# Patient Record
Sex: Male | Born: 1945
Health system: Southern US, Community
[De-identification: ages and names within clinical notes are randomized; demographics above are authoritative.]

## PROBLEM LIST (undated history)

## (undated) DIAGNOSIS — K219 Gastro-esophageal reflux disease without esophagitis: Secondary | ICD-10-CM

## (undated) DIAGNOSIS — J40 Bronchitis, not specified as acute or chronic: Secondary | ICD-10-CM

## (undated) DIAGNOSIS — E119 Type 2 diabetes mellitus without complications: Secondary | ICD-10-CM

## (undated) DIAGNOSIS — I10 Essential (primary) hypertension: Secondary | ICD-10-CM

## (undated) HISTORY — PX: CATARACT EXTRACTION, BILATERAL: SHX1313

---

## 2003-11-27 ENCOUNTER — Emergency Department (HOSPITAL_COMMUNITY): Admission: EM | Admit: 2003-11-27 | Discharge: 2003-11-28 | Payer: Self-pay | Admitting: Emergency Medicine

## 2005-02-24 ENCOUNTER — Ambulatory Visit (HOSPITAL_COMMUNITY): Admission: RE | Admit: 2005-02-24 | Discharge: 2005-02-24 | Payer: Self-pay | Admitting: Orthopaedic Surgery

## 2005-03-08 ENCOUNTER — Encounter: Admission: RE | Admit: 2005-03-08 | Discharge: 2005-03-08 | Payer: Self-pay | Admitting: Orthopaedic Surgery

## 2005-10-25 ENCOUNTER — Emergency Department (HOSPITAL_COMMUNITY): Admission: EM | Admit: 2005-10-25 | Discharge: 2005-10-25 | Payer: Self-pay | Admitting: Emergency Medicine

## 2006-08-30 ENCOUNTER — Emergency Department (HOSPITAL_COMMUNITY): Admission: EM | Admit: 2006-08-30 | Discharge: 2006-08-30 | Payer: Self-pay | Admitting: Emergency Medicine

## 2008-12-29 ENCOUNTER — Emergency Department (HOSPITAL_COMMUNITY): Admission: EM | Admit: 2008-12-29 | Discharge: 2008-12-30 | Payer: Self-pay | Admitting: Emergency Medicine

## 2009-03-25 ENCOUNTER — Emergency Department (HOSPITAL_COMMUNITY): Admission: EM | Admit: 2009-03-25 | Discharge: 2009-03-25 | Payer: Self-pay | Admitting: Emergency Medicine

## 2009-04-02 ENCOUNTER — Emergency Department (HOSPITAL_COMMUNITY): Admission: EM | Admit: 2009-04-02 | Discharge: 2009-04-02 | Payer: Self-pay | Admitting: Emergency Medicine

## 2010-08-24 ENCOUNTER — Encounter: Payer: Self-pay | Admitting: Orthopaedic Surgery

## 2010-11-08 LAB — BRAIN NATRIURETIC PEPTIDE: Pro B Natriuretic peptide (BNP): 37 pg/mL (ref 0.0–100.0)

## 2010-11-08 LAB — URINALYSIS, ROUTINE W REFLEX MICROSCOPIC
Glucose, UA: NEGATIVE mg/dL
Hgb urine dipstick: NEGATIVE
Ketones, ur: 15 mg/dL — AB
Protein, ur: NEGATIVE mg/dL

## 2010-11-08 LAB — POCT CARDIAC MARKERS
CKMB, poc: <1 ng/mL — ABNORMAL LOW (ref 1.0–8.0)
CKMB, poc: <1 ng/mL — ABNORMAL LOW (ref 1.0–8.0)
Myoglobin, poc: 39 ng/mL (ref 12–200)
Troponin i, poc: <0.05 ng/mL (ref 0.00–0.09)

## 2010-11-08 LAB — DIFFERENTIAL
Basophils Absolute: 0 10*3/uL (ref 0.0–0.1)
Basophils Relative: 0 % (ref 0–1)
Lymphocytes Relative: 19 % (ref 12–46)
Lymphs Abs: 1.3 10*3/uL (ref 0.7–4.0)
Neutro Abs: 5.1 10*3/uL (ref 1.7–7.7)
Neutrophils Relative %: 75 % (ref 43–77)

## 2010-11-08 LAB — COMPREHENSIVE METABOLIC PANEL
ALT: 15 U/L (ref 0–53)
Albumin: 3.8 g/dL (ref 3.5–5.2)
Alkaline Phosphatase: 61 U/L (ref 39–117)
Calcium: 8.9 mg/dL (ref 8.4–10.5)
Chloride: 105 mEq/L (ref 96–112)
GFR calc non Af Amer: >60 mL/min (ref >60)
Total Bilirubin: 1.1 mg/dL (ref 0.3–1.2)
Total Protein: 7.7 g/dL (ref 6.0–8.3)

## 2010-11-08 LAB — CBC
Hemoglobin: 12.5 g/dL — ABNORMAL LOW (ref 13.0–17.0)
MCHC: 34.1 g/dL (ref 30.0–36.0)
MCV: 96.1 fL (ref 78.0–100.0)
WBC: 6.9 10*3/uL (ref 4.0–10.5)

## 2010-11-08 LAB — URINE CULTURE: Colony Count: NO GROWTH

## 2012-08-08 ENCOUNTER — Ambulatory Visit (INDEPENDENT_AMBULATORY_CARE_PROVIDER_SITE_OTHER): Payer: Medicare Other | Admitting: Family Medicine

## 2012-08-08 VITALS — BP 155/80 | HR 83 | Temp 98.0°F | Resp 16

## 2012-08-08 DIAGNOSIS — I10 Essential (primary) hypertension: Secondary | ICD-10-CM

## 2012-08-08 DIAGNOSIS — J40 Bronchitis, not specified as acute or chronic: Secondary | ICD-10-CM

## 2012-08-08 DIAGNOSIS — K219 Gastro-esophageal reflux disease without esophagitis: Secondary | ICD-10-CM | POA: Insufficient documentation

## 2012-08-08 MED ORDER — AZITHROMYCIN 200 MG/5ML PO SUSR: ORAL | Status: DC

## 2012-08-08 NOTE — Patient Instructions (Addendum)
Please let us know if you are not better in the next few days- Sooner if worse.

## 2012-08-08 NOTE — Progress Notes (Signed)
[facility] [address], [place] [address] [phone]  Date:  08/08/2012   Name:  Ethan Pierce   DOB:  02-13-1946   MRN:  [id number]  PCP:  No primary provider on file.    Chief Complaint: Cough and Nasal Congestion   History of Present Illness:  Ethan Pierce is a 67 y.o. very pleasant male patient who presents with the following:  Here today with illness.  He has noted a cough and thinks he may have bronchitis.  He has had the cough for about a week.   He does not feel bad overall, but his back may hurt when he coughs.   No fever, chills or other body aches.   He does have some nasal congestion and is using an OTC nasal spray.  No earache or ST.    He has tried some OTC medications but they have not helped much so far.    He would like a PSA test as there is a family history- however he plans to have this done at the [facility] next week.  He gets most of her regular care at the [facility].    There is no problem list on file for this patient.   History reviewed. No pertinent past medical history.  History reviewed. No pertinent past surgical history.  History  Substance Use Topics  . Smoking status: Never Smoker   . Smokeless tobacco: Not on file  . Alcohol Use: No    Family History  Problem Relation Age of Onset  . Diabetes Mother   . GER disease Mother   . Cancer Father     pancreas  . Diabetes Sister   . Diabetes Brother     Allergies  Allergen Reactions  . Penicillins   . Talwin (Pentazocine) Other (See Comments)    Elevated blood pressure.    Medication list has been reviewed and updated.  Current Outpatient Prescriptions on File Prior to Visit  Medication Sig Dispense Refill  . lansoprazole (PREVACID SOLUTAB) 15 MG disintegrating tablet Take 15 mg by mouth 2 (two) times daily.      Marland Kitchen lisinopril (PRINIVIL,ZESTRIL) 40 MG tablet Take 40 mg by mouth daily.        Review of Systems:  As per HPI- otherwise negative.   Physical  Examination: Filed Vitals:   08/08/12 0906  BP: 172/74  Pulse: 83  Temp: 98 F (36.7 C)  Resp: 16   There were no vitals filed for this visit. There is no height or weight on file to calculate BMI. Ideal Body Weight:    GEN: WDWN, NAD, Non-toxic, A & O x 3 HEENT: Atraumatic, Normocephalic. Neck supple. No masses, No LAD. Bilateral TM wnl, oropharynx normal.  PEERL,EOMI.   Ears and Nose: No external deformity. CV: RRR, No M/G/R. No JVD. No thrill. No extra heart sounds. PULM: CTA B, no wheezes, crackles, rhonchi. No retractions. No resp. distress. No accessory muscle use. EXTR: No c/c/e NEURO Normal gait.  PSYCH: Normally interactive. Conversant. Not depressed or anxious appearing.  Calm demeanor.    Assessment and Plan: 1. Bronchitis  azithromycin (ZITHROMAX) 200 MG/5ML suspension  2. HTN (hypertension)    3. GERD (gastroesophageal reflux disease)     Will treat for bronchitis as above- he states that he cannot take pills easily, so will use azithromycin liquid  Meds ordered this encounter  Medications  . lisinopril (PRINIVIL,ZESTRIL) 40 MG tablet    Sig: Take 40 mg  by mouth daily.  . lansoprazole (PREVACID SOLUTAB) 15 MG disintegrating tablet    Sig: Take 15 mg by mouth 2 (two) times daily.  Marland Kitchen azithromycin (ZITHROMAX) 200 MG/5ML suspension    Sig: 500 mg day 1 (12.5 ml), then 250 mg days 2-5 (6.25 ml)    Dispense:  40 mL    Refill:  0   Let me know if not better in the next few days.    Abbe Amsterdam, MD

## 2012-09-09 ENCOUNTER — Emergency Department (HOSPITAL_COMMUNITY): Payer: Medicare Other

## 2012-09-09 ENCOUNTER — Encounter (HOSPITAL_COMMUNITY): Payer: Self-pay | Admitting: Emergency Medicine

## 2012-09-09 ENCOUNTER — Emergency Department (HOSPITAL_COMMUNITY)
Admission: EM | Admit: 2012-09-09 | Discharge: 2012-09-09 | Disposition: A | Discharge status: Home / Self Care | Payer: Medicare Other | Attending: Emergency Medicine | Admitting: Emergency Medicine

## 2012-09-09 DIAGNOSIS — K219 Gastro-esophageal reflux disease without esophagitis: Secondary | ICD-10-CM | POA: Insufficient documentation

## 2012-09-09 DIAGNOSIS — M545 Low back pain, unspecified: Secondary | ICD-10-CM | POA: Insufficient documentation

## 2012-09-09 DIAGNOSIS — N200 Calculus of kidney: Secondary | ICD-10-CM

## 2012-09-09 DIAGNOSIS — K5730 Diverticulosis of large intestine without perforation or abscess without bleeding: Secondary | ICD-10-CM | POA: Insufficient documentation

## 2012-09-09 DIAGNOSIS — Z79899 Other long term (current) drug therapy: Secondary | ICD-10-CM | POA: Insufficient documentation

## 2012-09-09 DIAGNOSIS — I10 Essential (primary) hypertension: Secondary | ICD-10-CM | POA: Insufficient documentation

## 2012-09-09 DIAGNOSIS — R112 Nausea with vomiting, unspecified: Secondary | ICD-10-CM | POA: Insufficient documentation

## 2012-09-09 DIAGNOSIS — E119 Type 2 diabetes mellitus without complications: Secondary | ICD-10-CM | POA: Insufficient documentation

## 2012-09-09 HISTORY — DX: Type 2 diabetes mellitus without complications: E11.9

## 2012-09-09 HISTORY — DX: Diverticulosis of large intestine without perforation or abscess without bleeding: K57.30

## 2012-09-09 HISTORY — DX: Essential (primary) hypertension: I10

## 2012-09-09 HISTORY — DX: Gastro-esophageal reflux disease without esophagitis: K21.9

## 2012-09-09 LAB — BASIC METABOLIC PANEL
Calcium: 8.7 mg/dL (ref 8.4–10.5)
Creatinine, Ser: 0.85 mg/dL (ref 0.50–1.35)
GFR calc Af Amer: >90 mL/min (ref >90)
GFR calc non Af Amer: 89 mL/min — ABNORMAL LOW (ref >90)
Glucose, Bld: 210 mg/dL — ABNORMAL HIGH (ref 70–99)
Potassium: 3.6 mEq/L (ref 3.5–5.1)
Sodium: 136 mEq/L (ref 135–145)

## 2012-09-09 LAB — CBC WITH DIFFERENTIAL/PLATELET
Basophils Absolute: 0 10*3/uL (ref 0.0–0.1)
Basophils Relative: 1 % (ref 0–1)
Eosinophils Absolute: 0.2 10*3/uL (ref 0.0–0.7)
MCH: 32.4 pg (ref 26.0–34.0)
MCHC: 35 g/dL (ref 30.0–36.0)
MCV: 92.7 fL (ref 78.0–100.0)
Monocytes Relative: 7 % (ref 3–12)
Neutro Abs: 3.8 10*3/uL (ref 1.7–7.7)
Neutrophils Relative %: 58 % (ref 43–77)
RDW: 12.4 % (ref 11.5–15.5)
WBC: 6.6 10*3/uL (ref 4.0–10.5)

## 2012-09-09 LAB — URINALYSIS, ROUTINE W REFLEX MICROSCOPIC
Leukocytes, UA: NEGATIVE
pH: 5.5 (ref 5.0–8.0)

## 2012-09-09 MED ORDER — PROMETHAZINE HCL 25 MG PO TABS: 25.0000 mg | ORAL_TABLET | Freq: Four times a day (QID) | ORAL | Status: DC | PRN

## 2012-09-09 MED ORDER — TAMSULOSIN HCL 0.4 MG PO CAPS: 0.4000 mg | ORAL_CAPSULE | Freq: Two times a day (BID) | ORAL | Status: DC

## 2012-09-09 MED ORDER — ONDANSETRON 4 MG PO TBDP
8.0000 mg | ORAL_TABLET | Freq: Once | ORAL | Status: AC
  Administered 2012-09-09: 8 mg via ORAL
  Filled 2012-09-09: qty 2

## 2012-09-09 MED ORDER — HYDROCODONE-ACETAMINOPHEN 5-325 MG PO TABS: 2.0000 | ORAL_TABLET | ORAL | Status: DC | PRN

## 2012-09-09 MED ORDER — NAPROXEN 500 MG PO TABS: 500.0000 mg | ORAL_TABLET | Freq: Two times a day (BID) | ORAL | Status: DC

## 2012-09-09 NOTE — ED Notes (Signed)
Pt reports left flank pain after lifting a box; pt states he started having some nausea and vomiting after this incident;

## 2012-09-09 NOTE — ED Provider Notes (Signed)
History     CSN: [id number]  Arrival date & time 09/09/12  1538   First MD Initiated Contact with Patient 09/09/12 1649      Chief Complaint  Patient presents with  . Flank Pain    (Consider location/radiation/quality/duration/timing/severity/associated sxs/prior treatment) HPI Comments: 67 year old male with a history of hypertension and diabetes presents with a complaint of left lower back pain that radiates to the left flank and groin. He states this started today, it was gradual in onset, it has been persistent, he seems to think that it is worse when he is standing, better when he is resting on his back. He does have associated nausea and vomiting occasionally with this. He denies hematuria, dysuria and has never had a kidney stone. He has never had any abdominal surgery.  Patient is a 67 y.o. male presenting with flank pain. The history is provided by the patient and a relative.  Flank Pain    Past Medical History  Diagnosis Date  . Hypertension   . Diabetes mellitus without complication   . GERD (gastroesophageal reflux disease)     History reviewed. No pertinent past surgical history.  Family History  Problem Relation Age of Onset  . Diabetes Mother   . GER disease Mother   . Cancer Father     pancreas  . Diabetes Sister   . Diabetes Brother     History  Substance Use Topics  . Smoking status: Never Smoker   . Smokeless tobacco: Not on file  . Alcohol Use: No      Review of Systems  Genitourinary: Positive for flank pain.  All other systems reviewed and are negative.    Allergies  Penicillins and Talwin  Home Medications   Current Outpatient Rx  Name  Route  Sig  Dispense  Refill  . ENSURE PLUS PO LIQD   Oral   Take 237 mLs by mouth 3 (three) times daily between meals.         Marland Kitchen LANSOPRAZOLE 15 MG PO TBDP   Oral   Take 15 mg by mouth 2 (two) times daily.         Marland Kitchen LISINOPRIL 40 MG PO TABS   Oral   Take 40 mg by mouth daily as needed.  For hypertension         . SODIUM CHLORIDE 0.65 % NA SOLN   Nasal   Place 1 spray into the nose as needed. For nasal congestion         . HYDROCODONE-ACETAMINOPHEN 5-325 MG PO TABS   Oral   Take 2 tablets by mouth every 4 (four) hours as needed for pain.   15 tablet   0   . NAPROXEN 500 MG PO TABS   Oral   Take 1 tablet (500 mg total) by mouth 2 (two) times daily with a meal.   30 tablet   0   . PROMETHAZINE HCL 25 MG PO TABS   Oral   Take 1 tablet (25 mg total) by mouth every 6 (six) hours as needed for nausea.   12 tablet   0   . TAMSULOSIN HCL 0.4 MG PO CAPS   Oral   Take 1 capsule (0.4 mg total) by mouth 2 (two) times daily.   10 capsule   0     BP 164/70  Pulse 58  Temp 98.4 F (36.9 C) (Oral)  Resp 18  SpO2 97%  Physical Exam  Nursing note and vitals reviewed. Constitutional: He  appears well-developed and well-nourished. No distress.  HENT:  Head: Normocephalic and atraumatic.  Mouth/Throat: Oropharynx is clear and moist. No oropharyngeal exudate.  Eyes: Conjunctivae normal and EOM are normal. Pupils are equal, round, and reactive to light. Right eye exhibits no discharge. Left eye exhibits no discharge. No scleral icterus.  Neck: Normal range of motion. Neck supple. No JVD present. No thyromegaly present.  Cardiovascular: Normal rate, regular rhythm, normal heart sounds and intact distal pulses.  Exam reveals no gallop and no friction rub.   No murmur heard. Pulmonary/Chest: Effort normal and breath sounds normal. No respiratory distress. He has no wheezes. He has no rales.  Abdominal: Soft. Bowel sounds are normal. He exhibits no distension and no mass. There is tenderness ( Minimal tenderness to the left lower quadrant, no pulsating masses, no peritoneal signs).  Musculoskeletal: Normal range of motion. He exhibits no edema and no tenderness.  Lymphadenopathy:    He has no cervical adenopathy.  Neurological: He is alert. Coordination normal.  Skin:  Skin is warm and dry. No rash noted. No erythema.  Psychiatric: He has a normal mood and affect. His behavior is normal.    ED Course  Procedures (including critical care time)  Labs Reviewed  CBC WITH DIFFERENTIAL - Abnormal; Notable for the following:    RBC 3.70 (*)     Hemoglobin 12.0 (*)     HCT 34.3 (*)     All other components within normal limits  BASIC METABOLIC PANEL - Abnormal; Notable for the following:    Glucose, Bld 210 (*)     GFR calc non Af Amer 89 (*)     All other components within normal limits  URINALYSIS, ROUTINE W REFLEX MICROSCOPIC   Ct Abdomen Pelvis Wo Contrast  09/09/2012  *RADIOLOGY REPORT*  Clinical Data: Left flank pain and left lower quadrant - left scrotal pain  CT ABDOMEN AND PELVIS WITHOUT CONTRAST  Technique:  Multidetector CT imaging of the abdomen and pelvis was performed following the standard protocol without intravenous contrast.  Comparison: None.  Findings: The lung bases are clear.  The heart is mildly enlarged. The liver is unremarkable in the unenhanced state.  No calcified gallstones are seen within the contracted gallbladder.  The pancreas is normal in size and the pancreatic duct is not dilated. The adrenal glands and spleen are unremarkable.  The stomach is not well distended.  No renal calculi are seen.  There is however slight fullness of the left pelvocaliceal system and left ureter to a point of low grade obstruction by a 3 mm distal left ureteral calculus several centimeters above the expected left UV junction. The right ureter is normal in caliber.  The urinary bladder is unremarkable.  The prostate is normal in size for age.  There are multiple rectosigmoid colonic diverticula present.  No diverticulitis is seen.  The terminal ileum and the appendix are unremarkable.  There is degenerative change throughout the facet joints of the lower lumbar spine.  IMPRESSION:  1.  Low grade obstruction of the left collecting system by a 3 mm distal left  ureteral calculus several centimeters above the left UV junction. 2.  No additional renal calculi are seen. 3.  Multiple rectosigmoid colonic diverticula.   Original Report Authenticated By: Dwyane Dee, M.D.      1. Kidney stone on left side       MDM  The patient is well-appearing, he does not have any signs of hernia in his groins, I suspect  he may have a kidney stone, would also consider diverticulitis and less likely an aneurysm. CT scan has been ordered as his lab work is completely unremarkable including no hematuria. He declines pain medications at this   CT scan shows a 3 mm distal UVJ kidney stone causing some obstruction. Renal function is preserved, no leukocytosis, no infection or hematuria. The patient appears stable, will discharge the     Vida Roller, MD 09/09/12 [phone]

## 2012-09-09 NOTE — ED Notes (Signed)
Pt c/o lower back pain with radiation to left flank and groin area with N/V

## 2012-09-09 NOTE — ED Notes (Signed)
Pt given discharge paperwork; pt verbalized understanding of discharge and f/u; no additional questions by pt; VSS;

## 2012-11-09 ENCOUNTER — Ambulatory Visit: Payer: Non-veteran care | Attending: Internal Medicine | Admitting: Physical Therapy

## 2012-11-09 DIAGNOSIS — IMO0001 Reserved for inherently not codable concepts without codable children: Secondary | ICD-10-CM | POA: Insufficient documentation

## 2012-11-09 DIAGNOSIS — M545 Low back pain, unspecified: Secondary | ICD-10-CM | POA: Insufficient documentation

## 2012-11-09 DIAGNOSIS — M25659 Stiffness of unspecified hip, not elsewhere classified: Secondary | ICD-10-CM | POA: Insufficient documentation

## 2012-11-16 ENCOUNTER — Ambulatory Visit: Payer: Non-veteran care | Admitting: Physical Therapy

## 2012-11-21 ENCOUNTER — Ambulatory Visit: Payer: Non-veteran care | Admitting: Physical Therapy

## 2012-11-23 ENCOUNTER — Ambulatory Visit: Payer: Non-veteran care | Admitting: Physical Therapy

## 2012-12-06 ENCOUNTER — Ambulatory Visit: Payer: Non-veteran care | Attending: Internal Medicine | Admitting: Physical Therapy

## 2012-12-06 DIAGNOSIS — M545 Low back pain, unspecified: Secondary | ICD-10-CM | POA: Insufficient documentation

## 2012-12-06 DIAGNOSIS — IMO0001 Reserved for inherently not codable concepts without codable children: Secondary | ICD-10-CM | POA: Insufficient documentation

## 2012-12-06 DIAGNOSIS — M25659 Stiffness of unspecified hip, not elsewhere classified: Secondary | ICD-10-CM | POA: Insufficient documentation

## 2012-12-08 ENCOUNTER — Ambulatory Visit: Payer: Non-veteran care | Admitting: Physical Therapy

## 2012-12-13 ENCOUNTER — Ambulatory Visit: Payer: Non-veteran care | Admitting: Physical Therapy

## 2012-12-15 ENCOUNTER — Ambulatory Visit: Payer: Non-veteran care | Admitting: Physical Therapy

## 2012-12-21 ENCOUNTER — Ambulatory Visit: Payer: Non-veteran care | Admitting: Physical Therapy

## 2012-12-23 ENCOUNTER — Ambulatory Visit: Payer: Non-veteran care | Admitting: Physical Therapy

## 2013-01-02 ENCOUNTER — Ambulatory Visit: Payer: Non-veteran care | Attending: Internal Medicine | Admitting: Physical Therapy

## 2013-01-02 DIAGNOSIS — M25659 Stiffness of unspecified hip, not elsewhere classified: Secondary | ICD-10-CM | POA: Insufficient documentation

## 2013-01-02 DIAGNOSIS — M545 Low back pain, unspecified: Secondary | ICD-10-CM | POA: Insufficient documentation

## 2013-01-02 DIAGNOSIS — IMO0001 Reserved for inherently not codable concepts without codable children: Secondary | ICD-10-CM | POA: Insufficient documentation

## 2013-01-05 ENCOUNTER — Ambulatory Visit: Payer: Non-veteran care | Admitting: Physical Therapy

## 2013-01-17 ENCOUNTER — Ambulatory Visit: Payer: Non-veteran care | Admitting: Physical Therapy

## 2013-11-26 ENCOUNTER — Encounter (HOSPITAL_COMMUNITY): Payer: Self-pay | Admitting: Emergency Medicine

## 2013-11-26 ENCOUNTER — Emergency Department (HOSPITAL_COMMUNITY): Payer: Medicare Other

## 2013-11-26 ENCOUNTER — Emergency Department (HOSPITAL_COMMUNITY)
Admission: EM | Admit: 2013-11-26 | Discharge: 2013-11-26 | Disposition: A | Discharge status: Home / Self Care | Payer: Medicare Other | Attending: Emergency Medicine | Admitting: Emergency Medicine

## 2013-11-26 DIAGNOSIS — K219 Gastro-esophageal reflux disease without esophagitis: Secondary | ICD-10-CM | POA: Diagnosis not present

## 2013-11-26 DIAGNOSIS — Z791 Long term (current) use of non-steroidal anti-inflammatories (NSAID): Secondary | ICD-10-CM | POA: Diagnosis not present

## 2013-11-26 DIAGNOSIS — E119 Type 2 diabetes mellitus without complications: Secondary | ICD-10-CM | POA: Insufficient documentation

## 2013-11-26 DIAGNOSIS — Z79899 Other long term (current) drug therapy: Secondary | ICD-10-CM | POA: Diagnosis not present

## 2013-11-26 DIAGNOSIS — Z880 Allergy status to penicillin: Secondary | ICD-10-CM | POA: Insufficient documentation

## 2013-11-26 DIAGNOSIS — J019 Acute sinusitis, unspecified: Secondary | ICD-10-CM | POA: Diagnosis not present

## 2013-11-26 DIAGNOSIS — I10 Essential (primary) hypertension: Secondary | ICD-10-CM | POA: Diagnosis present

## 2013-11-26 LAB — COMPREHENSIVE METABOLIC PANEL
ALK PHOS: 88 U/L (ref 39–117)
ALT: 12 U/L (ref 0–53)
AST: 18 U/L (ref 0–37)
Albumin: 3.5 g/dL (ref 3.5–5.2)
BILIRUBIN TOTAL: 0.6 mg/dL (ref 0.3–1.2)
BUN: 13 mg/dL (ref 6–23)
CHLORIDE: 102 meq/L (ref 96–112)
CO2: 26 meq/L (ref 19–32)
Calcium: 8.8 mg/dL (ref 8.4–10.5)
Creatinine, Ser: 0.99 mg/dL (ref 0.50–1.35)
GFR calc Af Amer: >90 mL/min (ref >90)
GFR calc non Af Amer: 83 mL/min — ABNORMAL LOW (ref >90)
GLUCOSE: 201 mg/dL — AB (ref 70–99)
POTASSIUM: 3.8 meq/L (ref 3.7–5.3)
Sodium: 140 mEq/L (ref 137–147)
Total Protein: 7.8 g/dL (ref 6.0–8.3)

## 2013-11-26 LAB — CBC WITH DIFFERENTIAL/PLATELET
BASOS PCT: 1 % (ref 0–1)
Basophils Absolute: 0 10*3/uL (ref 0.0–0.1)
Eosinophils Absolute: 0.4 10*3/uL (ref 0.0–0.7)
Eosinophils Relative: 6 % — ABNORMAL HIGH (ref 0–5)
HEMATOCRIT: 37.5 % — AB (ref 39.0–52.0)
HEMOGLOBIN: 12.6 g/dL — AB (ref 13.0–17.0)
LYMPHS PCT: 27 % (ref 12–46)
Lymphs Abs: 1.9 10*3/uL (ref 0.7–4.0)
MCH: 31.3 pg (ref 26.0–34.0)
MCHC: 33.6 g/dL (ref 30.0–36.0)
MCV: 93.3 fL (ref 78.0–100.0)
MONOS PCT: 10 % (ref 3–12)
Monocytes Absolute: 0.7 10*3/uL (ref 0.1–1.0)
NEUTROS ABS: 3.9 10*3/uL (ref 1.7–7.7)
Neutrophils Relative %: 56 % (ref 43–77)
Platelets: 220 10*3/uL (ref 150–400)
RBC: 4.02 MIL/uL — AB (ref 4.22–5.81)
RDW: 12.7 % (ref 11.5–15.5)
WBC: 6.8 10*3/uL (ref 4.0–10.5)

## 2013-11-26 LAB — TROPONIN I: Troponin I: <0.3 ng/mL (ref <0.30)

## 2013-11-26 MED ORDER — BENZONATATE 100 MG PO CAPS: 100.0000 mg | ORAL_CAPSULE | Freq: Three times a day (TID) | ORAL | Status: DC

## 2013-11-26 MED ORDER — SULFAMETHOXAZOLE-TMP DS 800-160 MG PO TABS: 1.0000 | ORAL_TABLET | Freq: Two times a day (BID) | ORAL | Status: DC

## 2013-11-26 NOTE — ED Provider Notes (Signed)
CSN: [id number]     Arrival date & time 11/26/13  [id number] History   First MD Initiated Contact with Patient 11/26/13 [phone]     Chief Complaint  Patient presents with  . Hypertension     (Consider location/radiation/quality/duration/timing/severity/associated sxs/prior Treatment) Patient is a 68 y.o. male presenting with hypertension. The history is provided by the patient. No language interpreter was used.  Hypertension This is a new problem. The current episode started today. The problem occurs constantly. The problem has been unchanged. Associated symptoms include congestion and coughing. Pertinent negatives include no nausea. Nothing aggravates the symptoms. He has tried nothing for the symptoms. The treatment provided mild relief.    Past Medical History  Diagnosis Date  . Hypertension   . Diabetes mellitus without complication   . GERD (gastroesophageal reflux disease)    History reviewed. No pertinent past surgical history. Family History  Problem Relation Age of Onset  . Diabetes Mother   . GER disease Mother   . Cancer Father     pancreas  . Diabetes Sister   . Diabetes Brother    History  Substance Use Topics  . Smoking status: Never Smoker   . Smokeless tobacco: Not on file  . Alcohol Use: No    Review of Systems  HENT: Positive for congestion.   Respiratory: Positive for cough.   Gastrointestinal: Negative for nausea.  All other systems reviewed and are negative.     Allergies  Penicillins and Talwin  Home Medications   Prior to Admission medications   Medication Sig Start Date End Date Taking? Authorizing Provider  Ensure Plus (ENSURE PLUS) LIQD Take 237 mLs by mouth 3 (three) times daily between meals.    Historical Provider, MD  HYDROcodone-acetaminophen (NORCO/VICODIN) 5-325 MG per tablet Take 2 tablets by mouth every 4 (four) hours as needed for pain. 09/09/12   Vida RollerBrian D Miller, MD  lansoprazole (PREVACID SOLUTAB) 15 MG disintegrating tablet Take 15 mg  by mouth 2 (two) times daily.    Historical Provider, MD  lisinopril (PRINIVIL,ZESTRIL) 40 MG tablet Take 40 mg by mouth daily as needed. For hypertension    Historical Provider, MD  naproxen (NAPROSYN) 500 MG tablet Take 1 tablet (500 mg total) by mouth 2 (two) times daily with a meal. 09/09/12   Vida RollerBrian D Miller, MD  promethazine (PHENERGAN) 25 MG tablet Take 1 tablet (25 mg total) by mouth every 6 (six) hours as needed for nausea. 09/09/12   Vida RollerBrian D Miller, MD  sodium chloride (OCEAN) 0.65 % nasal spray Place 1 spray into the nose as needed. For nasal congestion    Historical Provider, MD  Tamsulosin HCl (FLOMAX) 0.4 MG CAPS Take 1 capsule (0.4 mg total) by mouth 2 (two) times daily. 09/09/12   Vida RollerBrian D Miller, MD   BP 191/87  Pulse 109  Temp(Src) 98.5 F (36.9 C) (Oral)  Resp 20  Ht 5\' 8"  (1.727 m)  Wt 180 lb (81.647 kg)  BMI 27.38 kg/m2  SpO2 98% Physical Exam  Nursing note and vitals reviewed. Constitutional: He is oriented to person, place, and time. He appears well-developed and well-nourished.  HENT:  Head: Normocephalic.  Right Ear: External ear normal.  Left Ear: External ear normal.  Mouth/Throat: Oropharynx is clear and moist.  Eyes: Conjunctivae are normal. Pupils are equal, round, and reactive to light.  Neck: Normal range of motion. Neck supple.  Cardiovascular: Normal rate and regular rhythm.   Pulmonary/Chest: Effort normal and breath sounds normal.  Abdominal: Soft.  Bowel sounds are normal.  Musculoskeletal: Normal range of motion.  Neurological: He is alert and oriented to person, place, and time.  Skin: Skin is warm.  Psychiatric: He has a normal mood and affect.    ED Course  Procedures (including critical care time) Labs Review Labs Reviewed  CBC WITH DIFFERENTIAL  COMPREHENSIVE METABOLIC PANEL  TROPONIN I    Imaging Review No results found.   EKG Interpretation   Date/Time:  Sunday November 26 2013 06:10:15 EDT Ventricular Rate:  99 PR Interval:   209 QRS Duration: 100 QT Interval:  380 QTC Calculation: 488 R Axis:   77 Text Interpretation:  Sinus rhythm Borderline prolonged PR interval  Probable left atrial enlargement ECG OTHERWISE WITHIN NORMAL LIMITS since  last tracing no significant change Confirmed by MILLER  MD, BRIAN ([id number])  on 11/26/2013 6:18:35 AM    . Labs Reviewed  CBC WITH DIFFERENTIAL - Abnormal; Notable for the following:    RBC 4.02 (*)    Hemoglobin 12.6 (*)    HCT 37.5 (*)    Eosinophils Relative 6 (*)    All other components within normal limits  COMPREHENSIVE METABOLIC PANEL - Abnormal; Notable for the following:    Glucose, Bld 201 (*)    GFR calc non Af Amer 83 (*)    All other components within normal limits  TROPONIN I    MDM ct shows sinusitis   Chest xray is normal.  Pt advised to follow up with primary MD.   Stop OTC cough medications.   Rx for zithromax   Final diagnoses:  Acute sinus infection        Elson AreasLeslie K Sofia, PA-C 11/26/13 0750  Lonia SkinnerLeslie K ConcordSofia, New JerseyPA-C 11/26/13 [phone]

## 2013-11-26 NOTE — ED Provider Notes (Signed)
The patient presents with a complaint of hypertension, had taken oral antitussive medication last night, noticed his blood pressure was high this morning, is very concerned because of coughing and brief headache which has now essentially resolved. On exam the patient is alert and oriented, moves all extremities without difficulty, speech is clear, cranial nerves III through XII are intact, heart and lung sounds are normal, abdomen soft and no peripheral edema. I personally seen and interpreted his 2 view PA and lateral views of the chest x-ray showed no signs of pulmonary edema or pulmonary infiltrate, CT scan of the head reveals no signs of intracranial hemorrhage mass or lesions. The patient has some lab work pending, he will be stable for discharge assuming all tests are normal, he is taking his antihypertensives this morning and is aware that he should stay away from over-the-counter medications that may cause increased blood pressure.  Medical screening examination/treatment/procedure(s) were conducted as a shared visit with non-physician practitioner(s) and myself.  I personally evaluated the patient during the encounter.  Clinical Impression: Hypertension      Vida RollerBrian D Cayli Escajeda, MD 11/26/13 [phone]

## 2013-11-26 NOTE — ED Notes (Signed)
Pt states he woke up this morning not feeling well, took blood pressure and it was high, as well as his pulse. Pt states he took his blood pressure pill ( Lisinipril). Pt states he took Robitussin for his bronchitis that he has been having issues all week. Pt denies any pain. Pt states "I don't feel normal".

## 2013-11-26 NOTE — ED Provider Notes (Signed)
Medical screening examination/treatment/procedure(s) were conducted as a shared visit with non-physician practitioner(s) and myself.  I personally evaluated the patient during the encounter  Please see my separate respective documentation pertaining to this patient encounter   Vida RollerBrian D Litisha Guagliardo, MD 11/26/13 2259

## 2013-11-26 NOTE — ED Notes (Signed)
Karen PA at bedside   

## 2013-11-26 NOTE — ED Notes (Signed)
The pt has bronchitis and he has been taking robitussin  His bp is high and he reports that  His heart ahs been beating fast

## 2013-11-26 NOTE — Discharge Instructions (Signed)

## 2014-07-17 ENCOUNTER — Emergency Department (HOSPITAL_COMMUNITY)
Admission: EM | Admit: 2014-07-17 | Discharge: 2014-07-17 | Disposition: A | Discharge status: Home / Self Care | Payer: Medicare Other | Attending: Emergency Medicine | Admitting: Emergency Medicine

## 2014-07-17 ENCOUNTER — Emergency Department (HOSPITAL_COMMUNITY): Payer: Medicare Other

## 2014-07-17 ENCOUNTER — Encounter (HOSPITAL_COMMUNITY): Payer: Self-pay | Admitting: Emergency Medicine

## 2014-07-17 DIAGNOSIS — R05 Cough: Secondary | ICD-10-CM | POA: Diagnosis not present

## 2014-07-17 DIAGNOSIS — I10 Essential (primary) hypertension: Secondary | ICD-10-CM | POA: Diagnosis present

## 2014-07-17 DIAGNOSIS — Z880 Allergy status to penicillin: Secondary | ICD-10-CM | POA: Insufficient documentation

## 2014-07-17 DIAGNOSIS — Z79899 Other long term (current) drug therapy: Secondary | ICD-10-CM | POA: Insufficient documentation

## 2014-07-17 DIAGNOSIS — K219 Gastro-esophageal reflux disease without esophagitis: Secondary | ICD-10-CM | POA: Diagnosis not present

## 2014-07-17 DIAGNOSIS — R059 Cough, unspecified: Secondary | ICD-10-CM

## 2014-07-17 DIAGNOSIS — E119 Type 2 diabetes mellitus without complications: Secondary | ICD-10-CM | POA: Insufficient documentation

## 2014-07-17 DIAGNOSIS — Z792 Long term (current) use of antibiotics: Secondary | ICD-10-CM | POA: Diagnosis not present

## 2014-07-17 LAB — COMPREHENSIVE METABOLIC PANEL
ALBUMIN: 3.7 g/dL (ref 3.5–5.2)
ALK PHOS: 87 U/L (ref 39–117)
ALT: 14 U/L (ref 0–53)
AST: 19 U/L (ref 0–37)
Anion gap: 13 (ref 5–15)
BILIRUBIN TOTAL: 0.4 mg/dL (ref 0.3–1.2)
BUN: 11 mg/dL (ref 6–23)
CO2: 28 mEq/L (ref 19–32)
CREATININE: 0.95 mg/dL (ref 0.50–1.35)
Calcium: 9.3 mg/dL (ref 8.4–10.5)
Chloride: 100 mEq/L (ref 96–112)
GFR calc Af Amer: >90 mL/min (ref >90)
GFR calc non Af Amer: 84 mL/min — ABNORMAL LOW (ref >90)
Glucose, Bld: 172 mg/dL — ABNORMAL HIGH (ref 70–99)
POTASSIUM: 3.4 meq/L — AB (ref 3.7–5.3)
Sodium: 141 mEq/L (ref 137–147)
TOTAL PROTEIN: 8 g/dL (ref 6.0–8.3)

## 2014-07-17 LAB — CBC
HCT: 37.5 % — ABNORMAL LOW (ref 39.0–52.0)
Hemoglobin: 12.2 g/dL — ABNORMAL LOW (ref 13.0–17.0)
MCH: 30 pg (ref 26.0–34.0)
MCHC: 32.5 g/dL (ref 30.0–36.0)
MCV: 92.1 fL (ref 78.0–100.0)
PLATELETS: 225 10*3/uL (ref 150–400)
RBC: 4.07 MIL/uL — ABNORMAL LOW (ref 4.22–5.81)
RDW: 12.7 % (ref 11.5–15.5)
WBC: 8.8 10*3/uL (ref 4.0–10.5)

## 2014-07-17 LAB — TROPONIN I: Troponin I: <0.3 ng/mL (ref <0.30)

## 2014-07-17 LAB — PRO B NATRIURETIC PEPTIDE: PRO B NATRI PEPTIDE: 14.5 pg/mL (ref 0–125)

## 2014-07-17 NOTE — ED Provider Notes (Addendum)
CSN: [id number]     Arrival date & time 07/17/14  0350 History   First MD Initiated Contact with Patient 07/17/14 [phone]     Chief Complaint  Patient presents with  . Hypertension  . Shortness of Breath     HPI Pt woke up with a choking episode tonight.  He was coughing. He does have acid reflux. When he woke up he didn't feel right so he checked his bp.  It was elevated in the 200s.  He did take his blood pressure medicine before coming in.  He has noticed that lying flat bothers him.  He is not sure if it is because of the gerd or something else.     Right now he is feeling better.  No CP.  No SOB at this time.  No leg swelling.  He has had some dry coughing recently. Past Medical History  Diagnosis Date  . Hypertension   . Diabetes mellitus without complication   . GERD (gastroesophageal reflux disease)    History reviewed. No pertinent past surgical history. Family History  Problem Relation Age of Onset  . Diabetes Mother   . GER disease Mother   . Cancer Father     pancreas  . Diabetes Sister   . Diabetes Brother    History  Substance Use Topics  . Smoking status: Never Smoker   . Smokeless tobacco: Never Used  . Alcohol Use: No    Review of Systems  All other systems reviewed and are negative.     Allergies  Penicillins; Decongestant; and Talwin  Home Medications   Prior to Admission medications   Medication Sig Start Date End Date Taking? Authorizing Provider  citalopram (CELEXA) 20 MG tablet Take 20 mg by mouth daily as needed (for anxiety).   Yes Historical Provider, MD  Ensure Plus (ENSURE PLUS) LIQD Take 237 mLs by mouth every other day.    Yes Historical Provider, MD  lansoprazole (PREVACID SOLUTAB) 15 MG disintegrating tablet Take 15 mg by mouth daily.    Yes Historical Provider, MD  lisinopril-hydrochlorothiazide (PRINZIDE,ZESTORETIC) 20-25 MG per tablet Take 1 tablet by mouth daily.   Yes Historical Provider, MD  PARoxetine (PAXIL) 20 MG tablet Take 20  mg by mouth daily.   Yes Historical Provider, MD  PRESCRIPTION MEDICATION Take 1 tablet by mouth daily. For bladder control   Yes Historical Provider, MD  Probiotic Product (ALIGN) 4 MG CAPS Take 4 mg by mouth daily.   Yes Historical Provider, MD  traZODone (DESYREL) 100 MG tablet Take 100 mg by mouth at bedtime as needed for sleep.   Yes Historical Provider, MD  benzonatate (TESSALON) 100 MG capsule Take 1 capsule (100 mg total) by mouth every 8 (eight) hours. Patient not taking: Reported on 07/17/2014 11/26/13   Elson AreasLeslie K Sofia, PA-C  citalopram (CELEXA) 20 MG tablet Take 20 mg by mouth daily.    Historical Provider, MD  guaifenesin (ROBITUSSIN) 100 MG/5ML syrup Take 200 mg by mouth 3 (three) times daily as needed for cough.    Historical Provider, MD  lisinopril (PRINIVIL,ZESTRIL) 40 MG tablet Take 40 mg by mouth daily as needed. For hypertension    Historical Provider, MD  sulfamethoxazole-trimethoprim (BACTRIM DS) 800-160 MG per tablet Take 1 tablet by mouth 2 (two) times daily. Patient not taking: Reported on 07/17/2014 11/26/13   Elson AreasLeslie K Sofia, PA-C   BP 128/60 mmHg  Pulse 73  Temp(Src) 98.3 F (36.8 C) (Oral)  Resp 13  Ht 5\' 8"  (  1.727 m)  Wt 180 lb (81.647 kg)  BMI 27.38 kg/m2  SpO2 97% Physical Exam  Constitutional: He appears well-developed and well-nourished. No distress.  HENT:  Head: Normocephalic and atraumatic.  Right Ear: External ear normal.  Left Ear: External ear normal.  Eyes: Conjunctivae are normal. Right eye exhibits no discharge. Left eye exhibits no discharge. No scleral icterus.  Neck: Neck supple. No tracheal deviation present.  Cardiovascular: Normal rate, regular rhythm and intact distal pulses.   Pulmonary/Chest: Effort normal and breath sounds normal. No stridor. No respiratory distress. He has no wheezes. He has no rales.  Abdominal: Soft. Bowel sounds are normal. He exhibits no distension. There is no tenderness. There is no rebound and no guarding.   Musculoskeletal: He exhibits no edema or tenderness.  Neurological: He is alert. He has normal strength. No cranial nerve deficit (no facial droop, extraocular movements intact, no slurred speech) or sensory deficit. He exhibits normal muscle tone. He displays no seizure activity. Coordination normal.  Skin: Skin is warm and dry. No rash noted.  Psychiatric: He has a normal mood and affect.  Nursing note and vitals reviewed.   ED Course  Procedures (including critical care time) Labs Review Labs Reviewed  CBC - Abnormal; Notable for the following:    RBC 4.07 (*)    Hemoglobin 12.2 (*)    HCT 37.5 (*)    All other components within normal limits  COMPREHENSIVE METABOLIC PANEL - Abnormal; Notable for the following:    Potassium 3.4 (*)    Glucose, Bld 172 (*)    GFR calc non Af Amer 84 (*)    All other components within normal limits  PRO B NATRIURETIC PEPTIDE  TROPONIN I    Imaging Review Dg Chest 2 View  07/17/2014   CLINICAL DATA:  Hypertensive since 3 a.m.  EXAM: CHEST  2 VIEW  COMPARISON:  11/26/2013  FINDINGS: The heart size and mediastinal contours are within normal limits. Both lungs are clear. The visualized skeletal structures are unremarkable.  IMPRESSION: No active cardiopulmonary disease.   Electronically Signed   By: Alcide CleverMark  Lukens M.D.   On: 07/17/2014 07:16     EKG Interpretation   Date/Time:  Tuesday July 17 2014 03:55:01 EST Ventricular Rate:  102 PR Interval:  192 QRS Duration: 98 QT Interval:  366 QTC Calculation: 477 R Axis:   85 Text Interpretation:  Sinus tachycardia Otherwise normal ECG Since last  tracing rate faster Confirmed by Hermann Dottavio  MD-J, Dejon Jungman ([id number]) on 07/17/2014  4:18:24 AM      MDM   Final diagnoses:  Cough    Pt has had recent coughing congestion.  Concerned about his elevated BP but here it is significantly better after taking his medications.  No sign of CHF.  Doubt ACS, PE.  Could be related to his GERD.  He did not take 2  tablets as he was supposed to.  Continue his antacids.  Monitor for fever.  At this time there does not appear to be any evidence of an acute emergency medical condition and the patient appears stable for discharge with appropriate outpatient follow up.      Linwood DibblesJon Kristiann Noyce, MD 07/17/14 [phone]

## 2014-07-17 NOTE — Discharge Instructions (Signed)
Cough, Adult   A cough is a reflex. It helps you clear your throat and airways. A cough can help heal your body. A cough can last 2 or 3 weeks (acute) or may last more than 8 weeks (chronic). Some common causes of a cough can include an infection, allergy, or a cold.  HOME CARE  · Only take medicine as told by your doctor.  · If given, take your medicines (antibiotics) as told. Finish them even if you start to feel better.  · Use a cold steam vaporizer or humidifier in your home. This can help loosen thick spit (secretions).  · Sleep so you are almost sitting up (semi-upright). Use pillows to do this. This helps reduce coughing.  · Rest as needed.  · Stop smoking if you smoke.  GET HELP RIGHT AWAY IF:  · You have yellowish-white fluid (pus) in your thick spit.  · Your cough gets worse.  · Your medicine does not reduce coughing, and you are losing sleep.  · You cough up blood.  · You have trouble breathing.  · Your pain gets worse and medicine does not help.  · You have a fever.  MAKE SURE YOU:   · Understand these instructions.  · Will watch your condition.  · Will get help right away if you are not doing well or get worse.  Document Released: 04/02/2011 Document Revised: 12/04/2013 Document Reviewed: 04/02/2011  Exit[employer]® Patient Information ©2015 [employer]. This information is not intended to replace advice given to you by your health care provider. Make sure you discuss any questions you have with your health care provider.

## 2014-07-17 NOTE — ED Notes (Signed)
MD at bedside. 

## 2014-07-17 NOTE — ED Notes (Signed)
Patient transported to X-ray 

## 2014-07-17 NOTE — ED Notes (Signed)
Pt brought to ED by family member c/o SOB and HTN, pt states he woke up today with a bad dry cough and when he check his BP, his SBP was 200's, pt miss his BP medication dose yesterday and he tock Lisinopril 25-20 mg 30 min ago. Pt denies any CP or dizziness at this time.

## 2015-01-12 ENCOUNTER — Emergency Department (HOSPITAL_COMMUNITY): Payer: Non-veteran care

## 2015-01-12 ENCOUNTER — Encounter (HOSPITAL_COMMUNITY): Payer: Self-pay | Admitting: Emergency Medicine

## 2015-01-12 ENCOUNTER — Emergency Department (HOSPITAL_COMMUNITY)
Admission: EM | Admit: 2015-01-12 | Discharge: 2015-01-12 | Disposition: A | Discharge status: Home / Self Care | Payer: Non-veteran care | Attending: Emergency Medicine | Admitting: Emergency Medicine

## 2015-01-12 DIAGNOSIS — R0789 Other chest pain: Secondary | ICD-10-CM

## 2015-01-12 DIAGNOSIS — E1165 Type 2 diabetes mellitus with hyperglycemia: Secondary | ICD-10-CM | POA: Diagnosis not present

## 2015-01-12 DIAGNOSIS — R05 Cough: Secondary | ICD-10-CM | POA: Insufficient documentation

## 2015-01-12 DIAGNOSIS — K219 Gastro-esophageal reflux disease without esophagitis: Secondary | ICD-10-CM | POA: Diagnosis not present

## 2015-01-12 DIAGNOSIS — Z880 Allergy status to penicillin: Secondary | ICD-10-CM | POA: Diagnosis not present

## 2015-01-12 DIAGNOSIS — Z8709 Personal history of other diseases of the respiratory system: Secondary | ICD-10-CM | POA: Insufficient documentation

## 2015-01-12 DIAGNOSIS — R059 Cough, unspecified: Secondary | ICD-10-CM

## 2015-01-12 DIAGNOSIS — R0602 Shortness of breath: Secondary | ICD-10-CM | POA: Diagnosis not present

## 2015-01-12 DIAGNOSIS — Z79899 Other long term (current) drug therapy: Secondary | ICD-10-CM | POA: Insufficient documentation

## 2015-01-12 DIAGNOSIS — D649 Anemia, unspecified: Secondary | ICD-10-CM | POA: Insufficient documentation

## 2015-01-12 DIAGNOSIS — I10 Essential (primary) hypertension: Secondary | ICD-10-CM

## 2015-01-12 DIAGNOSIS — R079 Chest pain, unspecified: Secondary | ICD-10-CM | POA: Diagnosis present

## 2015-01-12 HISTORY — DX: Bronchitis, not specified as acute or chronic: J40

## 2015-01-12 LAB — BASIC METABOLIC PANEL
ANION GAP: 9 (ref 5–15)
BUN: 13 mg/dL (ref 6–20)
CO2: 28 mmol/L (ref 22–32)
Calcium: 8.6 mg/dL — ABNORMAL LOW (ref 8.9–10.3)
Chloride: 102 mmol/L (ref 101–111)
Creatinine, Ser: 1.08 mg/dL (ref 0.61–1.24)
GFR calc Af Amer: >60 mL/min (ref >60)
GFR calc non Af Amer: >60 mL/min (ref >60)
Glucose, Bld: 194 mg/dL — ABNORMAL HIGH (ref 65–99)
POTASSIUM: 3.4 mmol/L — AB (ref 3.5–5.1)
Sodium: 139 mmol/L (ref 135–145)

## 2015-01-12 LAB — CBC
HCT: 36.3 % — ABNORMAL LOW (ref 39.0–52.0)
HEMOGLOBIN: 12.1 g/dL — AB (ref 13.0–17.0)
MCH: 30.9 pg (ref 26.0–34.0)
MCHC: 33.3 g/dL (ref 30.0–36.0)
MCV: 92.6 fL (ref 78.0–100.0)
PLATELETS: 216 10*3/uL (ref 150–400)
RBC: 3.92 MIL/uL — ABNORMAL LOW (ref 4.22–5.81)
RDW: 12.8 % (ref 11.5–15.5)
WBC: 6.8 10*3/uL (ref 4.0–10.5)

## 2015-01-12 LAB — I-STAT TROPONIN, ED: Troponin i, poc: 0 ng/mL (ref 0.00–0.08)

## 2015-01-12 LAB — BRAIN NATRIURETIC PEPTIDE: B NATRIURETIC PEPTIDE 5: 15.2 pg/mL (ref 0.0–100.0)

## 2015-01-12 MED ORDER — ASPIRIN 81 MG PO CHEW
244.0000 mg | CHEWABLE_TABLET | Freq: Once | ORAL | Status: AC
  Administered 2015-01-12: 244 mg via ORAL
  Filled 2015-01-12: qty 4

## 2015-01-12 MED ORDER — MORPHINE SULFATE 4 MG/ML IJ SOLN
4.0000 mg | Freq: Once | INTRAMUSCULAR | Status: DC
Start: 2015-01-12 — End: 2015-01-12
  Filled 2015-01-12: qty 1

## 2015-01-12 MED ORDER — NITROGLYCERIN 0.4 MG SL SUBL: 0.4000 mg | SUBLINGUAL_TABLET | SUBLINGUAL | Status: DC | PRN

## 2015-01-12 NOTE — Discharge Instructions (Signed)
Use tylenol or motrin as needed for pain. Use maalox before bed to help with your reflux symptoms which may help with your chest pain. Stay well hydrated, take your regular home medications, and follow up with your regular doctor in one week for recheck of symptoms and ongoing management. Return to the ER for changes or worsening symptoms.   Chest Pain (Nonspecific) It is often hard to give a specific diagnosis for the cause of chest pain. There is always a chance that your pain could be related to something serious, such as a heart attack or a blood clot in the lungs. You need to follow up with your health care provider for further evaluation. CAUSES   Heartburn.  Pneumonia or bronchitis.  Anxiety or stress.  Inflammation around your heart (pericarditis) or lung (pleuritis or pleurisy).  A blood clot in the lung.  A collapsed lung (pneumothorax). It can develop suddenly on its own (spontaneous pneumothorax) or from trauma to the chest.  Shingles infection (herpes zoster virus). The chest wall is composed of bones, muscles, and cartilage. Any of these can be the source of the pain.  The bones can be bruised by injury.  The muscles or cartilage can be strained by coughing or overwork.  The cartilage can be affected by inflammation and become sore (costochondritis). DIAGNOSIS  Lab tests or other studies may be needed to find the cause of your pain. Your health care provider may have you take a test called an ambulatory electrocardiogram (ECG). An ECG records your heartbeat patterns over a 24-hour period. You may also have other tests, such as:  Transthoracic echocardiogram (TTE). During echocardiography, sound waves are used to evaluate how blood flows through your heart.  Transesophageal echocardiogram (TEE).  Cardiac monitoring. This allows your health care provider to monitor your heart rate and rhythm in real time.  Holter monitor. This is a portable device that records your  heartbeat and can help diagnose heart arrhythmias. It allows your health care provider to track your heart activity for several days, if needed.  Stress tests by exercise or by giving medicine that makes the heart beat faster. TREATMENT   Treatment depends on what may be causing your chest pain. Treatment may include:  Acid blockers for heartburn.  Anti-inflammatory medicine.  Pain medicine for inflammatory conditions.  Antibiotics if an infection is present.  You may be advised to change lifestyle habits. This includes stopping smoking and avoiding alcohol, caffeine, and chocolate.  You may be advised to keep your head raised (elevated) when sleeping. This reduces the chance of acid going backward from your stomach into your esophagus. Most of the time, nonspecific chest pain will improve within 2-3 days with rest and mild pain medicine.  HOME CARE INSTRUCTIONS   If antibiotics were prescribed, take them as directed. Finish them even if you start to feel better.  For the next few days, avoid physical activities that bring on chest pain. Continue physical activities as directed.  Do not use any tobacco products, including cigarettes, chewing tobacco, or electronic cigarettes.  Avoid drinking alcohol.  Only take medicine as directed by your health care provider.  Follow your health care provider's suggestions for further testing if your chest pain does not go away.  Keep any follow-up appointments you made. If you do not go to an appointment, you could develop lasting (chronic) problems with pain. If there is any problem keeping an appointment, call to reschedule. SEEK MEDICAL CARE IF:   Your chest pain  does not go away, even after treatment.  You have a rash with blisters on your chest.  You have a fever. SEEK IMMEDIATE MEDICAL CARE IF:   You have increased chest pain or pain that spreads to your arm, neck, jaw, back, or abdomen.  You have shortness of breath.  You have  an increasing cough, or you cough up blood.  You have severe back or abdominal pain.  You feel nauseous or vomit.  You have severe weakness.  You faint.  You have chills. This is an emergency. Do not wait to see if the pain will go away. Get medical help at once. Call your local emergency services (911 in [place] Do not drive yourself to the hospital. MAKE SURE YOU:   Understand these instructions.  Will watch your condition.  Will get help right away if you are not doing well or get worse. Document Released: 04/29/2005 Document Revised: 07/25/2013 Document Reviewed: 02/23/2008 [facility] Patient Information 2015 [place], [place]. This information is not intended to replace advice given to you by your health care provider. Make sure you discuss any questions you have with your health care provider.  Cough, Adult  A cough is a reflex. It helps you clear your throat and airways. A cough can help heal your body. A cough can last 2 or 3 weeks (acute) or may last more than 8 weeks (chronic). Some common causes of a cough can include an infection, allergy, or a cold. HOME CARE  Only take medicine as told by your doctor.  If given, take your medicines (antibiotics) as told. Finish them even if you start to feel better.  Use a cold steam vaporizer or humidifier in your home. This can help loosen thick spit (secretions).  Sleep so you are almost sitting up (semi-upright). Use pillows to do this. This helps reduce coughing.  Rest as needed.  Stop smoking if you smoke. GET HELP RIGHT AWAY IF:  You have yellowish-white fluid (pus) in your thick spit.  Your cough gets worse.  Your medicine does not reduce coughing, and you are losing sleep.  You cough up blood.  You have trouble breathing.  Your pain gets worse and medicine does not help.  You have a fever. MAKE SURE YOU:   Understand these instructions.  Will watch your condition.  Will get help right away if you are not  doing well or get worse. Document Released: 04/02/2011 Document Revised: 12/04/2013 Document Reviewed: 04/02/2011 [facility] Patient Information 2015 [place], [place]. This information is not intended to replace advice given to you by your health care provider. Make sure you discuss any questions you have with your health care provider.  DASH Eating Plan DASH stands for "Dietary Approaches to Stop Hypertension." The DASH eating plan is a healthy eating plan that has been shown to reduce high blood pressure (hypertension). Additional health benefits may include reducing the risk of type 2 diabetes mellitus, heart disease, and stroke. The DASH eating plan may also help with weight loss. WHAT DO I NEED TO KNOW ABOUT THE DASH EATING PLAN? For the DASH eating plan, you will follow these general guidelines:  Choose foods with a percent daily value for sodium of less than 5% (as listed on the food label).  Use salt-free seasonings or herbs instead of table salt or sea salt.  Check with your health care provider or pharmacist before using salt substitutes.  Eat lower-sodium products, often labeled as "lower sodium" or "no salt added."  Eat fresh foods.  Eat more vegetables, fruits, and low-fat dairy products.  Choose whole grains. Look for the word "whole" as the first word in the ingredient list.  Choose fish and skinless chicken or [place] more often than red meat. Limit fish, poultry, and meat to 6 oz (170 g) each day.  Limit sweets, desserts, sugars, and sugary drinks.  Choose heart-healthy fats.  Limit cheese to 1 oz (28 g) per day.  Eat more home-cooked food and less restaurant, buffet, and fast food.  Limit fried foods.  Cook foods using methods other than frying.  Limit canned vegetables. If you do use them, rinse them well to decrease the sodium.  When eating at a restaurant, ask that your food be prepared with less salt, or no salt if possible. WHAT FOODS CAN I EAT? Seek help  from a dietitian for individual calorie needs. Grains Whole grain or whole wheat bread. Brown rice. Whole grain or whole wheat pasta. Quinoa, bulgur, and whole grain cereals. Low-sodium cereals. Corn or whole wheat flour tortillas. Whole grain cornbread. Whole grain crackers. Low-sodium crackers. Vegetables Fresh or frozen vegetables (raw, steamed, roasted, or grilled). Low-sodium or reduced-sodium tomato and vegetable juices. Low-sodium or reduced-sodium tomato sauce and paste. Low-sodium or reduced-sodium canned vegetables.  Fruits All fresh, canned (in natural juice), or frozen fruits. Meat and Other Protein Products Ground beef (85% or leaner), grass-fed beef, or beef trimmed of fat. Skinless chicken or [place]. Ground chicken or [place]. Pork trimmed of fat. All fish and seafood. Eggs. Dried beans, peas, or lentils. Unsalted nuts and seeds. Unsalted canned beans. Dairy Low-fat dairy products, such as skim or 1% milk, 2% or reduced-fat cheeses, low-fat ricotta or cottage cheese, or plain low-fat yogurt. Low-sodium or reduced-sodium cheeses. Fats and Oils Tub margarines without trans fats. Light or reduced-fat mayonnaise and salad dressings (reduced sodium). Avocado. Safflower, olive, or canola oils. Natural peanut or almond butter. Other Unsalted popcorn and pretzels. The items listed above may not be a complete list of recommended foods or beverages. Contact your dietitian for more options. WHAT FOODS ARE NOT RECOMMENDED? Grains White bread. White pasta. White rice. Refined cornbread. Bagels and croissants. Crackers that contain trans fat. Vegetables Creamed or fried vegetables. Vegetables in a cheese sauce. Regular canned vegetables. Regular canned tomato sauce and paste. Regular tomato and vegetable juices. Fruits Dried fruits. Canned fruit in light or heavy syrup. Fruit juice. Meat and Other Protein Products Fatty cuts of meat. Ribs, chicken wings, bacon, sausage, bologna, salami,  chitterlings, fatback, hot dogs, bratwurst, and packaged luncheon meats. Salted nuts and seeds. Canned beans with salt. Dairy Whole or 2% milk, cream, half-and-half, and cream cheese. Whole-fat or sweetened yogurt. Full-fat cheeses or blue cheese. Nondairy creamers and whipped toppings. Processed cheese, cheese spreads, or cheese curds. Condiments Onion and garlic salt, seasoned salt, table salt, and sea salt. Canned and packaged gravies. Worcestershire sauce. Tartar sauce. Barbecue sauce. Teriyaki sauce. Soy sauce, including reduced sodium. Steak sauce. Fish sauce. Oyster sauce. Cocktail sauce. Horseradish. Ketchup and mustard. Meat flavorings and tenderizers. Bouillon cubes. Hot sauce. Tabasco sauce. Marinades. Taco seasonings. Relishes. Fats and Oils Butter, stick margarine, lard, shortening, ghee, and bacon fat. Coconut, palm kernel, or palm oils. Regular salad dressings. Other Pickles and olives. Salted popcorn and pretzels. The items listed above may not be a complete list of foods and beverages to avoid. Contact your dietitian for more information. WHERE CAN I FIND MORE INFORMATION? National Heart, Lung, and Blood Institute: [employer] Document Released: 07/09/2011 Document Revised: 12/04/2013 Document  Reviewed: 05/24/2013 [employer] Patient Information 2015 [place], [place]. This information is not intended to replace advice given to you by your health care provider. Make sure you discuss any questions you have with your health care provider.  Food Choices for Gastroesophageal Reflux Disease When you have gastroesophageal reflux disease (GERD), the foods you eat and your eating habits are very important. Choosing the right foods can help ease your discomfort.  WHAT GUIDELINES DO I NEED TO FOLLOW?   Choose fruits, vegetables, whole grains, and low-fat dairy products.   Choose low-fat meat, fish, and poultry.  Limit fats such as oils, salad  dressings, butter, nuts, and avocado.   Keep a food diary. This helps you identify foods that cause symptoms.   Avoid foods that cause symptoms. These may be different for everyone.   Eat small meals often instead of 3 large meals a day.   Eat your meals slowly, in a place where you are relaxed.   Limit fried foods.   Cook foods using methods other than frying.   Avoid drinking alcohol.   Avoid drinking large amounts of liquids with your meals.   Avoid bending over or lying down until 2-3 hours after eating.  WHAT FOODS ARE NOT RECOMMENDED?  These are some foods and drinks that may make your symptoms worse: Vegetables Tomatoes. Tomato juice. Tomato and spaghetti sauce. Chili peppers. Onion and garlic. Horseradish. Fruits Oranges, grapefruit, and lemon (fruit and juice). Meats High-fat meats, fish, and poultry. This includes hot dogs, ribs, ham, sausage, salami, and bacon. Dairy Whole milk and chocolate milk. Sour cream. Cream. Butter. Ice cream. Cream cheese.  Drinks Coffee and tea. Bubbly (carbonated) drinks or energy drinks. Condiments Hot sauce. Barbecue sauce.  Sweets/Desserts Chocolate and cocoa. Donuts. Peppermint and spearmint. Fats and Oils High-fat foods. This includes [place] fries and potato chips. Other Vinegar. Strong spices. This includes black pepper, white pepper, red pepper, cayenne, curry powder, cloves, ginger, and chili powder. The items listed above may not be a complete list of foods and drinks to avoid. Contact your dietitian for more information. Document Released: 01/19/2012 Document Revised: 07/25/2013 Document Reviewed: 05/24/2013 [facility] Patient Information 2015 [place], [place]. This information is not intended to replace advice given to you by your health care provider. Make sure you discuss any questions you have with your health care provider.

## 2015-01-12 NOTE — ED Notes (Signed)
Patient transported to X-ray 

## 2015-01-12 NOTE — ED Notes (Signed)
18g IV previously placed has been removed. Catheter intact. Site clean, dry and intact.

## 2015-01-12 NOTE — ED Notes (Signed)
Pt. reports mid chest pressure onset this morning with SOB and elevated blood pressure this morning 180/88 , denies emesis or diaphoresis .

## 2015-01-12 NOTE — ED Provider Notes (Signed)
CSN: [id number]     Arrival date & time 01/12/15  0540 History   First MD Initiated Contact with Patient 01/12/15 [phone]     Chief Complaint  Patient presents with  . Chest Pain  . Hypertension     (Consider location/radiation/quality/duration/timing/severity/associated sxs/prior Treatment) HPI Comments: Ethan Pierce is a 69 y.o. male with a PMHx of HTN, DM2, GERD, and chronic bronchitis, who presents to the ED with complaints of gradual onset chest pain 3 days. He states this pain is 3/10 constant "heaviness" located in the central sternal area, nonradiating, worse at night when he is not using his see Pap, and unrelieved with a 1 mg aspirin taken this morning. He has "some shortness of breath" and a dry nonproductive cough. He admits that his GERD has been acting up more at night leading to him using one more pillow at night, but denies that the extra pillow is used for shortness of breath. He denies any fevers, chills, diaphoresis, lightheadedness or dizziness, wheezing, leg swelling, hemoptysis, recent travel/surgery/immobilization, history of DVT/PE, abdominal pain, nausea, vomiting, diarrhea, constipation, dysuria, hematuria, numbness, tingling, weakness, claudication, or orthopnea. Nonsmoker. No significant family history of cardiac disease.  The history is provided by the patient. No language interpreter was used.    Past Medical History  Diagnosis Date  . Hypertension   . Diabetes mellitus without complication   . GERD (gastroesophageal reflux disease)   . Bronchitis    History reviewed. No pertinent past surgical history. Family History  Problem Relation Age of Onset  . Diabetes Mother   . GER disease Mother   . Cancer Father     pancreas  . Diabetes Sister   . Diabetes Brother    History  Substance Use Topics  . Smoking status: Never Smoker   . Smokeless tobacco: Never Used  . Alcohol Use: No    Review of Systems  Constitutional: Negative for fever, chills and  diaphoresis.  Respiratory: Positive for cough (dry nonproductive) and shortness of breath. Negative for wheezing.   Cardiovascular: Positive for chest pain. Negative for leg swelling.  Gastrointestinal: Negative for nausea, vomiting, abdominal pain, diarrhea and constipation.  Genitourinary: Negative for dysuria and hematuria.  Musculoskeletal: Negative for myalgias and arthralgias.  Skin: Negative for color change.  Neurological: Negative for weakness and numbness.  Psychiatric/Behavioral: Negative for confusion.   10 Systems reviewed and are negative for acute change except as noted in the HPI.    Allergies  Penicillins; Decongestant; and Talwin  Home Medications   Prior to Admission medications   Medication Sig Start Date End Date Taking? Authorizing Provider  benzonatate (TESSALON) 100 MG capsule Take 1 capsule (100 mg total) by mouth every 8 (eight) hours. Patient not taking: Reported on 07/17/2014 11/26/13   Elson Areas, PA-C  citalopram (CELEXA) 20 MG tablet Take 20 mg by mouth daily.    Historical Provider, MD  citalopram (CELEXA) 20 MG tablet Take 20 mg by mouth daily as needed (for anxiety).    Historical Provider, MD  Ensure Plus (ENSURE PLUS) LIQD Take 237 mLs by mouth every other day.     Historical Provider, MD  guaifenesin (ROBITUSSIN) 100 MG/5ML syrup Take 200 mg by mouth 3 (three) times daily as needed for cough.    Historical Provider, MD  lansoprazole (PREVACID SOLUTAB) 15 MG disintegrating tablet Take 15 mg by mouth daily.     Historical Provider, MD  lisinopril (PRINIVIL,ZESTRIL) 40 MG tablet Take 40 mg by mouth daily as needed.  For hypertension    Historical Provider, MD  lisinopril-hydrochlorothiazide (PRINZIDE,ZESTORETIC) 20-25 MG per tablet Take 1 tablet by mouth daily.    Historical Provider, MD  PARoxetine (PAXIL) 20 MG tablet Take 20 mg by mouth daily.    Historical Provider, MD  PRESCRIPTION MEDICATION Take 1 tablet by mouth daily. For bladder control     Historical Provider, MD  Probiotic Product (ALIGN) 4 MG CAPS Take 4 mg by mouth daily.    Historical Provider, MD  sulfamethoxazole-trimethoprim (BACTRIM DS) 800-160 MG per tablet Take 1 tablet by mouth 2 (two) times daily. Patient not taking: Reported on 07/17/2014 11/26/13   Elson Areas, PA-C  traZODone (DESYREL) 100 MG tablet Take 100 mg by mouth at bedtime as needed for sleep.    Historical Provider, MD   BP 161/74 mmHg  Pulse 95  Temp(Src) 98.5 F (36.9 C) (Oral)  Resp 16  SpO2 99% Physical Exam  Constitutional: He is oriented to person, place, and time. Vital signs are normal. He appears well-developed and well-nourished.  Non-toxic appearance. No distress.  Afebrile, nontoxic, NAD  HENT:  Head: Normocephalic and atraumatic.  Mouth/Throat: Oropharynx is clear and moist and mucous membranes are normal.  Eyes: Conjunctivae and EOM are normal. Right eye exhibits no discharge. Left eye exhibits no discharge.  Neck: Normal range of motion. Neck supple. No JVD present.  No JVD  Cardiovascular: Normal rate, regular rhythm, normal heart sounds and intact distal pulses.  Exam reveals no gallop and no friction rub.   No murmur heard. RRR, nl s1/s2, no m/r/g, distal pulses intact, no pedal edema   Pulmonary/Chest: Effort normal and breath sounds normal. No respiratory distress. He has no decreased breath sounds. He has no wheezes. He has no rhonchi. He has no rales. He exhibits tenderness. He exhibits no crepitus, no deformity and no retraction.    CTAB in all lung fields, no w/r/r, no hypoxia or increased WOB, speaking in full sentences, SpO2 99% on RA  Chest wall with TTP anteriorly along sternum, no crepitus or retraction, no deformity  Abdominal: Soft. Normal appearance and bowel sounds are normal. He exhibits no distension. There is no tenderness. There is no rigidity and no guarding.  Musculoskeletal: Normal range of motion.  MAE x4 Strength and sensation grossly intact Distal  pulses intact No pedal edema, neg homan's bilaterally   Neurological: He is alert and oriented to person, place, and time. He has normal strength. No sensory deficit.  Skin: Skin is warm, dry and intact. No rash noted.  Psychiatric: He has a normal mood and affect.  Nursing note and vitals reviewed.   ED Course  Procedures (including critical care time) Labs Review Labs Reviewed  CBC - Abnormal; Notable for the following:    RBC 3.92 (*)    Hemoglobin 12.1 (*)    HCT 36.3 (*)    All other components within normal limits  BASIC METABOLIC PANEL - Abnormal; Notable for the following:    Potassium 3.4 (*)    Glucose, Bld 194 (*)    Calcium 8.6 (*)    All other components within normal limits  BRAIN NATRIURETIC PEPTIDE  I-STAT TROPOININ, ED    Imaging Review Dg Chest 2 View  01/12/2015   CLINICAL DATA:  Midsternal chest heaviness and dyspnea for 2 days  EXAM: CHEST  2 VIEW  COMPARISON:  07/17/2014  FINDINGS: The heart size and mediastinal contours are within normal limits. Both lungs are clear. The visualized skeletal structures are unremarkable.  IMPRESSION: No active cardiopulmonary disease.   Electronically Signed   By: Ellery Plunk M.D.   On: 01/12/2015 06:48     EKG Interpretation   Date/Time:  Saturday January 12 2015 05:48:34 EDT Ventricular Rate:  88 PR Interval:  225 QRS Duration: 107 QT Interval:  406 QTC Calculation: 491 R Axis:   77 Text Interpretation:  Age not entered, assumed to be  69 years old for  purpose of ECG interpretation Sinus rhythm Prolonged PR interval Probable  left atrial enlargement Borderline prolonged QT interval Confirmed by  Ranae Palms  MD, DAVID ([id number]) on 01/12/2015 6:46:08 AM      MDM   Final diagnoses:  Atypical chest pain  HTN (hypertension), benign  SOB (shortness of breath)  Gastroesophageal reflux disease, esophagitis presence not specified  Anemia, unspecified anemia type  Cough  Hyperglycemia due to type 2 diabetes  mellitus    69 y.o. male here with 3 days of CP. Reproducible on exam. No tachycardic or hypoxia, no pleuritic component to CP, doubt PE. No exertional component, gets worse at night and although he admits that his GERD has been acting up at night, he refuses to take GI cocktail. He also refuses NTG. Will give ASA and morphine. EKG nonischemic. Trop WNL. Will await labs and reassess.  8:33 AM Pain mildly improved. I believe this is likely GERD related but pt refused to take GI cocktail and refused morphine. Labs unremarkable aside from chronic anemia and mild hyperglycemia without anion gap. BNP WNL. CXR clear. Given that this pain is reproducible and atypical, and pt with few risk factors, doubt need for admission since this pain is ongoing x3 days with neg troponin. Will have him try maalox at night to help with his GERD which may help his symptoms. Will have him f/up with his PCP for ongoing cardiac evaluation. He states he had an echo recently which was normal. I explained the diagnosis and have given explicit precautions to return to the ER including for any other new or worsening symptoms. The patient understands and accepts the medical plan as it's been dictated and I have answered their questions. Discharge instructions concerning home care and prescriptions have been given. The patient is STABLE and is discharged to home in good condition.  BP 161/74 mmHg  Pulse 95  Temp(Src) 98.5 F (36.9 C) (Oral)  Resp 16  SpO2 99%  Meds ordered this encounter  Medications  . aspirin chewable tablet 244 mg    Sig:        Meagon Duskin Camprubi-Soms, PA-C 01/12/15 9811  Loren Racer, MD 01/13/15 [phone]

## 2015-10-02 ENCOUNTER — Ambulatory Visit: Payer: Non-veteran care | Admitting: Family Medicine

## 2015-10-10 ENCOUNTER — Ambulatory Visit: Payer: Non-veteran care | Admitting: Family Medicine

## 2016-03-23 MED FILL — PIOGLITAZONE HCL 15 MG TAB: 15 | 30 days supply | Qty: 30 | Fill #0

## 2016-04-10 MED FILL — MECLIZINE 25 MG TABLET: 25 | 30 days supply | Qty: 120 | Fill #0

## 2016-07-02 MED FILL — LIVALO 4 MG TABLET: 4 | 30 days supply | Qty: 30 | Fill #0

## 2016-07-02 MED FILL — GLIMEPIRIDE 1 MG TABLET: 1 | 30 days supply | Qty: 45 | Fill #0

## 2016-10-06 ENCOUNTER — Ambulatory Visit (INDEPENDENT_AMBULATORY_CARE_PROVIDER_SITE_OTHER): Payer: Non-veteran care | Admitting: Sports Medicine

## 2016-10-06 DIAGNOSIS — L853 Xerosis cutis: Secondary | ICD-10-CM

## 2016-10-06 DIAGNOSIS — E1142 Type 2 diabetes mellitus with diabetic polyneuropathy: Secondary | ICD-10-CM | POA: Diagnosis not present

## 2016-10-06 DIAGNOSIS — M2141 Flat foot [pes planus] (acquired), right foot: Secondary | ICD-10-CM

## 2016-10-06 DIAGNOSIS — M79674 Pain in right toe(s): Secondary | ICD-10-CM

## 2016-10-06 DIAGNOSIS — M2142 Flat foot [pes planus] (acquired), left foot: Secondary | ICD-10-CM | POA: Diagnosis not present

## 2016-10-06 DIAGNOSIS — M79675 Pain in left toe(s): Secondary | ICD-10-CM | POA: Diagnosis not present

## 2016-10-06 DIAGNOSIS — B351 Tinea unguium: Secondary | ICD-10-CM | POA: Diagnosis not present

## 2016-10-06 NOTE — Progress Notes (Signed)
Subjective: Ethan Pierce is a 71 y.o. male patient with history of diabetes who presents to office today complaining of long, painful nails while ambulating in shoes; unable to trim. Patient states that the glucose reading this morning was not recorded; last a1c 7.1 Patient denies any new changes in medication or new problems. Patient denies any new cramping, numbness, burning or tingling in the legs. Admits to baseline neuropathy since service 1967.  States that he desires new diabetic shoes and insoles.  Patient Active Problem List   Diagnosis Date Noted  . HTN (hypertension) 08/08/2012  . GERD (gastroesophageal reflux disease) 08/08/2012   Current Outpatient Prescriptions on File Prior to Visit  Medication Sig Dispense Refill  . citalopram (CELEXA) 20 MG tablet Take 20 mg by mouth daily as needed (for anxiety).    Marland Kitchen. PARoxetine (PAXIL) 20 MG tablet Take 40 mg by mouth daily.     . traZODone (DESYREL) 100 MG tablet Take 100 mg by mouth at bedtime as needed for sleep.     No current facility-administered medications on file prior to visit.    Allergies  Allergen Reactions  . Penicillins Anaphylaxis  . Decongestant [Pseudoephedrine Hcl Er] Other (See Comments)    Elevated blood pressure   . Talwin [Pentazocine] Other (See Comments)    Elevated blood pressure.    No results found for this or any previous visit (from the past 2160 hour(s)).  Objective: General: Patient is awake, alert, and oriented x 3 and in no acute distress.  Integument: Skin is warm, dry and supple bilateral. Nails are tender, long, thickened and dystrophic with subungual debris, consistent with onychomycosis, 1-5 bilateral. No signs of infection. No open lesions or preulcerative lesions present bilateral. Remaining integument unremarkable.  Vasculature:  Dorsalis Pedis pulse 1/4 bilateral. Posterior Tibial pulse  1/4 bilateral. Capillary fill time <3 sec 1-5 bilateral. Positive hair growth to the level of the  digits. Temperature gradient within normal limits. No varicosities present bilateral. No edema present bilateral.   Neurology: The patient has intact sensation measured with a 5.07/10g Semmes Weinstein Monofilament at all pedal sites bilateral. Vibratory sensation diminished bilateral with tuning fork. No Babinski sign present bilateral.   Musculoskeletal: Asymptomatic pes planus pedal deformities noted bilateral. Muscular strength 5/5 in all lower extremity muscular groups bilateral without pain on range of motion. No tenderness with calf compression bilateral.  Assessment and Plan: Problem List Items Addressed This Visit    None    Visit Diagnoses    Dermatophytosis of nail    -  Primary   Diabetic polyneuropathy associated with type 2 diabetes mellitus (HCC)       Relevant Medications   atorvastatin (LIPITOR) 80 MG tablet   Pes planus of both feet       Toe pain, bilateral       Dry skin         -Examined patient -Discussed and educated patient on diabetic foot care, especially with regards to the vascular, neurological and musculoskeletal systems.  -Stressed the importance of good glycemic control and the detriment of not controlling glucose levels in relation to the foot. -Mechanically debrided all nails 1-5 bilateral using sterile nail nipper and filed with dremel without incident  -Recommend okeeffe healthy  -Patient desires new diabetic shoes and insoles; [facility]  -Answered all patient questions -Patient to return in 3 months for at risk foot care -Patient advised to call the office if any problems or questions arise in  the meantime.  Asencion Islamitorya Burdette Forehand, DPM

## 2016-10-06 NOTE — Patient Instructions (Addendum)

## 2017-01-05 ENCOUNTER — Ambulatory Visit (INDEPENDENT_AMBULATORY_CARE_PROVIDER_SITE_OTHER): Payer: Non-veteran care | Admitting: Sports Medicine

## 2017-01-05 ENCOUNTER — Encounter: Payer: Self-pay | Admitting: Sports Medicine

## 2017-01-05 DIAGNOSIS — E1142 Type 2 diabetes mellitus with diabetic polyneuropathy: Secondary | ICD-10-CM

## 2017-01-05 DIAGNOSIS — B351 Tinea unguium: Secondary | ICD-10-CM

## 2017-01-05 DIAGNOSIS — M79674 Pain in right toe(s): Secondary | ICD-10-CM

## 2017-01-05 DIAGNOSIS — M79675 Pain in left toe(s): Secondary | ICD-10-CM | POA: Diagnosis not present

## 2017-01-05 DIAGNOSIS — M2141 Flat foot [pes planus] (acquired), right foot: Secondary | ICD-10-CM

## 2017-01-05 DIAGNOSIS — M2142 Flat foot [pes planus] (acquired), left foot: Secondary | ICD-10-CM

## 2017-01-05 NOTE — Progress Notes (Signed)
Subjective: Ethan Pierce is a 71 y.o. male patient with history of diabetes who presents to office today complaining of long, painful nails while ambulating in shoes; unable to trim. Patient states that the glucose reading this morning was not recorded; last a1c 6.8. Patient denies any new changes in medication or new problems. Patient denies any new cramping, numbness, burning or tingling in the legs. Admits he is still awaiting new diabetic shoes and insoles.   Patient Active Problem List   Diagnosis Date Noted  . HTN (hypertension) 08/08/2012  . GERD (gastroesophageal reflux disease) 08/08/2012   Current Outpatient Prescriptions on File Prior to Visit  Medication Sig Dispense Refill  . atorvastatin (LIPITOR) 80 MG tablet Take 80 mg by mouth daily.    Marland Kitchen BISACODYL PO Take by mouth.    . citalopram (CELEXA) 20 MG tablet Take 20 mg by mouth daily as needed (for anxiety).    . Cyanocobalamin (VITAMIN B 12 PO) Take by mouth.    . fluticasone (FLONASE) 50 MCG/ACT nasal spray Place into both nostrils daily.    Marland Kitchen PARoxetine (PAXIL) 20 MG tablet Take 40 mg by mouth daily.     . traZODone (DESYREL) 100 MG tablet Take 100 mg by mouth at bedtime as needed for sleep.    . TRIAMTERENE-HCTZ PO Take by mouth.     No current facility-administered medications on file prior to visit.    Allergies  Allergen Reactions  . Penicillins Anaphylaxis  . Decongestant [Pseudoephedrine Hcl Er] Other (See Comments)    Elevated blood pressure   . Talwin [Pentazocine] Other (See Comments)    Elevated blood pressure.    No results found for this or any previous visit (from the past 2160 hour(s)).  Objective: General: Patient is awake, alert, and oriented x 3 and in no acute distress.  Integument: Skin is warm, dry and supple bilateral. Nails are tender, long, thickened and dystrophic with subungual debris, consistent with onychomycosis, 1-5 bilateral. No signs of infection. No open lesions or preulcerative  lesions present bilateral. Remaining integument unremarkable.  Vasculature:  Dorsalis Pedis pulse 1/4 bilateral. Posterior Tibial pulse  1/4 bilateral. Capillary fill time <3 sec 1-5 bilateral. Positive hair growth to the level of the digits. Temperature gradient within normal limits. No varicosities present bilateral. No edema present bilateral.   Neurology: The patient has intact sensation measured with a 5.07/10g Semmes Weinstein Monofilament at all pedal sites bilateral. Vibratory sensation diminished bilateral with tuning fork. No Babinski sign present bilateral.   Musculoskeletal: Asymptomatic pes planus pedal deformities noted bilateral. Muscular strength 5/5 in all lower extremity muscular groups bilateral without pain on range of motion. No tenderness with calf compression bilateral.  Assessment and Plan: Problem List Items Addressed This Visit    None    Visit Diagnoses    Dermatophytosis of nail    -  Primary   Diabetic polyneuropathy associated with type 2 diabetes mellitus (HCC)       Toe pain, bilateral       Pes planus of both feet         -Examined patient -Discussed and educated patient on diabetic foot care, especially with regards to the vascular, neurological and musculoskeletal systems.  -Stressed the importance of good glycemic control and the detriment of not controlling glucose levels in relation to the foot. -Mechanically debrided all nails 1-5 bilateral using sterile nail nipper and filed with dremel without incident  -Recommend continue with okeeffe healthy  -Patient desires new diabetic shoes  and insoles; [facility] [place]  -Answered all patient questions -Patient to return in 3 months for at risk foot care -Patient advised to call the office if any problems or questions arise in the meantime.  Asencion Islamitorya Sherl Yzaguirre, DPM

## 2017-03-16 ENCOUNTER — Ambulatory Visit: Payer: Non-veteran care | Admitting: Sports Medicine

## 2017-04-16 DIAGNOSIS — M25561 Pain in right knee: Secondary | ICD-10-CM | POA: Diagnosis not present

## 2017-04-16 DIAGNOSIS — H8103 Meniere's disease, bilateral: Secondary | ICD-10-CM | POA: Diagnosis not present

## 2017-04-16 DIAGNOSIS — I10 Essential (primary) hypertension: Secondary | ICD-10-CM | POA: Diagnosis not present

## 2017-04-16 DIAGNOSIS — E7800 Pure hypercholesterolemia, unspecified: Secondary | ICD-10-CM | POA: Diagnosis not present

## 2017-05-04 DIAGNOSIS — H8109 Meniere's disease, unspecified ear: Secondary | ICD-10-CM | POA: Diagnosis not present

## 2017-05-04 DIAGNOSIS — R2681 Unsteadiness on feet: Secondary | ICD-10-CM | POA: Diagnosis not present

## 2017-05-04 DIAGNOSIS — M5432 Sciatica, left side: Secondary | ICD-10-CM | POA: Diagnosis not present

## 2017-05-13 DIAGNOSIS — M5432 Sciatica, left side: Secondary | ICD-10-CM | POA: Diagnosis not present

## 2017-05-13 DIAGNOSIS — M47816 Spondylosis without myelopathy or radiculopathy, lumbar region: Secondary | ICD-10-CM | POA: Diagnosis not present

## 2017-05-14 MED FILL — MELOXICAM 15 MG TABLET: 15 | 30 days supply | Qty: 30 | Fill #0

## 2017-05-14 MED FILL — METHOCARBAMOL 500 MG TABLET: 500 | 15 days supply | Qty: 60 | Fill #0

## 2017-06-03 MED FILL — OFLOXACIN 0.3% EYE DROPS: 0.3 | 25 days supply | Qty: 5 | Fill #0

## 2017-06-07 DIAGNOSIS — H25811 Combined forms of age-related cataract, right eye: Secondary | ICD-10-CM | POA: Diagnosis not present

## 2017-06-07 DIAGNOSIS — H25813 Combined forms of age-related cataract, bilateral: Secondary | ICD-10-CM | POA: Diagnosis not present

## 2017-06-08 MED FILL — PREDNISOLONE AC 1% EYE DROP: 1 | 25 days supply | Qty: 10 | Fill #0

## 2017-07-16 DIAGNOSIS — F4310 Post-traumatic stress disorder, unspecified: Secondary | ICD-10-CM | POA: Diagnosis not present

## 2017-07-16 DIAGNOSIS — H8109 Meniere's disease, unspecified ear: Secondary | ICD-10-CM | POA: Diagnosis not present

## 2017-07-16 DIAGNOSIS — E7800 Pure hypercholesterolemia, unspecified: Secondary | ICD-10-CM | POA: Diagnosis not present

## 2017-07-16 DIAGNOSIS — E119 Type 2 diabetes mellitus without complications: Secondary | ICD-10-CM | POA: Diagnosis not present

## 2017-07-16 DIAGNOSIS — I10 Essential (primary) hypertension: Secondary | ICD-10-CM | POA: Diagnosis not present

## 2017-09-01 DIAGNOSIS — H401131 Primary open-angle glaucoma, bilateral, mild stage: Secondary | ICD-10-CM | POA: Diagnosis not present

## 2017-09-01 DIAGNOSIS — H43813 Vitreous degeneration, bilateral: Secondary | ICD-10-CM | POA: Diagnosis not present

## 2017-09-01 DIAGNOSIS — Z961 Presence of intraocular lens: Secondary | ICD-10-CM | POA: Diagnosis not present

## 2017-09-27 DIAGNOSIS — H60333 Swimmer's ear, bilateral: Secondary | ICD-10-CM | POA: Diagnosis not present

## 2017-10-07 DIAGNOSIS — I10 Essential (primary) hypertension: Secondary | ICD-10-CM | POA: Diagnosis not present

## 2017-10-07 DIAGNOSIS — E119 Type 2 diabetes mellitus without complications: Secondary | ICD-10-CM | POA: Diagnosis not present

## 2017-10-14 DIAGNOSIS — E119 Type 2 diabetes mellitus without complications: Secondary | ICD-10-CM | POA: Diagnosis not present

## 2017-10-14 DIAGNOSIS — F4310 Post-traumatic stress disorder, unspecified: Secondary | ICD-10-CM | POA: Diagnosis not present

## 2017-10-14 DIAGNOSIS — E7800 Pure hypercholesterolemia, unspecified: Secondary | ICD-10-CM | POA: Diagnosis not present

## 2017-10-14 DIAGNOSIS — I10 Essential (primary) hypertension: Secondary | ICD-10-CM | POA: Diagnosis not present

## 2018-01-11 DIAGNOSIS — I10 Essential (primary) hypertension: Secondary | ICD-10-CM | POA: Diagnosis not present

## 2018-01-11 DIAGNOSIS — E119 Type 2 diabetes mellitus without complications: Secondary | ICD-10-CM | POA: Diagnosis not present

## 2018-01-17 DIAGNOSIS — E7800 Pure hypercholesterolemia, unspecified: Secondary | ICD-10-CM | POA: Diagnosis not present

## 2018-01-17 DIAGNOSIS — E119 Type 2 diabetes mellitus without complications: Secondary | ICD-10-CM | POA: Diagnosis not present

## 2018-01-17 DIAGNOSIS — D649 Anemia, unspecified: Secondary | ICD-10-CM | POA: Diagnosis not present

## 2018-01-17 DIAGNOSIS — H8109 Meniere's disease, unspecified ear: Secondary | ICD-10-CM | POA: Diagnosis not present

## 2018-01-17 DIAGNOSIS — I10 Essential (primary) hypertension: Secondary | ICD-10-CM | POA: Diagnosis not present

## 2018-01-25 ENCOUNTER — Ambulatory Visit (INDEPENDENT_AMBULATORY_CARE_PROVIDER_SITE_OTHER): Payer: Medicare Other | Admitting: Sports Medicine

## 2018-01-25 ENCOUNTER — Encounter: Payer: Self-pay | Admitting: Sports Medicine

## 2018-01-25 ENCOUNTER — Ambulatory Visit (INDEPENDENT_AMBULATORY_CARE_PROVIDER_SITE_OTHER): Payer: Medicare Other

## 2018-01-25 DIAGNOSIS — E1142 Type 2 diabetes mellitus with diabetic polyneuropathy: Secondary | ICD-10-CM

## 2018-01-25 DIAGNOSIS — M79675 Pain in left toe(s): Secondary | ICD-10-CM

## 2018-01-25 DIAGNOSIS — S99921A Unspecified injury of right foot, initial encounter: Secondary | ICD-10-CM

## 2018-01-25 DIAGNOSIS — B351 Tinea unguium: Secondary | ICD-10-CM

## 2018-01-25 DIAGNOSIS — M79674 Pain in right toe(s): Secondary | ICD-10-CM | POA: Diagnosis not present

## 2018-01-25 NOTE — Progress Notes (Signed)
Subjective: Ethan Pierce is a 72 y.o. male patient with history of diabetes who returns to office today complaining of long, painful nails while ambulating in shoes; unable to trim. Patient states that the glucose reading this morning was not recorded. Patient denies any new changes in medication or new problems. Patient desires new diabetic shoes/insoles. Patient also reports that he bumped his right foot 4 months ago that is slowly getting better but wanted to have his foot checked.No other symptoms at this time.   Patient Active Problem List   Diagnosis Date Noted  . HTN (hypertension) 08/08/2012  . GERD (gastroesophageal reflux disease) 08/08/2012   Current Outpatient Medications on File Prior to Visit  Medication Sig Dispense Refill  . atorvastatin (LIPITOR) 80 MG tablet Take 80 mg by mouth daily.    Marland Kitchen. BISACODYL PO Take by mouth.    . citalopram (CELEXA) 20 MG tablet Take 20 mg by mouth daily as needed (for anxiety).    . Cyanocobalamin (VITAMIN B 12 PO) Take by mouth.    . fluticasone (FLONASE) 50 MCG/ACT nasal spray Place into both nostrils daily.    Marland Kitchen. glimepiride (AMARYL) 4 MG tablet TAKE 1 TABLET BY MOUTH WITH BREAKFAST OR THE FIRST MAIN MEAL OF THE DAY ONCE A DAY (FOR DIABETES)  6  . PARoxetine (PAXIL) 20 MG tablet Take 40 mg by mouth daily.     . traZODone (DESYREL) 100 MG tablet Take 100 mg by mouth at bedtime as needed for sleep.    . TRIAMTERENE-HCTZ PO Take by mouth.     No current facility-administered medications on file prior to visit.    Allergies  Allergen Reactions  . Penicillins Anaphylaxis  . Decongestant [Pseudoephedrine Hcl Er] Other (See Comments)    Elevated blood pressure   . Talwin [Pentazocine] Other (See Comments)    Elevated blood pressure.    No results found for this or any previous visit (from the past 2160 hour(s)).  Objective: General: Patient is awake, alert, and oriented x 3 and in no acute distress.  Integument: Skin is warm, dry and  supple bilateral. Nails are tender, long, thickened and dystrophic with subungual debris, consistent with onychomycosis, 1-5 bilateral. No signs of infection. No open lesions or preulcerative lesions present bilateral. Remaining integument unremarkable.  Vasculature:  Dorsalis Pedis pulse 1/4 bilateral. Posterior Tibial pulse  1/4 bilateral. Capillary fill time <3 sec 1-5 bilateral. Positive hair growth to the level of the digits.Temperature gradient within normal limits. No varicosities present bilateral. No edema present bilateral.   Neurology: The patient has intact sensation measured with a 5.07/10g Semmes Weinstein Monofilament at all pedal sites bilateral. Vibratory sensation diminished bilateral with tuning fork. No Babinski sign present bilateral.   Musculoskeletal: No reproducible pain to right lateral foot, Asymptomatic pes planus pedal deformities noted bilateral. Muscular strength 5/5 in all lower extremity muscular groups bilateral without pain on range of motion. No tenderness with calf compression bilateral.  Xray, Right foot: Arthritis diffusely. No other acute findings.   Assessment and Plan: Problem List Items Addressed This Visit    None    Visit Diagnoses    Injury of right foot, initial encounter    -  Primary   Relevant Orders   DG Foot Complete Right (Completed)   Dermatophytosis of nail       Diabetic polyneuropathy associated with type 2 diabetes mellitus (HCC)       Relevant Medications   glimepiride (AMARYL) 4 MG tablet   Toe pain,  bilateral         -Examined patient -Xray negative right foot -Recommend topical pain cream right foot -Discussed and educated patient on diabetic foot care, especially with regards to the vascular, neurological and musculoskeletal systems.  -Stressed the importance of good glycemic control and the detriment of not controlling glucose levels in relation to the foot. -Mechanically debrided all nails 1-5 bilateral using sterile nail  nipper and filed with dremel without incident  -Recommend continue with okeeffe healthy  -Patient desires new diabetic shoes and insoles; Gave patient Rx to go to [facility] since he is a [place] patient to get Diabetic shoes and insoles  -Patient to return in 6 months for at risk foot care -Patient advised to call the office if any problems or questions arise in the meantime.  Asencion Islam, DPM

## 2018-03-02 ENCOUNTER — Telehealth: Payer: Self-pay | Admitting: Sports Medicine

## 2018-03-02 NOTE — Telephone Encounter (Signed)
[facility] to have [facility] for [facility]25/18 fax[facility] to [phone]> Attn: Rhona

## 2018-03-03 DIAGNOSIS — M5416 Radiculopathy, lumbar region: Secondary | ICD-10-CM | POA: Insufficient documentation

## 2018-05-03 ENCOUNTER — Encounter: Payer: Self-pay | Admitting: Sports Medicine

## 2018-05-03 ENCOUNTER — Ambulatory Visit (INDEPENDENT_AMBULATORY_CARE_PROVIDER_SITE_OTHER): Payer: Medicare Other | Admitting: Sports Medicine

## 2018-05-03 DIAGNOSIS — M79674 Pain in right toe(s): Secondary | ICD-10-CM | POA: Diagnosis not present

## 2018-05-03 DIAGNOSIS — E1142 Type 2 diabetes mellitus with diabetic polyneuropathy: Secondary | ICD-10-CM

## 2018-05-03 DIAGNOSIS — B351 Tinea unguium: Secondary | ICD-10-CM | POA: Diagnosis not present

## 2018-05-03 DIAGNOSIS — L853 Xerosis cutis: Secondary | ICD-10-CM | POA: Diagnosis not present

## 2018-05-03 DIAGNOSIS — M79675 Pain in left toe(s): Secondary | ICD-10-CM

## 2018-05-03 MED ORDER — UREA 20 % EX CREA: TOPICAL_CREAM | Freq: Every day | CUTANEOUS | 5 refills | Status: DC

## 2018-05-03 NOTE — Progress Notes (Addendum)
Subjective: Ethan Pierce is a 72 y.o. male patient with history of diabetes who returns to office today complaining of long, painful nails while ambulating in shoes; unable to trim. Patient states that the glucose reading this morning was not recorded. Patient denies any new changes in medication or new problems. Patient desires new diabetic shoes/insoles and still states that he has not gotten them through the [place] because the last order I sent [facility] and [facility] did not feel the order because it had to come from a special department still with my request. No other symptoms at this time.   Patient Active Problem List   Diagnosis Date Noted  . HTN (hypertension) 08/08/2012  . GERD (gastroesophageal reflux disease) 08/08/2012   Current Outpatient Medications on File Prior to Visit  Medication Sig Dispense Refill  . atorvastatin (LIPITOR) 80 MG tablet Take 80 mg by mouth daily.    Marland Kitchen BISACODYL PO Take by mouth.    . citalopram (CELEXA) 20 MG tablet Take 20 mg by mouth daily as needed (for anxiety).    . Cyanocobalamin (VITAMIN B 12 PO) Take by mouth.    . fluticasone (FLONASE) 50 MCG/ACT nasal spray Place into both nostrils daily.    Marland Kitchen glimepiride (AMARYL) 4 MG tablet TAKE 1 TABLET BY MOUTH WITH BREAKFAST OR THE FIRST MAIN MEAL OF THE DAY ONCE A DAY (FOR DIABETES)  6  . meloxicam (MOBIC) 15 MG tablet meloxicam 15 mg tablet    . methocarbamol (ROBAXIN) 500 MG tablet methocarbamol 500 mg tablet    . ofloxacin (OCUFLOX) 0.3 % ophthalmic solution ofloxacin 0.3 % eye drops    . PARoxetine (PAXIL) 20 MG tablet Take 40 mg by mouth daily.     . prednisoLONE acetate (PRED FORTE) 1 % ophthalmic suspension prednisolone acetate 1 % eye drops,suspension    . traZODone (DESYREL) 100 MG tablet Take 100 mg by mouth at bedtime as needed for sleep.    . TRIAMTERENE-HCTZ PO Take by mouth.     No current facility-administered medications on file prior to visit.    Allergies  Allergen Reactions  . Penicillins  Anaphylaxis  . Decongestant [Pseudoephedrine Hcl Er] Other (See Comments)    Elevated blood pressure   . Talwin [Pentazocine] Other (See Comments)    Elevated blood pressure.    No results found for this or any previous visit (from the past 2160 hour(s)).  Objective: General: Patient is awake, alert, and oriented x 3 and in no acute distress.  Integument: Skin is warm, dry and supple bilateral. Nails are tender, long, thickened and dystrophic with subungual debris, consistent with onychomycosis, 1-5 bilateral. No signs of infection. No open lesions or preulcerative lesions present bilateral. Remaining integument unremarkable.  Vasculature:  Dorsalis Pedis pulse 1/4 bilateral. Posterior Tibial pulse  1/4 bilateral. Capillary fill time <3 sec 1-5 bilateral. Positive hair growth to the level of the digits.Temperature gradient within normal limits. No varicosities present bilateral. No edema present bilateral.   Neurology: The patient has intact sensation measured with a 5.07/10g Semmes Weinstein Monofilament at all pedal sites bilateral. Vibratory sensation diminished bilateral with tuning fork. No Babinski sign present bilateral.   Musculoskeletal: Asymptomatic pes planus pedal deformities noted bilateral. Muscular strength 5/5 in all lower extremity muscular groups bilateral without pain on range of motion. No tenderness with calf compression bilateral.   Assessment and Plan: Problem List Items Addressed This Visit    None    Visit Diagnoses    Dermatophytosis of nail    -  Primary   Diabetic polyneuropathy associated with type 2 diabetes mellitus (HCC)       Relevant Medications   methocarbamol (ROBAXIN) 500 MG tablet   Toe pain, bilateral       Dry skin         -Examined patient -Xray negative right foot -Recommend topical pain cream right foot -Discussed and educated patient on diabetic foot care, especially with regards to the vascular, neurological and musculoskeletal  systems.  -Stressed the importance of good glycemic control and the detriment of not controlling glucose levels in relation to the foot. -Mechanically debrided all nails 1-5 bilateral using sterile nail nipper and filed with dremel without incident  -Rx urea cream to use daily for dry skin -Patient desires new diabetic shoes and insoles; previously I have given patient a prescription for [facility] and [facility] however patient states that they cannot honor my prescription and the the order must come from a different department and the [place] however he states that I still need to request this order I will have my office staff to call to follow-up to see what the patient is talking about and to further see what additional things we need to do to assist with the patient getting shoes -Patient to return in 3-6 months for at risk foot care -Patient advised to call the office if any problems or questions arise in the meantime.  Asencion Islam, DPM

## 2018-05-09 ENCOUNTER — Telehealth: Payer: Self-pay | Admitting: Podiatry

## 2018-05-09 ENCOUNTER — Other Ambulatory Visit: Payer: Self-pay

## 2018-05-09 DIAGNOSIS — M2141 Flat foot [pes planus] (acquired), right foot: Secondary | ICD-10-CM

## 2018-05-09 DIAGNOSIS — E1142 Type 2 diabetes mellitus with diabetic polyneuropathy: Secondary | ICD-10-CM

## 2018-05-09 DIAGNOSIS — M2142 Flat foot [pes planus] (acquired), left foot: Principal | ICD-10-CM

## 2018-05-10 ENCOUNTER — Other Ambulatory Visit: Payer: Self-pay

## 2018-05-10 DIAGNOSIS — E1142 Type 2 diabetes mellitus with diabetic polyneuropathy: Secondary | ICD-10-CM

## 2018-05-24 DIAGNOSIS — H524 Presbyopia: Secondary | ICD-10-CM | POA: Diagnosis not present

## 2018-05-24 DIAGNOSIS — H52223 Regular astigmatism, bilateral: Secondary | ICD-10-CM | POA: Diagnosis not present

## 2018-08-09 ENCOUNTER — Ambulatory Visit: Payer: Non-veteran care | Admitting: Sports Medicine

## 2018-08-09 ENCOUNTER — Ambulatory Visit: Payer: Non-veteran care | Admitting: Podiatry

## 2018-09-05 DIAGNOSIS — E538 Deficiency of other specified B group vitamins: Secondary | ICD-10-CM | POA: Diagnosis not present

## 2018-09-05 DIAGNOSIS — J069 Acute upper respiratory infection, unspecified: Secondary | ICD-10-CM | POA: Diagnosis not present

## 2018-09-20 DIAGNOSIS — R05 Cough: Secondary | ICD-10-CM | POA: Diagnosis not present

## 2018-09-20 DIAGNOSIS — J069 Acute upper respiratory infection, unspecified: Secondary | ICD-10-CM | POA: Diagnosis not present

## 2018-09-20 MED FILL — BROMPHENIR-PSEUDOEPHED-DM S: 30-2-10 | 14 days supply | Qty: 200 | Fill #0

## 2018-09-30 DIAGNOSIS — B353 Tinea pedis: Secondary | ICD-10-CM | POA: Diagnosis not present

## 2018-09-30 MED FILL — CALCIPOTRIENE 0.005% SOLN: 0.005 | 15 days supply | Qty: 60 | Fill #0

## 2018-10-05 ENCOUNTER — Ambulatory Visit (INDEPENDENT_AMBULATORY_CARE_PROVIDER_SITE_OTHER): Payer: Medicare HMO | Admitting: Podiatry

## 2018-10-05 DIAGNOSIS — E1142 Type 2 diabetes mellitus with diabetic polyneuropathy: Secondary | ICD-10-CM

## 2018-10-05 DIAGNOSIS — M79674 Pain in right toe(s): Secondary | ICD-10-CM

## 2018-10-05 DIAGNOSIS — B353 Tinea pedis: Secondary | ICD-10-CM | POA: Diagnosis not present

## 2018-10-05 DIAGNOSIS — B351 Tinea unguium: Secondary | ICD-10-CM

## 2018-10-05 DIAGNOSIS — M79675 Pain in left toe(s): Secondary | ICD-10-CM

## 2018-10-05 NOTE — Patient Instructions (Signed)

## 2018-10-12 ENCOUNTER — Encounter: Payer: Self-pay | Admitting: Podiatry

## 2018-10-12 MED ORDER — CALCIPOTRIENE 0.005 % EX SOLN: CUTANEOUS | 11 refills | Status: DC

## 2018-10-12 NOTE — Progress Notes (Signed)
Subjective: Ethan Pierce presents for preventative diabetic foot care on today with painful, thick toenails 1-5 b/l that he cannot cut and which interfere with daily activities.  Pain is aggravated when wearing enclosed shoe gear and responds to periodic professional debridement.  He relates the cream prescribed for him on his last visit did not work on his lesions. He was given a prescription by his PCP for Calcipotriene 0.005% solution and his skin lesions are responding to it. He would like prescription sent to [facility] because he has to pay $200 for a 60 ml bottle of solution.  System, Provider Not In    Allergies  Allergen Reactions  . Penicillins Anaphylaxis  . Decongestant [Pseudoephedrine Hcl Er] Other (See Comments)    Elevated blood pressure   . Talwin [Pentazocine] Other (See Comments)    Elevated blood pressure.    Objective:  Vascular Examination: Capillary refill time <3 seconds x 10 digits  Dorsalis pedis 1/4 b/l.   Posterior tibial pulses 1/4 b/l.  Digital hair present x 10 digits.  Skin temperature gradient WNL b/l.  Dermatological Examination: Skin with normal turgor, texture and tone b/l.  Toenails 1-5 b/l discolored, thick, dystrophic with subungual debris and pain with palpation to nailbeds due to thickness of nails.  Raised keratotic plaques noted on posteromedial aspect of both feet.  Musculoskeletal: Muscle strength 5/5 to all LE muscle groups  Pes planus b/l   No pain, crepitus or joint limitation noted with ROM.   Neurological: Sensation intact with 10 gram monofilament.  Vibratory sensation intact.  Assessment: Painful onychomycosis toenails 1-5 b/l  NIDDM with neuropathy Dermatophytosis b/l feet (tinea pedis b/l)                                                                                                    Plan: 1. Toenails 1-5 b/l were debrided in length and girth without iatrogenic bleeding. 2. Dermatophytosis of  feet is his service connected diagnosis for the [employer]. Rx will be sent to [facility] for Calcipotriene 0.005% [place]. Patient to apply to feet tid. Refills x 11. 3. Patient to continue soft, supportive shoe gear. 4. Patient to report any pedal injuries to medical professional immediately. 5. Follow up 3 months.  6. Patient/POA to call should there be a concern in the interim.

## 2018-10-14 ENCOUNTER — Telehealth: Payer: Self-pay | Admitting: *Deleted

## 2018-10-14 NOTE — Telephone Encounter (Signed)
Faxed hand written rx to [facility].

## 2018-10-31 DIAGNOSIS — F339 Major depressive disorder, recurrent, unspecified: Secondary | ICD-10-CM | POA: Diagnosis not present

## 2018-10-31 DIAGNOSIS — R5382 Chronic fatigue, unspecified: Secondary | ICD-10-CM | POA: Diagnosis not present

## 2018-10-31 DIAGNOSIS — H8109 Meniere's disease, unspecified ear: Secondary | ICD-10-CM | POA: Diagnosis not present

## 2018-10-31 DIAGNOSIS — F4310 Post-traumatic stress disorder, unspecified: Secondary | ICD-10-CM | POA: Diagnosis not present

## 2018-10-31 DIAGNOSIS — F419 Anxiety disorder, unspecified: Secondary | ICD-10-CM | POA: Diagnosis not present

## 2018-10-31 MED FILL — VENLAFAXINE HCL ER 37.5 MG: 37.5 | 30 days supply | Qty: 30 | Fill #0

## 2018-12-01 DIAGNOSIS — R5382 Chronic fatigue, unspecified: Secondary | ICD-10-CM | POA: Diagnosis not present

## 2018-12-01 DIAGNOSIS — Z7189 Other specified counseling: Secondary | ICD-10-CM | POA: Diagnosis not present

## 2018-12-01 MED FILL — VENLAFAXINE HCL ER 37.5 MG: 37.5 | 30 days supply | Qty: 30 | Fill #1

## 2019-01-03 ENCOUNTER — Encounter: Payer: Self-pay | Admitting: Sports Medicine

## 2019-01-03 ENCOUNTER — Ambulatory Visit: Payer: Non-veteran care | Admitting: Podiatry

## 2019-01-03 ENCOUNTER — Other Ambulatory Visit: Payer: Self-pay

## 2019-01-03 ENCOUNTER — Ambulatory Visit (INDEPENDENT_AMBULATORY_CARE_PROVIDER_SITE_OTHER): Payer: Medicare HMO | Admitting: Sports Medicine

## 2019-01-03 DIAGNOSIS — M79675 Pain in left toe(s): Secondary | ICD-10-CM | POA: Diagnosis not present

## 2019-01-03 DIAGNOSIS — B353 Tinea pedis: Secondary | ICD-10-CM | POA: Diagnosis not present

## 2019-01-03 DIAGNOSIS — E1142 Type 2 diabetes mellitus with diabetic polyneuropathy: Secondary | ICD-10-CM

## 2019-01-03 DIAGNOSIS — M79674 Pain in right toe(s): Secondary | ICD-10-CM | POA: Diagnosis not present

## 2019-01-03 DIAGNOSIS — B351 Tinea unguium: Secondary | ICD-10-CM

## 2019-01-03 NOTE — Progress Notes (Signed)
Subjective: Ethan Pierce is a 73 y.o. male patient with history of diabetes who returns to office today complaining of long, painful nails while ambulating in shoes; unable to trim. Patient states that the glucose reading this morning was not recorded. Patient denies any new changes in medication or new problems. Patient desires new diabetic shoes and reports that he will be using his Medicare because he is having issues with getting things done through the [place]. No other symptoms at this time.   Patient Active Problem List   Diagnosis Date Noted  . Lumbar radiculopathy 03/03/2018  . HTN (hypertension) 08/08/2012  . GERD (gastroesophageal reflux disease) 08/08/2012   Current Outpatient Medications on File Prior to Visit  Medication Sig Dispense Refill  . atorvastatin (LIPITOR) 80 MG tablet Take 80 mg by mouth daily.    Marland Kitchen BISACODYL PO Take by mouth.    . brompheniramine-pseudoephedrine-DM 30-2-10 MG/5ML syrup     . Calcipotriene 0.005 % solution     . Calcipotriene 0.005 % solution Apply to feet tid 60 mL 11  . chlorhexidine (PERIDEX) 0.12 % solution     . citalopram (CELEXA) 20 MG tablet Take 20 mg by mouth daily as needed (for anxiety).    . Cyanocobalamin (VITAMIN B 12 PO) Take by mouth.    . fluticasone (FLONASE) 50 MCG/ACT nasal spray Place into both nostrils daily.    Marland Kitchen glimepiride (AMARYL) 4 MG tablet TAKE 1 TABLET BY MOUTH WITH BREAKFAST OR THE FIRST MAIN MEAL OF THE DAY ONCE A DAY (FOR DIABETES)  6  . meloxicam (MOBIC) 15 MG tablet meloxicam 15 mg tablet    . methocarbamol (ROBAXIN) 500 MG tablet methocarbamol 500 mg tablet    . ofloxacin (OCUFLOX) 0.3 % ophthalmic solution ofloxacin 0.3 % eye drops    . PARoxetine (PAXIL) 20 MG tablet Take 40 mg by mouth daily.     . prednisoLONE acetate (PRED FORTE) 1 % ophthalmic suspension prednisolone acetate 1 % eye drops,suspension    . traZODone (DESYREL) 100 MG tablet Take 100 mg by mouth at bedtime as needed for sleep.    .  TRIAMTERENE-HCTZ PO Take by mouth.    . urea (CARMOL) 20 % cream Apply topically at bedtime. For dry skin 85 g 5  . venlafaxine XR (EFFEXOR-XR) 37.5 MG 24 hr capsule      No current facility-administered medications on file prior to visit.    Allergies  Allergen Reactions  . Penicillins Anaphylaxis  . Decongestant [Pseudoephedrine Hcl Er] Other (See Comments)    Elevated blood pressure   . Talwin [Pentazocine] Other (See Comments)    Elevated blood pressure.    No results found for this or any previous visit (from the past 2160 hour(s)).  Objective: General: Patient is awake, alert, and oriented x 3 and in no acute distress.  Integument: Skin is warm, dry and supple bilateral. Nails are tender, long, thickened and dystrophic with subungual debris, consistent with onychomycosis, 1-5 bilateral. Dry skin bilateral. No signs of infection. No open lesions or preulcerative lesions present bilateral. Remaining integument unremarkable.  Vasculature:  Dorsalis Pedis pulse 1/4 bilateral. Posterior Tibial pulse  1/4 bilateral. Capillary fill time <3 sec 1-5 bilateral. Positive hair growth to the level of the digits.Temperature gradient within normal limits. No varicosities present bilateral. No edema present bilateral.   Neurology: The patient has intact sensation measured with a 5.07/10g Semmes Weinstein Monofilament at all pedal sites bilateral. Vibratory sensation diminished bilateral with tuning fork. No Babinski sign  present bilateral.   Musculoskeletal: Asymptomatic mild hammertoe and pes planus pedal deformities noted bilateral. Muscular strength 5/5 in all lower extremity muscular groups bilateral without pain on range of motion. No tenderness with calf compression bilateral.   Assessment and Plan: Problem List Items Addressed This Visit    None    Visit Diagnoses    Pain due to onychomycosis of toenails of both feet    -  Primary   Tinea pedis of both feet       Diabetic peripheral  neuropathy associated with type 2 diabetes mellitus (HCC)       Relevant Medications   venlafaxine XR (EFFEXOR-XR) 37.5 MG 24 hr capsule     -Examined patient -Discussed and educated patient on diabetic foot care, especially with regards to the vascular, neurological and musculoskeletal systems.  -Mechanically debrided all nails 1-5 bilateral using sterile nail nipper and filed with dremel without incident  -Continue with daily skin creams  -Patient desires new diabetic shoes and insoles; office to arrange shoe fitting and will send [employer] paperwork to Dr. Thomasena Edisollins -Patient to return in 3 months for at risk foot care -Patient advised to call the office if any problems or questions arise in the meantime.  Asencion Islamitorya Shedrick Sarli, DPM

## 2019-01-12 DIAGNOSIS — H524 Presbyopia: Secondary | ICD-10-CM | POA: Diagnosis not present

## 2019-01-12 DIAGNOSIS — H52223 Regular astigmatism, bilateral: Secondary | ICD-10-CM | POA: Diagnosis not present

## 2019-01-23 ENCOUNTER — Other Ambulatory Visit: Payer: Self-pay

## 2019-01-23 ENCOUNTER — Ambulatory Visit: Payer: Medicare HMO | Admitting: Orthotics

## 2019-01-23 DIAGNOSIS — E1142 Type 2 diabetes mellitus with diabetic polyneuropathy: Secondary | ICD-10-CM

## 2019-01-23 NOTE — Progress Notes (Signed)

## 2019-02-13 ENCOUNTER — Telehealth: Payer: Self-pay | Admitting: *Deleted

## 2019-02-13 NOTE — Telephone Encounter (Signed)
[facility] states they have received the paperwork for diabetic shoes and it will be evaluated at pt's next appt with Dr. Maudie Mercury.

## 2019-02-21 DIAGNOSIS — Z0000 Encounter for general adult medical examination without abnormal findings: Secondary | ICD-10-CM | POA: Diagnosis not present

## 2019-02-21 DIAGNOSIS — E785 Hyperlipidemia, unspecified: Secondary | ICD-10-CM | POA: Diagnosis not present

## 2019-02-21 DIAGNOSIS — I10 Essential (primary) hypertension: Secondary | ICD-10-CM | POA: Diagnosis not present

## 2019-02-21 DIAGNOSIS — R739 Hyperglycemia, unspecified: Secondary | ICD-10-CM | POA: Diagnosis not present

## 2019-02-21 DIAGNOSIS — E119 Type 2 diabetes mellitus without complications: Secondary | ICD-10-CM | POA: Diagnosis not present

## 2019-02-21 NOTE — Telephone Encounter (Signed)
Called to leave msg to notify dr. Iran Planas' signed off on DBS.

## 2019-02-27 DIAGNOSIS — D649 Anemia, unspecified: Secondary | ICD-10-CM | POA: Diagnosis not present

## 2019-02-27 DIAGNOSIS — E538 Deficiency of other specified B group vitamins: Secondary | ICD-10-CM | POA: Diagnosis not present

## 2019-02-27 DIAGNOSIS — E1140 Type 2 diabetes mellitus with diabetic neuropathy, unspecified: Secondary | ICD-10-CM | POA: Diagnosis not present

## 2019-02-27 DIAGNOSIS — N182 Chronic kidney disease, stage 2 (mild): Secondary | ICD-10-CM | POA: Diagnosis not present

## 2019-02-27 DIAGNOSIS — I10 Essential (primary) hypertension: Secondary | ICD-10-CM | POA: Diagnosis not present

## 2019-02-27 DIAGNOSIS — H8109 Meniere's disease, unspecified ear: Secondary | ICD-10-CM | POA: Diagnosis not present

## 2019-02-27 DIAGNOSIS — F4310 Post-traumatic stress disorder, unspecified: Secondary | ICD-10-CM | POA: Diagnosis not present

## 2019-02-27 DIAGNOSIS — R2681 Unsteadiness on feet: Secondary | ICD-10-CM | POA: Diagnosis not present

## 2019-02-27 DIAGNOSIS — E785 Hyperlipidemia, unspecified: Secondary | ICD-10-CM | POA: Diagnosis not present

## 2019-02-27 MED FILL — PRAVASTATIN NA 10 MG TAB: 10 | 30 days supply | Qty: 30 | Fill #0

## 2019-02-27 MED FILL — JANUVIA 50 MG TABLET: 50 | 30 days supply | Qty: 30 | Fill #0

## 2019-03-24 ENCOUNTER — Encounter: Payer: Self-pay | Admitting: Podiatry

## 2019-03-24 ENCOUNTER — Other Ambulatory Visit: Payer: Self-pay

## 2019-03-24 ENCOUNTER — Ambulatory Visit (INDEPENDENT_AMBULATORY_CARE_PROVIDER_SITE_OTHER): Payer: Medicare HMO | Admitting: Podiatry

## 2019-03-24 VITALS — Temp 98.2°F

## 2019-03-24 DIAGNOSIS — M79675 Pain in left toe(s): Secondary | ICD-10-CM | POA: Diagnosis not present

## 2019-03-24 DIAGNOSIS — E1142 Type 2 diabetes mellitus with diabetic polyneuropathy: Secondary | ICD-10-CM

## 2019-03-24 DIAGNOSIS — L84 Corns and callosities: Secondary | ICD-10-CM

## 2019-03-24 DIAGNOSIS — M79674 Pain in right toe(s): Secondary | ICD-10-CM | POA: Diagnosis not present

## 2019-03-24 DIAGNOSIS — B351 Tinea unguium: Secondary | ICD-10-CM

## 2019-03-24 NOTE — Patient Instructions (Signed)
Diabetes Mellitus and Foot Care Foot care is an important part of your health, especially when you have diabetes. Diabetes may cause you to have problems because of poor blood flow (circulation) to your feet and legs, which can cause your skin to:  Become thinner and drier.  Break more easily.  Heal more slowly.  Peel and crack. You may also have nerve damage (neuropathy) in your legs and feet, causing decreased feeling in them. This means that you may not notice minor injuries to your feet that could lead to more serious problems. Noticing and addressing any potential problems early is the best way to prevent future foot problems. How to care for your feet Foot hygiene  Wash your feet daily with warm water and mild soap. Do not use hot water. Then, pat your feet and the areas between your toes until they are completely dry. Do not soak your feet as this can dry your skin.  Trim your toenails straight across. Do not dig under them or around the cuticle. File the edges of your nails with an emery board or nail file.  Apply a moisturizing lotion or petroleum jelly to the skin on your feet and to dry, brittle toenails. Use lotion that does not contain alcohol and is unscented. Do not apply lotion between your toes. Shoes and socks  Wear clean socks or stockings every day. Make sure they are not too tight. Do not wear knee-high stockings since they may decrease blood flow to your legs.  Wear shoes that fit properly and have enough cushioning. Always look in your shoes before you put them on to be sure there are no objects inside.  To break in new shoes, wear them for just a few hours a day. This prevents injuries on your feet. Wounds, scrapes, corns, and calluses  Check your feet daily for blisters, cuts, bruises, sores, and redness. If you cannot see the bottom of your feet, use a mirror or ask someone for help.  Do not cut corns or calluses or try to remove them with medicine.  If you  find a minor scrape, cut, or break in the skin on your feet, keep it and the skin around it clean and dry. You may clean these areas with mild soap and water. Do not clean the area with peroxide, alcohol, or iodine.  If you have a wound, scrape, corn, or callus on your foot, look at it several times a day to make sure it is healing and not infected. Check for: ? Redness, swelling, or pain. ? Fluid or blood. ? Warmth. ? Pus or a bad smell. General instructions  Do not cross your legs. This may decrease blood flow to your feet.  Do not use heating pads or hot water bottles on your feet. They may burn your skin. If you have lost feeling in your feet or legs, you may not know this is happening until it is too late.  Protect your feet from hot and cold by wearing shoes, such as at the beach or on hot pavement.  Schedule a complete foot exam at least once a year (annually) or more often if you have foot problems. If you have foot problems, report any cuts, sores, or bruises to your health care provider immediately. Contact a health care provider if:  You have a medical condition that increases your risk of infection and you have any cuts, sores, or bruises on your feet.  You have an injury that is not   healing.  You have redness on your legs or feet.  You feel burning or tingling in your legs or feet.  You have pain or cramps in your legs and feet.  Your legs or feet are numb.  Your feet always feel cold.  You have pain around a toenail. Get help right away if:  You have a wound, scrape, corn, or callus on your foot and: ? You have pain, swelling, or redness that gets worse. ? You have fluid or blood coming from the wound, scrape, corn, or callus. ? Your wound, scrape, corn, or callus feels warm to the touch. ? You have pus or a bad smell coming from the wound, scrape, corn, or callus. ? You have a fever. ? You have a red line going up your leg. Summary  Check your feet every day  for cuts, sores, red spots, swelling, and blisters.  Moisturize feet and legs daily.  Wear shoes that fit properly and have enough cushioning.  If you have foot problems, report any cuts, sores, or bruises to your health care provider immediately.  Schedule a complete foot exam at least once a year (annually) or more often if you have foot problems. This information is not intended to replace advice given to you by your health care provider. Make sure you discuss any questions you have with your health care provider. Document Released: 07/17/2000 Document Revised: 09/01/2017 Document Reviewed: 08/21/2016 Elsevier Patient Education  2020 Elsevier Inc.   Onychomycosis/Fungal Toenails  WHAT IS IT? An infection that lies within the keratin of your nail plate that is caused by a fungus.  WHY ME? Fungal infections affect all ages, sexes, races, and creeds.  There may be many factors that predispose you to a fungal infection such as age, coexisting medical conditions such as diabetes, or an autoimmune disease; stress, medications, fatigue, genetics, etc.  Bottom line: fungus thrives in a warm, moist environment and your shoes offer such a location.  IS IT CONTAGIOUS? Theoretically, yes.  You do not want to share shoes, nail clippers or files with someone who has fungal toenails.  Walking around barefoot in the same room or sleeping in the same bed is unlikely to transfer the organism.  It is important to realize, however, that fungus can spread easily from one nail to the next on the same foot.  HOW DO WE TREAT THIS?  There are several ways to treat this condition.  Treatment may depend on many factors such as age, medications, pregnancy, liver and kidney conditions, etc.  It is best to ask your doctor which options are available to you.  1. No treatment.   Unlike many other medical concerns, you can live with this condition.  However for many people this can be a painful condition and may lead to  ingrown toenails or a bacterial infection.  It is recommended that you keep the nails cut short to help reduce the amount of fungal nail. 2. Topical treatment.  These range from herbal remedies to prescription strength nail lacquers.  About 40-50% effective, topicals require twice daily application for approximately 9 to 12 months or until an entirely new nail has grown out.  The most effective topicals are medical grade medications available through physicians offices. 3. Oral antifungal medications.  With an 80-90% cure rate, the most common oral medication requires 3 to 4 months of therapy and stays in your system for a year as the new nail grows out.  Oral antifungal medications do require   blood work to make sure it is a safe drug for you.  A liver function panel will be performed prior to starting the medication and after the first month of treatment.  It is important to have the blood work performed to avoid any harmful side effects.  In general, this medication safe but blood work is required. 4. Laser Therapy.  This treatment is performed by applying a specialized laser to the affected nail plate.  This therapy is noninvasive, fast, and non-painful.  It is not covered by insurance and is therefore, out of pocket.  The results have been very good with a 80-95% cure rate.  The [facility] is the only practice in the area to offer this therapy. 5. Permanent Nail Avulsion.  Removing the entire nail so that a new nail will not grow back. 

## 2019-04-02 NOTE — Progress Notes (Signed)
Subjective: Ethan Pierce presents with diabetes, diabetic neuropathy and cc of painful, discolored, thick toenails and callus left foot which interfere with activities of daily living. Pain is aggravated when wearing enclosed shoe gear. Pain is relieved with periodic professional debridement.  Ethan Gravel, MD is his PCP.    Current Outpatient Medications:  .  atorvastatin (LIPITOR) 80 MG tablet, Take 80 mg by mouth daily., Disp: , Rfl:  .  BISACODYL PO, Take by mouth., Disp: , Rfl:  .  brompheniramine-pseudoephedrine-DM 30-2-10 MG/5ML syrup, , Disp: , Rfl:  .  Calcipotriene 0.005 % solution, , Disp: , Rfl:  .  Calcipotriene 0.005 % solution, Apply to feet tid, Disp: 60 mL, Rfl: 11 .  chlorhexidine (PERIDEX) 0.12 % solution, , Disp: , Rfl:  .  citalopram (CELEXA) 20 MG tablet, Take 20 mg by mouth daily as needed (for anxiety)., Disp: , Rfl:  .  Cyanocobalamin (VITAMIN B 12 PO), Take by mouth., Disp: , Rfl:  .  fluticasone (FLONASE) 50 MCG/ACT nasal spray, Place into both nostrils daily., Disp: , Rfl:  .  glimepiride (AMARYL) 4 MG tablet, TAKE 1 TABLET BY MOUTH WITH BREAKFAST OR THE FIRST MAIN MEAL OF THE DAY ONCE A DAY (FOR DIABETES), Disp: , Rfl: 6 .  meloxicam (MOBIC) 15 MG tablet, meloxicam 15 mg tablet, Disp: , Rfl:  .  methocarbamol (ROBAXIN) 500 MG tablet, methocarbamol 500 mg tablet, Disp: , Rfl:  .  ofloxacin (OCUFLOX) 0.3 % ophthalmic solution, ofloxacin 0.3 % eye drops, Disp: , Rfl:  .  PARoxetine (PAXIL) 20 MG tablet, Take 40 mg by mouth daily. , Disp: , Rfl:  .  prednisoLONE acetate (PRED FORTE) 1 % ophthalmic suspension, prednisolone acetate 1 % eye drops,suspension, Disp: , Rfl:  .  traZODone (DESYREL) 100 MG tablet, Take 100 mg by mouth at bedtime as needed for sleep., Disp: , Rfl:  .  TRIAMTERENE-HCTZ PO, Take by mouth., Disp: , Rfl:  .  urea (CARMOL) 20 % cream, Apply topically at bedtime. For dry skin, Disp: 85 g, Rfl: 5 .  venlafaxine XR (EFFEXOR-XR) 37.5 MG 24 hr  capsule, , Disp: , Rfl:   Allergies  Allergen Reactions  . Penicillins Anaphylaxis  . Decongestant [Pseudoephedrine Hcl Er] Other (See Comments)    Elevated blood pressure   . Talwin [Pentazocine] Other (See Comments)    Elevated blood pressure.    Objective: Vitals:   03/24/19 0906  Temp: 98.2 F (36.8 C)    Vascular Examination: Capillary refill time <3 seconds x 10 digits.  Dorsalis pedis pulses 1/4 b/l.  Posterior tibial pulses 1/4 b/l.  Digital hair present x 10 digits.  Skin temperature gradient WNL b/l.  Dermatological Examination: Skin with normal turgor, texture and tone b/l.  Toenails 1-5 b/l discolored, thick, dystrophic with subungual debris and pain with palpation to nailbeds due to thickness of nails.  Hyperkeratotic lesion(s) submet head 5 left foot. No erythema, no edema, no drainage, no flocculence noted.   Musculoskeletal: Muscle strength 5/5 to all LE muscle groups.  Hammertoes 5th b/l, 4th right foot.  Pes planus b/l.  No pain, crepitus or joint limitation with passive/active ROM.  Neurological: Sensation intact 5/5 b/l with 10 gram monofilament.  Vibratory sensation diminished  Assessment: 1. Painful onychomycosis toenails 1-5 b/l 2. Callus submet head 5 left foot 3. NIDDM with Diabetic neuropathy  Plan: 1. Continue diabetic foot care principles. Literature dispensed on today. 2. Toenails 1-5 b/l were debrided in length and girth without iatrogenic bleeding.  3. Callus pared submetatarsal head(s) 5 left foot utilizing sterile scalpel blade without incident.  4. Patient to continue soft, supportive shoe gear 5. Patient to report any pedal injuries to medical professional  6. Follow up 3 months.  7. Patient/POA to call should there be a concern in the interim.

## 2019-04-11 ENCOUNTER — Ambulatory Visit: Payer: Medicare HMO | Admitting: Podiatry

## 2019-04-17 ENCOUNTER — Other Ambulatory Visit: Payer: Self-pay

## 2019-04-17 ENCOUNTER — Ambulatory Visit (INDEPENDENT_AMBULATORY_CARE_PROVIDER_SITE_OTHER): Payer: Medicare HMO | Admitting: Orthotics

## 2019-04-17 DIAGNOSIS — M2142 Flat foot [pes planus] (acquired), left foot: Secondary | ICD-10-CM | POA: Diagnosis not present

## 2019-04-17 DIAGNOSIS — M2141 Flat foot [pes planus] (acquired), right foot: Secondary | ICD-10-CM

## 2019-04-17 DIAGNOSIS — M79675 Pain in left toe(s): Secondary | ICD-10-CM

## 2019-04-17 DIAGNOSIS — E1142 Type 2 diabetes mellitus with diabetic polyneuropathy: Secondary | ICD-10-CM

## 2019-04-17 DIAGNOSIS — L84 Corns and callosities: Secondary | ICD-10-CM

## 2019-04-17 DIAGNOSIS — B351 Tinea unguium: Secondary | ICD-10-CM

## 2019-04-17 NOTE — Progress Notes (Signed)

## 2019-05-04 DIAGNOSIS — Z8616 Personal history of COVID-19: Secondary | ICD-10-CM

## 2019-05-04 DIAGNOSIS — Z8619 Personal history of other infectious and parasitic diseases: Secondary | ICD-10-CM

## 2019-05-04 HISTORY — DX: Personal history of COVID-19: Z86.16

## 2019-05-04 HISTORY — PX: WISDOM TOOTH EXTRACTION: SHX21

## 2019-05-04 HISTORY — DX: Personal history of other infectious and parasitic diseases: Z86.19

## 2019-05-16 MED FILL — CLINDAMYCIN HCL 300 MG CAPS: 300 | 5 days supply | Qty: 20 | Fill #0

## 2019-05-16 MED FILL — CHLORHEXIDINE 0.12% RINSE: 0.12 | 16 days supply | Qty: 473 | Fill #0

## 2019-05-16 MED FILL — traMADol HCL 50 MG TABS: 50 | 2 days supply | Qty: 12 | Fill #0

## 2019-05-24 DIAGNOSIS — R5382 Chronic fatigue, unspecified: Secondary | ICD-10-CM | POA: Diagnosis not present

## 2019-05-24 DIAGNOSIS — I10 Essential (primary) hypertension: Secondary | ICD-10-CM | POA: Diagnosis not present

## 2019-05-24 DIAGNOSIS — D649 Anemia, unspecified: Secondary | ICD-10-CM | POA: Diagnosis not present

## 2019-05-24 DIAGNOSIS — E1140 Type 2 diabetes mellitus with diabetic neuropathy, unspecified: Secondary | ICD-10-CM | POA: Diagnosis not present

## 2019-05-24 DIAGNOSIS — H8103 Meniere's disease, bilateral: Secondary | ICD-10-CM | POA: Diagnosis not present

## 2019-05-24 DIAGNOSIS — E119 Type 2 diabetes mellitus without complications: Secondary | ICD-10-CM | POA: Diagnosis not present

## 2019-05-24 DIAGNOSIS — H8109 Meniere's disease, unspecified ear: Secondary | ICD-10-CM | POA: Diagnosis not present

## 2019-05-24 DIAGNOSIS — M5432 Sciatica, left side: Secondary | ICD-10-CM | POA: Diagnosis not present

## 2019-05-24 MED FILL — MECLIZINE 25 MG TABLET: 25 | 17 days supply | Qty: 50 | Fill #0

## 2019-05-25 DIAGNOSIS — F4310 Post-traumatic stress disorder, unspecified: Secondary | ICD-10-CM | POA: Diagnosis not present

## 2019-05-25 DIAGNOSIS — F339 Major depressive disorder, recurrent, unspecified: Secondary | ICD-10-CM | POA: Diagnosis not present

## 2019-05-25 DIAGNOSIS — E785 Hyperlipidemia, unspecified: Secondary | ICD-10-CM | POA: Diagnosis not present

## 2019-05-25 DIAGNOSIS — E1140 Type 2 diabetes mellitus with diabetic neuropathy, unspecified: Secondary | ICD-10-CM | POA: Diagnosis not present

## 2019-05-25 DIAGNOSIS — N182 Chronic kidney disease, stage 2 (mild): Secondary | ICD-10-CM | POA: Diagnosis not present

## 2019-05-25 DIAGNOSIS — R2681 Unsteadiness on feet: Secondary | ICD-10-CM | POA: Diagnosis not present

## 2019-05-25 DIAGNOSIS — E119 Type 2 diabetes mellitus without complications: Secondary | ICD-10-CM | POA: Diagnosis not present

## 2019-05-25 DIAGNOSIS — H8109 Meniere's disease, unspecified ear: Secondary | ICD-10-CM | POA: Diagnosis not present

## 2019-05-25 DIAGNOSIS — I10 Essential (primary) hypertension: Secondary | ICD-10-CM | POA: Diagnosis not present

## 2019-05-25 MED FILL — FARXIGA 5 MG TABLET: 5 | 30 days supply | Qty: 30 | Fill #0

## 2019-05-29 ENCOUNTER — Other Ambulatory Visit: Payer: Self-pay

## 2019-05-29 ENCOUNTER — Emergency Department (HOSPITAL_COMMUNITY): Payer: No Typology Code available for payment source

## 2019-05-29 ENCOUNTER — Encounter (HOSPITAL_COMMUNITY): Payer: Self-pay | Admitting: Emergency Medicine

## 2019-05-29 ENCOUNTER — Emergency Department (HOSPITAL_COMMUNITY)
Admission: EM | Admit: 2019-05-29 | Discharge: 2019-05-29 | Disposition: A | Discharge status: Home / Self Care | Payer: No Typology Code available for payment source | Attending: Emergency Medicine | Admitting: Emergency Medicine

## 2019-05-29 DIAGNOSIS — G501 Atypical facial pain: Secondary | ICD-10-CM | POA: Insufficient documentation

## 2019-05-29 DIAGNOSIS — R509 Fever, unspecified: Secondary | ICD-10-CM | POA: Diagnosis not present

## 2019-05-29 DIAGNOSIS — R55 Syncope and collapse: Secondary | ICD-10-CM | POA: Diagnosis not present

## 2019-05-29 DIAGNOSIS — Z0000 Encounter for general adult medical examination without abnormal findings: Secondary | ICD-10-CM | POA: Diagnosis not present

## 2019-05-29 DIAGNOSIS — W19XXXA Unspecified fall, initial encounter: Secondary | ICD-10-CM

## 2019-05-29 DIAGNOSIS — R42 Dizziness and giddiness: Secondary | ICD-10-CM

## 2019-05-29 DIAGNOSIS — M25511 Pain in right shoulder: Secondary | ICD-10-CM | POA: Diagnosis not present

## 2019-05-29 LAB — BASIC METABOLIC PANEL
Anion gap: 12 (ref 5–15)
BUN: 15 mg/dL (ref 8–23)
CO2: 26 mmol/L (ref 22–32)
Calcium: 8.1 mg/dL — ABNORMAL LOW (ref 8.9–10.3)
Chloride: 97 mmol/L — ABNORMAL LOW (ref 98–111)
Creatinine, Ser: 1.45 mg/dL — ABNORMAL HIGH (ref 0.61–1.24)
GFR calc Af Amer: 55 mL/min — ABNORMAL LOW (ref >60)
GFR calc non Af Amer: 47 mL/min — ABNORMAL LOW (ref >60)
Glucose, Bld: 167 mg/dL — ABNORMAL HIGH (ref 70–99)
Potassium: 4.2 mmol/L (ref 3.5–5.1)
Sodium: 135 mmol/L (ref 135–145)

## 2019-05-29 LAB — CBC
HCT: 37.8 % — ABNORMAL LOW (ref 39.0–52.0)
Hemoglobin: 12.4 g/dL — ABNORMAL LOW (ref 13.0–17.0)
MCH: 30.8 pg (ref 26.0–34.0)
MCHC: 32.8 g/dL (ref 30.0–36.0)
MCV: 94 fL (ref 80.0–100.0)
Platelets: 165 10*3/uL (ref 150–400)
RBC: 4.02 MIL/uL — ABNORMAL LOW (ref 4.22–5.81)
RDW: 12.1 % (ref 11.5–15.5)
WBC: 7.3 10*3/uL (ref 4.0–10.5)
nRBC: 0 % (ref 0.0–0.2)

## 2019-05-29 LAB — LACTIC ACID, PLASMA: Lactic Acid, Venous: 1 mmol/L (ref 0.5–1.9)

## 2019-05-29 MED ORDER — SODIUM CHLORIDE 0.9% FLUSH: 3.0000 mL | Freq: Once | INTRAVENOUS | Status: DC

## 2019-05-29 MED ORDER — ACETAMINOPHEN 160 MG/5ML PO SOLN
650.0000 mg | Freq: Once | ORAL | Status: AC
  Administered 2019-05-29: 08:00:00 650 mg via ORAL
  Filled 2019-05-29: qty 20.3

## 2019-05-29 MED ORDER — ACETAMINOPHEN 160 MG/5ML PO ELIX: 500.0000 mg | ORAL_SOLUTION | ORAL | 2 refills | Status: DC | PRN

## 2019-05-29 NOTE — ED Notes (Signed)
Orthostatic VS  Lying: BP: 134/61 (81) O2: 98  Sitting: BP: 135/65 (86) O2: 100  Standing: BP: 139/63 (84) O2: 98

## 2019-05-29 NOTE — ED Provider Notes (Signed)
MSE was initiated and I personally evaluated the patient and placed orders (if any) at  8:14 AM on May 29, 2019.  The patient appears stable so that the remainder of the MSE may be completed by another provider.  Due to extreme boarding, patient's encounter is being initiated in triage with no current bed availability for placement within the department.  Patient reports that he had wisdom tooth extraction 2 weeks ago.  He was having some facial soreness for a while.  He was less active than usual.  He reports he did see his doctor on Wednesday and tested negative for coronavirus.  He also had follow-up with ENT without any significant acute findings.  He denies that he has been having any severe ongoing pain or headaches.  No visual changes.  He states that he got up in the early morning hours to get something in the kitchen to drink.  He used his assist device to get down the stairs without difficulty.  Once in the kitchen, he felt like he was suddenly struck by something that just threw him off balance and "tossed him into the recliner".  He reports he landed striking his right shoulder.  Reports he can move the shoulder but it is sore.  No other injury.  Patient denies he had any loss of consciousness during the event.  He reports once it was resolved he did not have persistent dizziness or vertiginous feeling.  He did describe nursing staff is feeling "wobbly".  For me he is denying any focal weakness numbness tingling or gait instability.(He got up out of the chair and ambulated with a coordinated gait) he has been ambulating at baseline.  He denies that there are any visual, or were visual changes with that episode.  He denies he had a perception of chest pain or palpitation during the episode.  Patient is alert and oriented x3.  His speech is clear.  Cognitive function is normal.  Finger-nose is intact.  Pupils are symmetric and responsive.  Extraocular motions are normal.  Patient's gait is  steady.  No focal motor deficit.  No respiratory distress.  Heart borderline tachycardic regular.  Abdomen soft and nondistended.  No significant facial swelling or tenderness to palpation of the neck or jaws.  No peripheral edema.  Patient is febrile and was not aware of this.  We will get basic work-up for infectious etiology.  He is nontoxic.  Patient does not appear septic. Presenting concern for the patient was this very sudden onset of what sounds like vertigo that was severe and caused him to fall into his recliner.  The patient's perception is that he was "thrown from standing into the recliner".  He described this as being quite rapid in onset and resolving abruptly.  Patient's mental status and neurologic status are normal now.  With the abruptness of onset and resolution with complete loss of balance, I have some concern for cerebellar stroke.  Patient is completely asymptomatic at this time without neurologic deficit.  We will proceed with MRI but no initiation of code stroke.   Charlesetta Shanks, MD 05/29/19 [phone]

## 2019-05-29 NOTE — ED Notes (Signed)
PT transportted to MRI

## 2019-05-29 NOTE — ED Triage Notes (Signed)
States had some teeth removed recently had gotten up and he got dizzy and it threw him into recliner now his rt shoulder and face hurts ,  States feels wobbly states he has not walked much since his oral surgery

## 2019-05-29 NOTE — Progress Notes (Signed)
Pt came to MR from the ER waiting room for MR Head.  Pt was severely claustrophobic and at first refused, but we switched him to the larger scanner and pt would only let us obtain diffusion images only.  Pt refused, didn't want meds or anything, just doesn't want to continue.  Tried to call the ER nurse but no answer to phone call.

## 2019-05-29 NOTE — ED Notes (Signed)
Patient verbalizes understanding of discharge instructions. Opportunity for questioning and answers were provided. Armband removed by staff, pt discharged from ED ambulatory to home.  

## 2019-05-30 ENCOUNTER — Other Ambulatory Visit: Payer: Self-pay | Admitting: Registered"

## 2019-05-30 DIAGNOSIS — Z20828 Contact with and (suspected) exposure to other viral communicable diseases: Secondary | ICD-10-CM | POA: Diagnosis not present

## 2019-05-30 DIAGNOSIS — N182 Chronic kidney disease, stage 2 (mild): Secondary | ICD-10-CM | POA: Diagnosis not present

## 2019-05-30 DIAGNOSIS — Z20822 Contact with and (suspected) exposure to covid-19: Secondary | ICD-10-CM

## 2019-05-30 DIAGNOSIS — R319 Hematuria, unspecified: Secondary | ICD-10-CM | POA: Diagnosis not present

## 2019-05-31 ENCOUNTER — Emergency Department (HOSPITAL_BASED_OUTPATIENT_CLINIC_OR_DEPARTMENT_OTHER): Payer: No Typology Code available for payment source

## 2019-05-31 ENCOUNTER — Other Ambulatory Visit: Payer: Self-pay

## 2019-05-31 ENCOUNTER — Encounter (HOSPITAL_BASED_OUTPATIENT_CLINIC_OR_DEPARTMENT_OTHER): Payer: Self-pay | Admitting: *Deleted

## 2019-05-31 ENCOUNTER — Observation Stay (HOSPITAL_BASED_OUTPATIENT_CLINIC_OR_DEPARTMENT_OTHER)
Admission: EM | Admit: 2019-05-31 | Discharge: 2019-05-31 | Discharge status: Against Medical Advice | Payer: No Typology Code available for payment source | Source: Home / Self Care | Attending: Emergency Medicine | Admitting: Emergency Medicine

## 2019-05-31 DIAGNOSIS — R55 Syncope and collapse: Secondary | ICD-10-CM | POA: Diagnosis not present

## 2019-05-31 DIAGNOSIS — J168 Pneumonia due to other specified infectious organisms: Secondary | ICD-10-CM | POA: Diagnosis not present

## 2019-05-31 DIAGNOSIS — A419 Sepsis, unspecified organism: Secondary | ICD-10-CM | POA: Diagnosis not present

## 2019-05-31 DIAGNOSIS — Z7984 Long term (current) use of oral hypoglycemic drugs: Secondary | ICD-10-CM | POA: Insufficient documentation

## 2019-05-31 DIAGNOSIS — Z79899 Other long term (current) drug therapy: Secondary | ICD-10-CM | POA: Insufficient documentation

## 2019-05-31 DIAGNOSIS — Z791 Long term (current) use of non-steroidal anti-inflammatories (NSAID): Secondary | ICD-10-CM | POA: Insufficient documentation

## 2019-05-31 DIAGNOSIS — R918 Other nonspecific abnormal finding of lung field: Secondary | ICD-10-CM | POA: Insufficient documentation

## 2019-05-31 DIAGNOSIS — J1289 Other viral pneumonia: Secondary | ICD-10-CM | POA: Diagnosis not present

## 2019-05-31 DIAGNOSIS — M5416 Radiculopathy, lumbar region: Secondary | ICD-10-CM | POA: Insufficient documentation

## 2019-05-31 DIAGNOSIS — R0981 Nasal congestion: Secondary | ICD-10-CM | POA: Insufficient documentation

## 2019-05-31 DIAGNOSIS — J189 Pneumonia, unspecified organism: Secondary | ICD-10-CM | POA: Diagnosis not present

## 2019-05-31 DIAGNOSIS — R000 Tachycardia, unspecified: Secondary | ICD-10-CM | POA: Insufficient documentation

## 2019-05-31 DIAGNOSIS — K219 Gastro-esophageal reflux disease without esophagitis: Secondary | ICD-10-CM | POA: Insufficient documentation

## 2019-05-31 DIAGNOSIS — Z5329 Procedure and treatment not carried out because of patient's decision for other reasons: Secondary | ICD-10-CM | POA: Insufficient documentation

## 2019-05-31 DIAGNOSIS — S8002XA Contusion of left knee, initial encounter: Secondary | ICD-10-CM | POA: Diagnosis not present

## 2019-05-31 DIAGNOSIS — E119 Type 2 diabetes mellitus without complications: Secondary | ICD-10-CM | POA: Insufficient documentation

## 2019-05-31 DIAGNOSIS — R509 Fever, unspecified: Secondary | ICD-10-CM | POA: Insufficient documentation

## 2019-05-31 DIAGNOSIS — I10 Essential (primary) hypertension: Secondary | ICD-10-CM | POA: Insufficient documentation

## 2019-05-31 DIAGNOSIS — U071 COVID-19: Secondary | ICD-10-CM | POA: Diagnosis not present

## 2019-05-31 DIAGNOSIS — R519 Headache, unspecified: Secondary | ICD-10-CM | POA: Diagnosis not present

## 2019-05-31 LAB — CBC WITH DIFFERENTIAL/PLATELET
Abs Immature Granulocytes: 0.03 10*3/uL (ref 0.00–0.07)
Basophils Absolute: 0 10*3/uL (ref 0.0–0.1)
Basophils Relative: 0 %
Eosinophils Absolute: 0 10*3/uL (ref 0.0–0.5)
Eosinophils Relative: 0 %
HCT: 35.5 % — ABNORMAL LOW (ref 39.0–52.0)
Hemoglobin: 11.6 g/dL — ABNORMAL LOW (ref 13.0–17.0)
Immature Granulocytes: 0 %
Lymphocytes Relative: 11 %
Lymphs Abs: 0.8 10*3/uL (ref 0.7–4.0)
MCH: 30.2 pg (ref 26.0–34.0)
MCHC: 32.7 g/dL (ref 30.0–36.0)
MCV: 92.4 fL (ref 80.0–100.0)
Monocytes Absolute: 0.7 10*3/uL (ref 0.1–1.0)
Monocytes Relative: 9 %
Neutro Abs: 5.8 10*3/uL (ref 1.7–7.7)
Neutrophils Relative %: 80 %
Platelets: 175 10*3/uL (ref 150–400)
RBC: 3.84 MIL/uL — ABNORMAL LOW (ref 4.22–5.81)
RDW: 12 % (ref 11.5–15.5)
WBC: 7.3 10*3/uL (ref 4.0–10.5)
nRBC: 0 % (ref 0.0–0.2)

## 2019-05-31 LAB — PROTIME-INR
INR: 1.1 (ref 0.8–1.2)
Prothrombin Time: 13.7 seconds (ref 11.4–15.2)

## 2019-05-31 LAB — COMPREHENSIVE METABOLIC PANEL
ALT: 16 U/L (ref 0–44)
AST: 33 U/L (ref 15–41)
Albumin: 3 g/dL — ABNORMAL LOW (ref 3.5–5.0)
Alkaline Phosphatase: 56 U/L (ref 38–126)
Anion gap: 14 (ref 5–15)
BUN: 22 mg/dL (ref 8–23)
CO2: 23 mmol/L (ref 22–32)
Calcium: 7.8 mg/dL — ABNORMAL LOW (ref 8.9–10.3)
Chloride: 97 mmol/L — ABNORMAL LOW (ref 98–111)
Creatinine, Ser: 1.29 mg/dL — ABNORMAL HIGH (ref 0.61–1.24)
GFR calc Af Amer: >60 mL/min (ref >60)
GFR calc non Af Amer: 55 mL/min — ABNORMAL LOW (ref >60)
Glucose, Bld: 212 mg/dL — ABNORMAL HIGH (ref 70–99)
Potassium: 3.2 mmol/L — ABNORMAL LOW (ref 3.5–5.1)
Sodium: 134 mmol/L — ABNORMAL LOW (ref 135–145)
Total Bilirubin: 0.4 mg/dL (ref 0.3–1.2)
Total Protein: 7.4 g/dL (ref 6.5–8.1)

## 2019-05-31 LAB — LACTIC ACID, PLASMA: Lactic Acid, Venous: 1.5 mmol/L (ref 0.5–1.9)

## 2019-05-31 LAB — APTT: aPTT: 33 seconds (ref 24–36)

## 2019-05-31 MED ORDER — LEVOFLOXACIN 750 MG PO TABS: 750.0000 mg | ORAL_TABLET | Freq: Every day | ORAL | 0 refills | Status: DC

## 2019-05-31 MED ORDER — METRONIDAZOLE IN NACL 5-0.79 MG/ML-% IV SOLN
500.0000 mg | Freq: Once | INTRAVENOUS | Status: DC
  Filled 2019-05-31: qty 100

## 2019-05-31 MED ORDER — SODIUM CHLORIDE 0.9 % IV SOLN: INTRAVENOUS | Status: DC

## 2019-05-31 MED ORDER — ACETAMINOPHEN 160 MG/5ML PO SOLN
ORAL | Status: AC
  Filled 2019-05-31: qty 20.3

## 2019-05-31 MED ORDER — LEVOFLOXACIN IN D5W 750 MG/150ML IV SOLN
750.0000 mg | Freq: Once | INTRAVENOUS | Status: AC
  Administered 2019-05-31: 750 mg via INTRAVENOUS
  Filled 2019-05-31: qty 150

## 2019-05-31 MED ORDER — ACETAMINOPHEN 160 MG/5ML PO SOLN
650.0000 mg | Freq: Once | ORAL | Status: AC
  Administered 2019-05-31: 650 mg via ORAL

## 2019-05-31 MED ORDER — SODIUM CHLORIDE 0.9 % IV BOLUS
1000.0000 mL | Freq: Once | INTRAVENOUS | Status: AC
  Administered 2019-05-31: 1000 mL via INTRAVENOUS

## 2019-05-31 MED ORDER — CIPROFLOXACIN IN D5W 400 MG/200ML IV SOLN
400.0000 mg | Freq: Once | INTRAVENOUS | Status: DC
  Filled 2019-05-31: qty 200

## 2019-05-31 MED ORDER — SODIUM CHLORIDE 0.9 % IV SOLN
INTRAVENOUS | Status: DC | PRN
  Administered 2019-05-31: 250 mL via INTRAVENOUS

## 2019-05-31 NOTE — ED Provider Notes (Signed)
[facility] EMERGENCY DEPARTMENT Provider Note   CSN: [id number] Arrival date & time: 05/31/19  1832     History   Chief Complaint Chief Complaint  Patient presents with  . Nasal Congestion    HPI Ethan Pierce is a 73 y.o. male.     Patient is a 73 year old male with past medical history of diabetes, GERD, hypertension, bronchitis, and recent dental extraction.  He presents today with complaints of fever and palpitations.  Patient was seen 2 days ago at [facility] and underwent work-up, however no definite cause was found.  Patient was discharged to home.  He returns today with ongoing weakness, fever, and not feeling well.  He feels as if his heart is racing.  He was tested for Covid last week and was negative.  He had a Covid test performed 2 days ago at [facility], the results are pending.  He otherwise denies any urinary complaints, vomiting, diarrhea, or other issues.  He is concerned that he may have infection in his sinuses after the dental extraction 2 weeks ago, however MRI of the brain did not suggest this 2 days ago.  The history is provided by the patient.    Past Medical History:  Diagnosis Date  . Bronchitis   . Diabetes mellitus without complication (HCC)   . GERD (gastroesophageal reflux disease)   . Hypertension     Patient Active Problem List   Diagnosis Date Noted  . Lumbar radiculopathy 03/03/2018  . HTN (hypertension) 08/08/2012  . GERD (gastroesophageal reflux disease) 08/08/2012    History reviewed. No pertinent surgical history.      Home Medications    Prior to Admission medications   Medication Sig Start Date End Date Taking? Authorizing Provider  acetaminophen (TYLENOL) 160 MG/5ML elixir Take 15.6 mLs (500 mg total) by mouth every 4 (four) hours as needed for fever. 05/29/19   Arby Barrette, MD  atorvastatin (LIPITOR) 80 MG tablet Take 80 mg by mouth daily.    [provider]  BISACODYL PO Take by mouth.    [provider]  brompheniramine-pseudoephedrine-DM 30-2-10 MG/5ML syrup  09/20/18   [provider]  Calcipotriene 0.005 % solution  09/30/18   [provider]  Calcipotriene 0.005 % solution Apply to feet tid 10/12/18   Freddie Breech, DPM  chlorhexidine (PERIDEX) 0.12 % solution  06/06/18   [provider]  citalopram (CELEXA) 20 MG tablet Take 20 mg by mouth daily as needed (for anxiety).    [provider]  Cyanocobalamin (VITAMIN B 12 PO) Take by mouth.    [provider]  fluticasone (FLONASE) 50 MCG/ACT nasal spray Place into both nostrils daily.    [provider]  glimepiride (AMARYL) 4 MG tablet TAKE 1 TABLET BY MOUTH WITH BREAKFAST OR THE FIRST MAIN MEAL OF THE DAY ONCE A DAY (FOR DIABETES) 01/17/18   [provider]  meloxicam (MOBIC) 15 MG tablet meloxicam 15 mg tablet    [provider]  methocarbamol (ROBAXIN) 500 MG tablet methocarbamol 500 mg tablet    [provider]  ofloxacin (OCUFLOX) 0.3 % ophthalmic solution ofloxacin 0.3 % eye drops    [provider]  PARoxetine (PAXIL) 20 MG tablet Take 40 mg by mouth daily.     [provider]  prednisoLONE acetate (PRED FORTE) 1 % ophthalmic suspension prednisolone acetate 1 % eye drops,suspension    [provider]  traZODone (DESYREL) 100 MG tablet Take 100 mg by mouth  at bedtime as needed for sleep.    [provider]  TRIAMTERENE-HCTZ PO Take by mouth.    [provider]  urea (CARMOL) 20 % cream Apply topically at bedtime. For dry skin 05/03/18   Landis Martins, DPM  venlafaxine XR (EFFEXOR-XR) 37.5 MG 24 hr capsule  12/01/18   [provider]    Family History Family History  Problem Relation Age of Onset  . Diabetes Mother   . GER disease Mother   . Cancer Father        pancreas  . Diabetes Sister   . Diabetes Brother     Social History Social History   Tobacco Use  . Smoking  status: Never Smoker  . Smokeless tobacco: Never Used  Substance Use Topics  . Alcohol use: No  . Drug use: No     Allergies   Penicillins, Decongestant [pseudoephedrine hcl er], and Talwin [pentazocine]   Review of Systems Review of Systems  All other systems reviewed and are negative.    Physical Exam Updated Vital Signs BP (!) 156/78   Pulse (!) 141   Temp (!) 103.1 F (39.5 C)   Resp 18   Ht 5\' 8"  (1.727 m)   Wt 78 kg   SpO2 93%   BMI 26.15 kg/m   Physical Exam Vitals signs and nursing note reviewed.  Constitutional:      General: He is not in acute distress.    Appearance: He is well-developed. He is not diaphoretic.  HENT:     Head: Normocephalic and atraumatic.     Mouth/Throat:     Mouth: Mucous membranes are moist.     Comments: The dental extraction sites appear to be healing well.  There is no significant swelling or purulent drainage noted. Neck:     Musculoskeletal: Normal range of motion and neck supple.  Cardiovascular:     Rate and Rhythm: Regular rhythm. Tachycardia present.     Heart sounds: No murmur. No friction rub.  Pulmonary:     Effort: Pulmonary effort is normal. No respiratory distress.     Breath sounds: Normal breath sounds. No wheezing or rales.  Abdominal:     General: Bowel sounds are normal. There is no distension.     Palpations: Abdomen is soft.     Tenderness: There is no abdominal tenderness.  Musculoskeletal: Normal range of motion.  Skin:    General: Skin is warm and dry.  Neurological:     Mental Status: He is alert and oriented to person, place, and time.     Coordination: Coordination normal.      ED Treatments / Results  Labs (all labs ordered are listed, but only abnormal results are displayed) Labs Reviewed  CULTURE, BLOOD (ROUTINE X 2)  CULTURE, BLOOD (ROUTINE X 2)  URINE CULTURE  LACTIC ACID, PLASMA  LACTIC ACID, PLASMA  COMPREHENSIVE METABOLIC PANEL  CBC WITH DIFFERENTIAL/PLATELET  APTT   PROTIME-INR  URINALYSIS, ROUTINE W REFLEX MICROSCOPIC    EKG EKG Interpretation  Date/Time:  Wednesday May 31 2019 20:14:05 EDT Ventricular Rate:  104 PR Interval:    QRS Duration: 100 QT Interval:  346 QTC Calculation: 456 R Axis:   73 Text Interpretation: Sinus tachycardia ECG OTHERWISE WITHIN NORMAL LIMITS Confirmed by Veryl Speak [id number]) on 05/31/2019 8:36:56 PM   Radiology No results found.  Procedures Procedures (including critical care time)  Medications Ordered in ED Medications  acetaminophen (TYLENOL) 160 MG/5ML solution (has no administration in time range)  acetaminophen (TYLENOL) 160 MG/5ML solution 650 mg (650 mg Oral Given 05/31/19 1842)     Initial Impression / Assessment and Plan / ED Course  I have reviewed the triage vital signs and the nursing notes.  Pertinent labs & imaging results that were available during my care of the patient were reviewed by me and considered in my medical decision making (see chart for details).  Patient is a 73 year old male presenting with complaints of fever and congestion/headache.  This is been ongoing for the past 2 weeks and started shortly after having teeth pulled.  Patient was [facility] 2 evenings ago.  He had multiple studies performed and was ultimately discharged.  He returns here today febrile with a temp of 103 and heart rate of 140, concerning for a septic condition.  He did have a normal lactate and normal blood pressure, but his presentation was quite concerning.  Code sepsis was called and septic work-up was initiated.  Blood cultures were obtained as were cultures of the urine.  He has no elevation of white count and laboratory studies are otherwise essentially unremarkable.  In consultation with the pharmacist, the decision was made to give Levaquin to cover for possible dental etiology and what appears to be a pneumonia developing in the right lower lobe.  Patient was informed of these  findings and the plan was for him to be [facility].  I discussed the care with Dr. Julian ReilGardner who agreed to accept the patient in transfer.  Shortly after my conversation with Dr. Julian ReilGardner, I was informed by the nurse that the patient no longer wanted to be admitted and wanted to go home.  I returned to the room to discuss the situation with him.  He told me that he [facility] instead of staying in the hospital in [place].  I advised him that he was possibly septic and this could ultimately result in his death or serious disability.  He understands this and in my opinion has decision-making capacity to decline care.  Patient will sign out AGAINST MEDICAL ADVICE.  The conversation I had with him was witnessed by both his wife and Garment/textile technologistJennaya RN.  CRITICAL CARE Performed by: Geoffery Lyonsouglas Antoinett Dorman Total critical care time: 45 minutes Critical care time was exclusive of separately billable procedures and treating other patients. Critical care was necessary to treat or prevent imminent or life-threatening deterioration. Critical care was time spent personally by me on the following activities: development of treatment plan with patient and/or surrogate as well as nursing, discussions with consultants, evaluation of patient's response to treatment, examination of patient, obtaining history from patient or surrogate, ordering and performing treatments and interventions, ordering and review of laboratory studies, ordering and review of radiographic studies, pulse oximetry and re-evaluation of patient's condition.   Final Clinical Impressions(s) / ED Diagnoses   Final diagnoses:  None    ED Discharge Orders    None       Geoffery Lyonselo, Jobanny Mavis, MD 05/31/19 2252

## 2019-05-31 NOTE — Plan of Care (Signed)
73 yo M with fever, generalized weakness, palpitations.  Found to have sepsis with Tm 103.1, HR 141, RR 27.  COVID test from PCP x2 days ago still pending, being redone at [facility].  CXR shows RLL PNA.  Levaquin in ED.  Vitals improved for the moment.  Will put in as PUI, SDU.

## 2019-05-31 NOTE — Discharge Instructions (Signed)
Begin taking Levaquin as prescribed this evening.  You are welcome to return to the emergency department should you desire further treatment and the admission that you were offered this evening.  By leaving [place], you understand that leaving the hospital could result in your own death and or serious disability.

## 2019-05-31 NOTE — ED Triage Notes (Signed)
Pt c/o sinus congestion, h/a x x 2 weeks , seen at [facility] x 2 days ago and PMD yesterday , covid test done with no results

## 2019-06-01 ENCOUNTER — Emergency Department (HOSPITAL_COMMUNITY): Payer: No Typology Code available for payment source

## 2019-06-01 ENCOUNTER — Encounter (HOSPITAL_COMMUNITY): Payer: Self-pay | Admitting: Emergency Medicine

## 2019-06-01 ENCOUNTER — Other Ambulatory Visit: Payer: Self-pay

## 2019-06-01 ENCOUNTER — Emergency Department (HOSPITAL_COMMUNITY)
Admission: EM | Admit: 2019-06-01 | Discharge: 2019-06-02 | Disposition: A | Discharge status: Home / Self Care | Payer: No Typology Code available for payment source | Attending: Emergency Medicine | Admitting: Emergency Medicine

## 2019-06-01 DIAGNOSIS — I10 Essential (primary) hypertension: Secondary | ICD-10-CM | POA: Insufficient documentation

## 2019-06-01 DIAGNOSIS — U071 COVID-19: Secondary | ICD-10-CM | POA: Insufficient documentation

## 2019-06-01 DIAGNOSIS — Z79899 Other long term (current) drug therapy: Secondary | ICD-10-CM | POA: Diagnosis not present

## 2019-06-01 DIAGNOSIS — J159 Unspecified bacterial pneumonia: Secondary | ICD-10-CM | POA: Insufficient documentation

## 2019-06-01 DIAGNOSIS — Z791 Long term (current) use of non-steroidal anti-inflammatories (NSAID): Secondary | ICD-10-CM | POA: Diagnosis not present

## 2019-06-01 DIAGNOSIS — E119 Type 2 diabetes mellitus without complications: Secondary | ICD-10-CM | POA: Diagnosis not present

## 2019-06-01 LAB — CBC WITH DIFFERENTIAL/PLATELET
Abs Immature Granulocytes: 0.04 10*3/uL (ref 0.00–0.07)
Basophils Absolute: 0 10*3/uL (ref 0.0–0.1)
Basophils Relative: 0 %
Eosinophils Absolute: 0 10*3/uL (ref 0.0–0.5)
Eosinophils Relative: 0 %
HCT: 35.3 % — ABNORMAL LOW (ref 39.0–52.0)
Hemoglobin: 11.8 g/dL — ABNORMAL LOW (ref 13.0–17.0)
Immature Granulocytes: 0 %
Lymphocytes Relative: 5 %
Lymphs Abs: 0.5 10*3/uL — ABNORMAL LOW (ref 0.7–4.0)
MCH: 31 pg (ref 26.0–34.0)
MCHC: 33.4 g/dL (ref 30.0–36.0)
MCV: 92.7 fL (ref 80.0–100.0)
Monocytes Absolute: 0.7 10*3/uL (ref 0.1–1.0)
Monocytes Relative: 7 %
Neutro Abs: 8.3 10*3/uL — ABNORMAL HIGH (ref 1.7–7.7)
Neutrophils Relative %: 88 %
Platelets: 204 10*3/uL (ref 150–400)
RBC: 3.81 MIL/uL — ABNORMAL LOW (ref 4.22–5.81)
RDW: 12.1 % (ref 11.5–15.5)
WBC: 9.5 10*3/uL (ref 4.0–10.5)
nRBC: 0 % (ref 0.0–0.2)

## 2019-06-01 LAB — NOVEL CORONAVIRUS, NAA: SARS-CoV-2, NAA: DETECTED — AB

## 2019-06-01 MED FILL — levoFLOXacin 750 MG TABS: 750 | 7 days supply | Qty: 7 | Fill #0

## 2019-06-01 NOTE — ED Notes (Signed)
EDP at bedside  

## 2019-06-01 NOTE — ED Provider Notes (Signed)
[facility]-EMERGENCY DEPT Provider Note: Ethan Dell, MD, FACEP  CSN: [id number] MRN: [id number] ARRIVAL: 06/01/19 at 2045 ROOM: WA11/WA11   CHIEF COMPLAINT  Covid+   HISTORY OF PRESENT ILLNESS  06/01/19 11:04 PM Ethan Pierce is a 73 y.o. male who was seen at [facility] yesterday for shortness of breath and fever for 1 week.  He was diagnosed with a pneumonia, presumably bacterial, and was started on Levaquin.  He has subsequently tested positive for COVID-19.  He was supposed to be admitted yesterday for sepsis but decided to leave AMA.  He took his first dose of levofloxacin this morning.  He returns "to see if the antibiotic is working ".  He states he feels "great".  He denies shortness of breath, weakness, pain or other complaint.  He fell in the parking lot on arrival and injured his left knee.  He denies pain in his left knee, states he can weight-bear on it without difficulty, and does not want to have it x-rayed.  Nursing staff tells me he believes his Covid diagnosis is "a lie".    Past Medical History:  Diagnosis Date  . Bronchitis   . Diabetes mellitus without complication (HCC)   . GERD (gastroesophageal reflux disease)   . Hypertension     History reviewed. No pertinent surgical history.  Family History  Problem Relation Age of Onset  . Diabetes Mother   . GER disease Mother   . Cancer Father        pancreas  . Diabetes Sister   . Diabetes Brother     Social History   Tobacco Use  . Smoking status: Never Smoker  . Smokeless tobacco: Never Used  Substance Use Topics  . Alcohol use: No  . Drug use: No    Prior to Admission medications   Medication Sig Start Date End Date Taking? Authorizing Provider  acetaminophen (TYLENOL) 160 MG/5ML elixir Take 15.6 mLs (500 mg total) by mouth every 4 (four) hours as needed for fever. 05/29/19   Arby Barrette, MD  atorvastatin (LIPITOR) 80 MG tablet Take 80 mg by mouth daily.    [provider]   BISACODYL PO Take by mouth.    [provider]  brompheniramine-pseudoephedrine-DM 30-2-10 MG/5ML syrup  09/20/18   [provider]  Calcipotriene 0.005 % solution  09/30/18   [provider]  Calcipotriene 0.005 % solution Apply to feet tid 10/12/18   Freddie Breech, DPM  chlorhexidine (PERIDEX) 0.12 % solution  06/06/18   [provider]  citalopram (CELEXA) 20 MG tablet Take 20 mg by mouth daily as needed (for anxiety).    [provider]  Cyanocobalamin (VITAMIN B 12 PO) Take by mouth.    [provider]  fluticasone (FLONASE) 50 MCG/ACT nasal spray Place into both nostrils daily.    [provider]  glimepiride (AMARYL) 4 MG tablet TAKE 1 TABLET BY MOUTH WITH BREAKFAST OR THE FIRST MAIN MEAL OF THE DAY ONCE A DAY (FOR DIABETES) 01/17/18   [provider]  levofloxacin (LEVAQUIN) 750 MG tablet Take 1 tablet (750 mg total) by mouth daily. X 7 days 05/31/19   Geoffery Lyons, MD  meloxicam (MOBIC) 15 MG tablet meloxicam 15 mg tablet    [provider]  methocarbamol (ROBAXIN) 500 MG tablet methocarbamol 500 mg tablet    [provider]  ofloxacin (OCUFLOX) 0.3 % ophthalmic solution ofloxacin 0.3 % eye drops    [provider]  PARoxetine (  PAXIL) 20 MG tablet Take 40 mg by mouth daily.     [provider]  prednisoLONE acetate (PRED FORTE) 1 % ophthalmic suspension prednisolone acetate 1 % eye drops,suspension    [provider]  traZODone (DESYREL) 100 MG tablet Take 100 mg by mouth at bedtime as needed for sleep.    [provider]  TRIAMTERENE-HCTZ PO Take by mouth.    [provider]  urea (CARMOL) 20 % cream Apply topically at bedtime. For dry skin 05/03/18   Asencion IslamStover, Titorya, DPM  venlafaxine XR (EFFEXOR-XR) 37.5 MG 24 hr capsule  12/01/18   [provider]    Allergies Penicillins, Decongestant [pseudoephedrine hcl er], and Talwin [pentazocine]    REVIEW OF SYSTEMS  Negative except as noted here or in the History of Present Illness.   PHYSICAL EXAMINATION  Initial Vital Signs Blood pressure (!) 146/62, pulse 97, temperature 98.1 F (36.7 C), temperature source Oral, resp. rate 16, SpO2 96 %.  Examination General: Well-developed, well-nourished male in no acute distress; appearance consistent with age of record HENT: normocephalic; atraumatic Eyes: pupils equal, round and reactive to light; extraocular muscles intact; arcus senilis bilaterally; bilateral pseudophakia Neck: supple Heart: regular rate and rhythm Lungs: clear to auscultation bilaterally Abdomen: soft; nondistended; nontender; bowel sounds present Extremities: No deformity; full range of motion; pulses normal; left knee without tenderness or swelling Neurologic: Awake, alert and oriented; motor function intact in all extremities and symmetric; no facial droop Skin: Warm and dry Psychiatric: Normal mood and affect   RESULTS  Summary of this visit's results, reviewed and interpreted by myself:   EKG Interpretation  Date/Time:    Ventricular Rate:    PR Interval:    QRS Duration:   QT Interval:    QTC Calculation:   R Axis:     Text Interpretation:        Laboratory Studies: Results for orders placed or performed during the hospital encounter of 06/01/19 (from the past 24 hour(s))  Procalcitonin     Status: None   Collection Time: 06/01/19 11:30 PM  Result Value Ref Range   Procalcitonin 0.18 ng/mL  CBC with Differential/Platelet     Status: Abnormal   Collection Time: 06/01/19 11:30 PM  Result Value Ref Range   WBC 9.5 4.0 - 10.5 K/uL   RBC 3.81 (L) 4.22 - 5.81 MIL/uL   Hemoglobin 11.8 (L) 13.0 - 17.0 g/dL   HCT 16.135.3 (L) 09.639.0 - 04.552.0 %   MCV 92.7 80.0 - 100.0 fL   MCH 31.0 26.0 - 34.0 pg   MCHC 33.4 30.0 - 36.0 g/dL   RDW 40.912.1 81.111.5 - 91.415.5 %   Platelets 204 150 - 400 K/uL   nRBC 0.0 0.0 - 0.2 %   Neutrophils Relative % 88 %   Neutro Abs 8.3  (H) 1.7 - 7.7 K/uL   Lymphocytes Relative 5 %   Lymphs Abs 0.5 (L) 0.7 - 4.0 K/uL   Monocytes Relative 7 %   Monocytes Absolute 0.7 0.1 - 1.0 K/uL   Eosinophils Relative 0 %   Eosinophils Absolute 0.0 0.0 - 0.5 K/uL   Basophils Relative 0 %   Basophils Absolute 0.0 0.0 - 0.1 K/uL   Immature Granulocytes 0 %   Abs Immature Granulocytes 0.04 0.00 - 0.07 K/uL  Basic metabolic panel     Status: Abnormal   Collection Time: 06/01/19 11:30 PM  Result Value Ref Range   Sodium 136 135 - 145 mmol/L   Potassium 3.7  3.5 - 5.1 mmol/L   Chloride 101 98 - 111 mmol/L   CO2 24 22 - 32 mmol/L   Glucose, Bld 197 (H) 70 - 99 mg/dL   BUN 16 8 - 23 mg/dL   Creatinine, Ser 1.14 0.61 - 1.24 mg/dL   Calcium 8.3 (L) 8.9 - 10.3 mg/dL   GFR calc non Af Amer >60 >60 mL/min   GFR calc Af Amer >60 >60 mL/min   Anion gap 11 5 - 15   Imaging Studies: Dg Chest Port 1 View  Result Date: 06/02/2019 CLINICAL DATA:  History of COVID-19 positivity with shortness of breath and fevers EXAM: PORTABLE CHEST 1 VIEW COMPARISON:  05/31/2019 FINDINGS: Cardiac shadow is stable. The previously seen right basilar opacity is again noted. No new focal infiltrate is seen. No bony abnormality is noted. IMPRESSION: Stable right basilar opacity. Electronically Signed   By: Inez Catalina M.D.   On: 06/02/2019 00:10   Dg Chest Port 1 View  Result Date: 05/31/2019 CLINICAL DATA:  Fevers with headaches EXAM: PORTABLE CHEST 1 VIEW COMPARISON:  01/12/2015 FINDINGS: Cardiac shadow is stable. The lungs are well aerated bilaterally. Mild increased density is noted in the right lung base consistent with early infiltrate. No sizable effusion is noted. Degenerative changes of the thoracic spine are again seen. IMPRESSION: Mild early infiltrate in the medial right lung base. Electronically Signed   By: Inez Catalina M.D.   On: 05/31/2019 20:11    ED COURSE and MDM  Nursing notes, initial and subsequent vitals signs, including pulse oximetry,  reviewed and interpreted by myself.  Vitals:   06/01/19 2138 06/01/19 2139 06/01/19 2330 06/01/19 2337  BP:  (!) 146/62 (!) 154/71   Pulse: 97 97 99   Resp:   18   Temp:    98.6 F (37 C)  TempSrc:    Oral  SpO2: 96% 96% 97%    Medications - No data to display  12:44 AM Patient clinically nontoxic-appearing.  His infiltrate is stable.  He was advised to continue the Levaquin.  Although he denies that he has COVID-19 his test is indeed positive.  He intends to follow-up with the [facility] hospital later today.  As he has been sick for over a week he may no longer be contagious for COVID-19.  PROCEDURES  Procedures   ED DIAGNOSES     ICD-10-CM   1. COVID-19 virus infection  U07.1   2. Secondary bacterial pneumonia  J15.9        Zuzanna Maroney, Jenny Reichmann, MD 06/02/19 [phone]

## 2019-06-01 NOTE — ED Triage Notes (Signed)
Patient BIB wife, reports SOB and fever x1 week. Dx with pneumonia last night. Reports syncopal episodes today. Patient on ground by car and assisted to wheel chair by this Probation officer and Horris Latino EMT. Patient c/o left knee pain after fall.

## 2019-06-02 LAB — BASIC METABOLIC PANEL
Anion gap: 11 (ref 5–15)
BUN: 16 mg/dL (ref 8–23)
CO2: 24 mmol/L (ref 22–32)
Calcium: 8.3 mg/dL — ABNORMAL LOW (ref 8.9–10.3)
Chloride: 101 mmol/L (ref 98–111)
Creatinine, Ser: 1.14 mg/dL (ref 0.61–1.24)
GFR calc Af Amer: >60 mL/min (ref >60)
GFR calc non Af Amer: >60 mL/min (ref >60)
Glucose, Bld: 197 mg/dL — ABNORMAL HIGH (ref 70–99)
Potassium: 3.7 mmol/L (ref 3.5–5.1)
Sodium: 136 mmol/L (ref 135–145)

## 2019-06-02 LAB — PROCALCITONIN: Procalcitonin: 0.18 ng/mL

## 2019-06-03 ENCOUNTER — Inpatient Hospital Stay (HOSPITAL_COMMUNITY)
Admission: EM | Admit: 2019-06-03 | Discharge: 2019-06-08 | DRG: 177 | Disposition: A | Discharge status: Home / Self Care | Payer: No Typology Code available for payment source | Attending: Internal Medicine | Admitting: Internal Medicine

## 2019-06-03 ENCOUNTER — Encounter (HOSPITAL_COMMUNITY): Payer: Self-pay

## 2019-06-03 ENCOUNTER — Emergency Department (HOSPITAL_COMMUNITY): Payer: No Typology Code available for payment source

## 2019-06-03 ENCOUNTER — Other Ambulatory Visit: Payer: Self-pay

## 2019-06-03 ENCOUNTER — Emergency Department (EMERGENCY_DEPARTMENT_HOSPITAL)
Admission: EM | Admit: 2019-06-03 | Discharge: 2019-06-03 | Discharge status: Against Medical Advice | Payer: No Typology Code available for payment source | Source: Home / Self Care | Attending: Emergency Medicine | Admitting: Emergency Medicine

## 2019-06-03 DIAGNOSIS — K219 Gastro-esophageal reflux disease without esophagitis: Secondary | ICD-10-CM | POA: Diagnosis present

## 2019-06-03 DIAGNOSIS — J9601 Acute respiratory failure with hypoxia: Secondary | ICD-10-CM | POA: Diagnosis present

## 2019-06-03 DIAGNOSIS — I10 Essential (primary) hypertension: Secondary | ICD-10-CM | POA: Insufficient documentation

## 2019-06-03 DIAGNOSIS — Z833 Family history of diabetes mellitus: Secondary | ICD-10-CM | POA: Diagnosis not present

## 2019-06-03 DIAGNOSIS — Z880 Allergy status to penicillin: Secondary | ICD-10-CM

## 2019-06-03 DIAGNOSIS — Z9181 History of falling: Secondary | ICD-10-CM

## 2019-06-03 DIAGNOSIS — R000 Tachycardia, unspecified: Secondary | ICD-10-CM | POA: Diagnosis not present

## 2019-06-03 DIAGNOSIS — F40240 Claustrophobia: Secondary | ICD-10-CM | POA: Diagnosis present

## 2019-06-03 DIAGNOSIS — I152 Hypertension secondary to endocrine disorders: Secondary | ICD-10-CM | POA: Diagnosis present

## 2019-06-03 DIAGNOSIS — E1159 Type 2 diabetes mellitus with other circulatory complications: Secondary | ICD-10-CM | POA: Diagnosis present

## 2019-06-03 DIAGNOSIS — J1289 Other viral pneumonia: Secondary | ICD-10-CM | POA: Diagnosis not present

## 2019-06-03 DIAGNOSIS — R0602 Shortness of breath: Secondary | ICD-10-CM | POA: Insufficient documentation

## 2019-06-03 DIAGNOSIS — Z79899 Other long term (current) drug therapy: Secondary | ICD-10-CM | POA: Insufficient documentation

## 2019-06-03 DIAGNOSIS — Y9289 Other specified places as the place of occurrence of the external cause: Secondary | ICD-10-CM | POA: Insufficient documentation

## 2019-06-03 DIAGNOSIS — S8002XA Contusion of left knee, initial encounter: Secondary | ICD-10-CM

## 2019-06-03 DIAGNOSIS — Y9301 Activity, walking, marching and hiking: Secondary | ICD-10-CM | POA: Insufficient documentation

## 2019-06-03 DIAGNOSIS — Z888 Allergy status to other drugs, medicaments and biological substances status: Secondary | ICD-10-CM | POA: Diagnosis not present

## 2019-06-03 DIAGNOSIS — J1282 Pneumonia due to coronavirus disease 2019: Secondary | ICD-10-CM

## 2019-06-03 DIAGNOSIS — IMO0002 Reserved for concepts with insufficient information to code with codable children: Secondary | ICD-10-CM | POA: Diagnosis present

## 2019-06-03 DIAGNOSIS — J189 Pneumonia, unspecified organism: Secondary | ICD-10-CM | POA: Insufficient documentation

## 2019-06-03 DIAGNOSIS — E119 Type 2 diabetes mellitus without complications: Secondary | ICD-10-CM | POA: Insufficient documentation

## 2019-06-03 DIAGNOSIS — Z7984 Long term (current) use of oral hypoglycemic drugs: Secondary | ICD-10-CM | POA: Insufficient documentation

## 2019-06-03 DIAGNOSIS — U071 COVID-19: Principal | ICD-10-CM

## 2019-06-03 DIAGNOSIS — W19XXXA Unspecified fall, initial encounter: Secondary | ICD-10-CM | POA: Insufficient documentation

## 2019-06-03 DIAGNOSIS — E1165 Type 2 diabetes mellitus with hyperglycemia: Secondary | ICD-10-CM | POA: Diagnosis not present

## 2019-06-03 DIAGNOSIS — R55 Syncope and collapse: Secondary | ICD-10-CM

## 2019-06-03 DIAGNOSIS — E861 Hypovolemia: Secondary | ICD-10-CM | POA: Diagnosis present

## 2019-06-03 DIAGNOSIS — E860 Dehydration: Secondary | ICD-10-CM | POA: Diagnosis present

## 2019-06-03 DIAGNOSIS — W19XXXD Unspecified fall, subsequent encounter: Secondary | ICD-10-CM | POA: Diagnosis not present

## 2019-06-03 DIAGNOSIS — E118 Type 2 diabetes mellitus with unspecified complications: Secondary | ICD-10-CM | POA: Diagnosis not present

## 2019-06-03 DIAGNOSIS — Y998 Other external cause status: Secondary | ICD-10-CM | POA: Insufficient documentation

## 2019-06-03 DIAGNOSIS — E1169 Type 2 diabetes mellitus with other specified complication: Secondary | ICD-10-CM | POA: Diagnosis present

## 2019-06-03 DIAGNOSIS — Z809 Family history of malignant neoplasm, unspecified: Secondary | ICD-10-CM | POA: Diagnosis not present

## 2019-06-03 DIAGNOSIS — R519 Headache, unspecified: Secondary | ICD-10-CM | POA: Insufficient documentation

## 2019-06-03 DIAGNOSIS — R4020 Unspecified coma: Secondary | ICD-10-CM | POA: Diagnosis not present

## 2019-06-03 HISTORY — DX: Pneumonia due to coronavirus disease 2019: J12.82

## 2019-06-03 LAB — CBC
HCT: 35.3 % — ABNORMAL LOW (ref 39.0–52.0)
Hemoglobin: 11.4 g/dL — ABNORMAL LOW (ref 13.0–17.0)
MCH: 30.3 pg (ref 26.0–34.0)
MCHC: 32.3 g/dL (ref 30.0–36.0)
MCV: 93.9 fL (ref 80.0–100.0)
Platelets: 284 10*3/uL (ref 150–400)
RBC: 3.76 MIL/uL — ABNORMAL LOW (ref 4.22–5.81)
RDW: 12.1 % (ref 11.5–15.5)
WBC: 12.2 10*3/uL — ABNORMAL HIGH (ref 4.0–10.5)
nRBC: 0 % (ref 0.0–0.2)

## 2019-06-03 LAB — CBG MONITORING, ED: Glucose-Capillary: 141 mg/dL — ABNORMAL HIGH (ref 70–99)

## 2019-06-03 LAB — BASIC METABOLIC PANEL
Anion gap: 12 (ref 5–15)
BUN: 15 mg/dL (ref 8–23)
CO2: 25 mmol/L (ref 22–32)
Calcium: 8.2 mg/dL — ABNORMAL LOW (ref 8.9–10.3)
Chloride: 98 mmol/L (ref 98–111)
Creatinine, Ser: 1.08 mg/dL (ref 0.61–1.24)
GFR calc Af Amer: >60 mL/min (ref >60)
GFR calc non Af Amer: >60 mL/min (ref >60)
Glucose, Bld: 154 mg/dL — ABNORMAL HIGH (ref 70–99)
Potassium: 3.5 mmol/L (ref 3.5–5.1)
Sodium: 135 mmol/L (ref 135–145)

## 2019-06-03 LAB — D-DIMER, QUANTITATIVE: D-Dimer, Quant: 0.97 ug/mL-FEU — ABNORMAL HIGH (ref 0.00–0.50)

## 2019-06-03 LAB — LACTATE DEHYDROGENASE: LDH: 349 U/L — ABNORMAL HIGH (ref 98–192)

## 2019-06-03 LAB — FERRITIN: Ferritin: 442 ng/mL — ABNORMAL HIGH (ref 24–336)

## 2019-06-03 LAB — C-REACTIVE PROTEIN: CRP: 21.7 mg/dL — ABNORMAL HIGH (ref <1.0)

## 2019-06-03 LAB — LACTIC ACID, PLASMA: Lactic Acid, Venous: 1.9 mmol/L (ref 0.5–1.9)

## 2019-06-03 LAB — TRIGLYCERIDES: Triglycerides: 18 mg/dL (ref <150)

## 2019-06-03 LAB — FIBRINOGEN: Fibrinogen: 730 mg/dL — ABNORMAL HIGH (ref 210–475)

## 2019-06-03 LAB — PROCALCITONIN: Procalcitonin: 0.24 ng/mL

## 2019-06-03 LAB — ABO/RH: ABO/RH(D): B POS

## 2019-06-03 MED ORDER — SODIUM CHLORIDE 0.9 % IV BOLUS
500.0000 mL | Freq: Once | INTRAVENOUS | Status: AC
  Administered 2019-06-03: 500 mL via INTRAVENOUS

## 2019-06-03 MED ORDER — ACETAMINOPHEN 325 MG PO TABS
650.0000 mg | ORAL_TABLET | Freq: Once | ORAL | Status: AC
  Administered 2019-06-03: 325 mg via ORAL
  Filled 2019-06-03: qty 2

## 2019-06-03 MED ORDER — ONDANSETRON HCL 4 MG PO TABS: 4.0000 mg | ORAL_TABLET | Freq: Four times a day (QID) | ORAL | Status: DC | PRN

## 2019-06-03 MED ORDER — SENNOSIDES-DOCUSATE SODIUM 8.6-50 MG PO TABS: 1.0000 | ORAL_TABLET | Freq: Every evening | ORAL | Status: DC | PRN

## 2019-06-03 MED ORDER — LISINOPRIL 20 MG PO TABS
20.0000 mg | ORAL_TABLET | Freq: Every day | ORAL | Status: DC
  Administered 2019-06-04 – 2019-06-08 (×5): 20 mg via ORAL
  Filled 2019-06-03 (×5): qty 1

## 2019-06-03 MED ORDER — METHYLPREDNISOLONE SODIUM SUCC 40 MG IJ SOLR
0.5000 mg/kg | Freq: Two times a day (BID) | INTRAMUSCULAR | Status: DC
  Administered 2019-06-03 – 2019-06-04 (×2): 39.2 mg via INTRAVENOUS
  Filled 2019-06-03 (×2): qty 1

## 2019-06-03 MED ORDER — ONDANSETRON HCL 4 MG/2ML IJ SOLN: 4.0000 mg | Freq: Four times a day (QID) | INTRAMUSCULAR | Status: DC | PRN

## 2019-06-03 MED ORDER — ACETAMINOPHEN 325 MG PO TABS
ORAL_TABLET | ORAL | Status: AC
  Administered 2019-06-03: 14:00:00
  Filled 2019-06-03: qty 1

## 2019-06-03 MED ORDER — SODIUM CHLORIDE 0.9 % IV SOLN
200.0000 mg | Freq: Once | INTRAVENOUS | Status: AC
  Administered 2019-06-03: 200 mg via INTRAVENOUS
  Filled 2019-06-03: qty 40

## 2019-06-03 MED ORDER — ACETAMINOPHEN 325 MG PO TABS
650.0000 mg | ORAL_TABLET | Freq: Four times a day (QID) | ORAL | Status: DC | PRN
  Filled 2019-06-03: qty 2

## 2019-06-03 MED ORDER — ALBUTEROL SULFATE HFA 108 (90 BASE) MCG/ACT IN AERS
2.0000 | INHALATION_SPRAY | Freq: Four times a day (QID) | RESPIRATORY_TRACT | Status: DC
  Administered 2019-06-04 – 2019-06-08 (×16): 2 via RESPIRATORY_TRACT
  Filled 2019-06-03: qty 6.7

## 2019-06-03 MED ORDER — ZINC SULFATE 220 (50 ZN) MG PO CAPS
220.0000 mg | ORAL_CAPSULE | Freq: Every day | ORAL | Status: DC
  Administered 2019-06-04 – 2019-06-08 (×6): 220 mg via ORAL
  Filled 2019-06-03 (×6): qty 1

## 2019-06-03 MED ORDER — VITAMIN C 500 MG PO TABS
500.0000 mg | ORAL_TABLET | Freq: Every day | ORAL | Status: DC
  Administered 2019-06-04 – 2019-06-08 (×6): 500 mg via ORAL
  Filled 2019-06-03 (×6): qty 1

## 2019-06-03 MED ORDER — HYDROCHLOROTHIAZIDE 25 MG PO TABS
25.0000 mg | ORAL_TABLET | Freq: Every day | ORAL | Status: DC
  Administered 2019-06-04 – 2019-06-08 (×5): 25 mg via ORAL
  Filled 2019-06-03 (×5): qty 1

## 2019-06-03 MED ORDER — LISINOPRIL-HYDROCHLOROTHIAZIDE 20-25 MG PO TABS: 1.0000 | ORAL_TABLET | Freq: Every day | ORAL | Status: DC

## 2019-06-03 MED ORDER — GUAIFENESIN-DM 100-10 MG/5ML PO SYRP: 10.0000 mL | ORAL_SOLUTION | ORAL | Status: DC | PRN

## 2019-06-03 MED ORDER — SODIUM CHLORIDE 0.9 % IV SOLN
INTRAVENOUS | Status: AC
  Administered 2019-06-03 – 2019-06-04 (×2): via INTRAVENOUS

## 2019-06-03 MED ORDER — SODIUM CHLORIDE 0.9 % IV SOLN
100.0000 mg | INTRAVENOUS | Status: DC
  Filled 2019-06-03: qty 20

## 2019-06-03 MED ORDER — ACETAMINOPHEN 325 MG PO TABS
650.0000 mg | ORAL_TABLET | Freq: Once | ORAL | Status: AC
  Administered 2019-06-03: 20:00:00 650 mg via ORAL
  Filled 2019-06-03: qty 2

## 2019-06-03 MED ORDER — ENOXAPARIN SODIUM 40 MG/0.4ML ~~LOC~~ SOLN
40.0000 mg | Freq: Every day | SUBCUTANEOUS | Status: DC
  Administered 2019-06-04 – 2019-06-07 (×5): 40 mg via SUBCUTANEOUS
  Filled 2019-06-03 (×5): qty 0.4

## 2019-06-03 MED ORDER — HYDROCOD POLST-CPM POLST ER 10-8 MG/5ML PO SUER
5.0000 mL | Freq: Two times a day (BID) | ORAL | Status: DC | PRN
  Administered 2019-06-04 – 2019-06-07 (×5): 5 mL via ORAL
  Filled 2019-06-03 (×6): qty 5

## 2019-06-03 MED ORDER — LATANOPROST 0.005 % OP SOLN
1.0000 [drp] | Freq: Every day | OPHTHALMIC | Status: DC
  Administered 2019-06-04 – 2019-06-07 (×4): 1 [drp] via OPHTHALMIC
  Filled 2019-06-03 (×2): qty 2.5

## 2019-06-03 MED ORDER — INSULIN ASPART 100 UNIT/ML ~~LOC~~ SOLN
0.0000 [IU] | Freq: Three times a day (TID) | SUBCUTANEOUS | Status: DC
  Administered 2019-06-04: 3 [IU] via SUBCUTANEOUS
  Administered 2019-06-04 (×2): 2 [IU] via SUBCUTANEOUS
  Administered 2019-06-05 (×2): 3 [IU] via SUBCUTANEOUS
  Filled 2019-06-03: qty 0.09

## 2019-06-03 NOTE — ED Provider Notes (Signed)
[facility]-EMERGENCY DEPT Provider Note   CSN: [id number] Arrival date & time: 06/03/19  1947     History   Chief Complaint No chief complaint on file.   HPI Ethan Pierce Risk is a 73 y.o. male.  Patient was just seen by me and discharged.  He has Covid, shortness of breath and had a recent fall possibly syncopal event earlier today.  He was refusing admission to the hospital because he did not want to be placed on [facility].  He signed out AMA and upon leaving the emergency department had difficulty walking and fell injuring his left knee.  He was able to talk to somebody in administration and it sounds like he has a bed available on [facility] for his admission.  No other change since his prior note other than the left knee injury.     The history is provided by the patient.  Fall This is a new problem. The current episode started less than 1 hour ago. The problem occurs constantly. The problem has not changed since onset.Associated symptoms include shortness of breath. Pertinent negatives include no chest pain, no abdominal pain and no headaches.    Past Medical History:  Diagnosis Date  . Bronchitis   . Diabetes mellitus without complication (HCC)   . GERD (gastroesophageal reflux disease)   . Hypertension     Patient Active Problem List   Diagnosis Date Noted  . Sepsis due to pneumonia (HCC) 05/31/2019  . Lumbar radiculopathy 03/03/2018  . HTN (hypertension) 08/08/2012  . GERD (gastroesophageal reflux disease) 08/08/2012    No past surgical history on file.      Home Medications    Prior to Admission medications   Medication Sig Start Date End Date Taking? Authorizing Provider  acetaminophen (TYLENOL) 160 MG/5ML elixir Take 15.6 mLs (500 mg total) by mouth every 4 (four) hours as needed for fever. 05/29/19   Arby BarrettePfeiffer, Marcy, MD  bimatoprost (LUMIGAN) 0.03 % ophthalmic solution Place 1 drop into both eyes at bedtime.     [provider]  levofloxacin (LEVAQUIN) 750 MG tablet Take 1 tablet (750 mg total) by mouth daily. X 7 days 05/31/19   Geoffery Lyonselo, Douglas, MD  lisinopril-hydrochlorothiazide (ZESTORETIC) 20-25 MG tablet Take 1 tablet by mouth daily.    [provider]  OVER THE COUNTER MEDICATION Take 1 packet by mouth daily.    [provider]  Polyethyl Glycol-Propyl Glycol (SYSTANE OP) Apply 1 drop to eye daily as needed (dry eyes).    [provider]    Family History Family History  Problem Relation Age of Onset  . Diabetes Mother   . GER disease Mother   . Cancer Father        pancreas  . Diabetes Sister   . Diabetes Brother     Social History Social History   Tobacco Use  . Smoking status: Never Smoker  . Smokeless tobacco: Never Used  Substance Use Topics  . Alcohol use: No  . Drug use: No     Allergies   Penicillins, Decongestant [pseudoephedrine hcl er], and Talwin [pentazocine]   Review of Systems Review of Systems  Constitutional: Positive for fatigue and fever. Negative for diaphoresis.  HENT: Negative for sore throat.   Eyes: Negative for visual disturbance.  Respiratory: Positive for cough and shortness of breath.   Cardiovascular: Negative for chest pain.  Gastrointestinal: Negative for abdominal pain.  Genitourinary: Negative for dysuria.  Musculoskeletal: Negative for back pain.  Skin: Positive for wound. Negative for rash.  Neurological: Negative for headaches.     Physical Exam Updated Vital Signs BP (!) 162/71   Pulse (!) 110   Temp (!) 101.6 F (38.7 C) (Oral)   Resp (!) 24   SpO2 94%   Physical Exam Vitals signs and nursing note reviewed.  Constitutional:      Appearance: He is well-developed.  HENT:     Head: Normocephalic and atraumatic.  Eyes:     Conjunctiva/sclera: Conjunctivae normal.  Neck:     Musculoskeletal: Neck supple.  Cardiovascular:     Rate and Rhythm: Regular rhythm. Tachycardia present.      Heart sounds: No murmur.  Pulmonary:     Effort: Pulmonary effort is normal. Tachypnea present. No respiratory distress.     Breath sounds: Normal breath sounds.  Abdominal:     Palpations: Abdomen is soft.     Tenderness: There is no abdominal tenderness.  Musculoskeletal: Normal range of motion.        General: Tenderness and signs of injury present. No deformity.     Right lower leg: No edema.     Left lower leg: No edema.     Comments: Is got some tenderness and abrasions of his left knee.  Full range of motion.  Skin:    General: Skin is warm and dry.     Capillary Refill: Capillary refill takes less than 2 seconds.  Neurological:     General: No focal deficit present.     Mental Status: He is alert.      ED Treatments / Results  Labs (all labs ordered are listed, but only abnormal results are displayed) Labs Reviewed  CBC WITH DIFFERENTIAL/PLATELET - Abnormal; Notable for the following components:      Result Value   RBC 3.46 (*)    Hemoglobin 10.5 (*)    HCT 32.9 (*)    All other components within normal limits  COMPREHENSIVE METABOLIC PANEL - Abnormal; Notable for the following components:   Glucose, Bld 220 (*)    Calcium 8.1 (*)    Albumin 2.4 (*)    All other components within normal limits  C-REACTIVE PROTEIN - Abnormal; Notable for the following components:   CRP 21.6 (*)    All other components within normal limits  D-DIMER, QUANTITATIVE (NOT AT [facility]) - Abnormal; Notable for the following components:   D-Dimer, Quant 1.16 (*)    All other components within normal limits  FERRITIN - Abnormal; Notable for the following components:   Ferritin 449 (*)    All other components within normal limits  HEMOGLOBIN A1C - Abnormal; Notable for the following components:   Hgb A1c MFr Bld 8.1 (*)    All other components within normal limits  GLUCOSE, CAPILLARY - Abnormal; Notable for the following components:   Glucose-Capillary 112 (*)    All other components within  normal limits  GLUCOSE, CAPILLARY - Abnormal; Notable for the following components:   Glucose-Capillary 118 (*)    All other components within normal limits  GLUCOSE, CAPILLARY - Abnormal; Notable for the following components:   Glucose-Capillary 149 (*)    All other components within normal limits  GLUCOSE, CAPILLARY - Abnormal; Notable for the following components:   Glucose-Capillary 199 (*)    All other components within normal limits  MAGNESIUM  PHOSPHORUS  ABO/RH    EKG None  Radiology Ct Head Wo Contrast  Result Date: 06/03/2019 CLINICAL DATA:  Head trauma EXAM: CT  HEAD WITHOUT CONTRAST TECHNIQUE: Contiguous axial images were obtained from the base of the skull through the vertex without intravenous contrast. COMPARISON:  11/26/2013 FINDINGS: Brain: No evidence of acute infarction, hemorrhage, hydrocephalus, extra-axial collection or mass lesion/mass effect. Vascular: No hyperdense vessel or unexpected calcification. Skull: Normal. Negative for fracture or focal lesion. Sinuses/Orbits: No acute finding. Other: None. IMPRESSION: No acute intracranial pathology. Electronically Signed   By: Eddie Candle M.D.   On: 06/03/2019 14:41   Dg Chest Port 1 View  Result Date: 06/03/2019 CLINICAL DATA:  Shortness of breath EXAM: PORTABLE CHEST 1 VIEW COMPARISON:  06/01/2019 FINDINGS: The heart size and mediastinal contours are within normal limits. There is new, subtle heterogeneous airspace opacity of the bilateral lung bases, most conspicuous in the right lung base. The visualized skeletal structures are unremarkable. IMPRESSION: There is new, subtle heterogeneous airspace opacity of the bilateral lung bases, most conspicuous in the right lung base, concerning for infection. Electronically Signed   By: Eddie Candle M.D.   On: 06/03/2019 13:12   Dg Chest Port 1 View  Result Date: 06/02/2019 CLINICAL DATA:  History of COVID-19 positivity with shortness of breath and fevers EXAM: PORTABLE  CHEST 1 VIEW COMPARISON:  05/31/2019 FINDINGS: Cardiac shadow is stable. The previously seen right basilar opacity is again noted. No new focal infiltrate is seen. No bony abnormality is noted. IMPRESSION: Stable right basilar opacity. Electronically Signed   By: Inez Catalina M.D.   On: 06/02/2019 00:10    Procedures Procedures (including critical care time)  Medications Ordered in ED Medications  acetaminophen (TYLENOL) tablet 650 mg (has no administration in time range)     Initial Impression / Assessment and Plan / ED Course  I have reviewed the triage vital signs and the nursing notes.  Pertinent labs & imaging results that were available during my care of the patient were reviewed by me and considered in my medical decision making (see chart for details).        JERELL DEMERY was evaluated in Emergency Department on 06/03/2019 for the symptoms described in the history of present illness. He was evaluated in the context of the global COVID-19 pandemic, which necessitated consideration that the patient might be at risk for infection with the SARS-CoV-2 virus that causes COVID-19. Institutional protocols and algorithms that pertain to the evaluation of patients at risk for COVID-19 are in a state of rapid change based on information released by regulatory bodies including the CDC and federal and state organizations. These policies and algorithms were followed during the patient's care in the ED.   Final Clinical Impressions(s) / ED Diagnoses   Final diagnoses:  Community acquired pneumonia, unspecified laterality  COVID-19 virus infection  Contusion of left knee, initial encounter  Tachycardia    ED Discharge Orders    None       Hayden Rasmussen, MD 06/04/19 [phone]

## 2019-06-03 NOTE — ED Triage Notes (Signed)
Patient arrived via [facility] from home. Patient is AOx4 and ambulatory. Patient has had increased SOB due to being recently diagnosed with COVID. Patient was walking out to car and had syncopal  episode, hitting the posterior head. Patient is not on O2 at home however requires O2 currently. No fever and does have hx of bronchitis, pneumonia and COVID-19 diagnoses.

## 2019-06-03 NOTE — ED Notes (Signed)
unsuccessful IV attempts x2.

## 2019-06-03 NOTE — Consult Note (Signed)
TRH H&P    Patient Demographics:    Ethan Pierce, is a 73 y.o. male  MRN: [id number]  DOB - 1945-10-19  Admit Date - 06/03/2019  Referring MD/NP/PA: Dr. Charm Barges  Outpatient Primary MD for the patient is Pearson Grippe, MD  Patient coming from: Home  Chief complaint-fall   HPI:    Ethan Pierce  is a 73 y.o. male, with history of diabetes mellitus, GERD, hypertension who was recent diagnosed with Covid 19 infection.  He was seen at [facility] 3 days ago, and started on Levaquin.  At that time also patient refused admission to the hospital.  Today patient became dizzy while walking out of car and fell to the ground striking his head.  However patient denies loss of consciousness.  He denies shortness of breath.  Denies chest pain denies nausea vomiting or diarrhea.  In the ED he is not requiring oxygen, O2 sats 95% on room air. Patient tells me that he did stop taking his blood pressure medication for 4 days thinking it would interact with antibiotic.  In the ED chest x-ray shows new subtle heterogeneous airspace opacity of the bilateral lung bases most conspicuous in right lung base concerning for infection. TRH was called for admission.    Review of systems:    In addition to the HPI above,    All other systems reviewed and are negative.    Past History of the following :    Past Medical History:  Diagnosis Date  . Bronchitis   . Diabetes mellitus without complication (HCC)   . GERD (gastroesophageal reflux disease)   . Hypertension       History reviewed. No pertinent surgical history.    Social History:      Social History   Tobacco Use  . Smoking status: Never Smoker  . Smokeless tobacco: Never Used  Substance Use Topics  . Alcohol use: No       Family History :     Family History  Problem Relation Age of Onset  . Diabetes Mother   . GER disease Mother   . Cancer  Father        pancreas  . Diabetes Sister   . Diabetes Brother       Home Medications:   Prior to Admission medications   Medication Sig Start Date End Date Taking? Authorizing Provider  acetaminophen (TYLENOL) 160 MG/5ML elixir Take 15.6 mLs (500 mg total) by mouth every 4 (four) hours as needed for fever. 05/29/19  Yes Pfeiffer, Lebron Conners, MD  bimatoprost (LUMIGAN) 0.03 % ophthalmic solution Place 1 drop into both eyes at bedtime.   Yes [provider]  levofloxacin (LEVAQUIN) 750 MG tablet Take 1 tablet (750 mg total) by mouth daily. X 7 days 05/31/19  Yes Delo, Riley Lam, MD  lisinopril-hydrochlorothiazide (ZESTORETIC) 20-25 MG tablet Take 1 tablet by mouth daily.   Yes [provider]  OVER THE COUNTER MEDICATION Take 1 packet by mouth daily.   Yes [provider]  Polyethyl Glycol-Propyl Glycol (SYSTANE OP) Apply  1 drop to eye daily as needed (dry eyes).   Yes [provider]     Allergies:     Allergies  Allergen Reactions  . Penicillins Anaphylaxis    Did it involve swelling of the face/tongue/throat, SOB, or low BP? Yes Did it involve sudden or severe rash/hives, skin peeling, or any reaction on the inside of your mouth or nose? No Did you need to seek medical attention at a hospital or doctor's office? No When did it last happen? If all above answers are "NO", may proceed with cephalosporin use.   . Decongestant [Pseudoephedrine Hcl Er] Other (See Comments)    Elevated blood pressure   . Talwin [Pentazocine] Other (See Comments)    Elevated blood pressure.     Physical Exam:   Vitals  Blood pressure 134/65, pulse (!) 101, temperature 99.7 F (37.6 C), temperature source Oral, resp. rate (!) 22, height 5\' 8"  (1.727 m), weight 78 kg, SpO2 95 %.  1.  General: Appears in no acute distress  2. Psychiatric: Alert, oriented x3, intact insight and judgment  3. Neurologic: Cranial nerves II through XII grossly intact  4.  HEENMT:  Atraumatic normocephalic, extraocular muscles are intact  5. Respiratory : Clear to auscultation bilaterally  6. Cardiovascular : S1-S2, regular, no murmur auscultated  7. Gastrointestinal:  Abdomen is soft, nontender, no organomegaly     Data Review:    CBC Recent Labs  Lab 05/29/19 0804 05/31/19 2005 06/01/19 2330 06/03/19 1227  WBC 7.3 7.3 9.5 12.2*  HGB 12.4* 11.6* 11.8* 11.4*  HCT 37.8* 35.5* 35.3* 35.3*  PLT 165 175 204 284  MCV 94.0 92.4 92.7 93.9  MCH 30.8 30.2 31.0 30.3  MCHC 32.8 32.7 33.4 32.3  RDW 12.1 12.0 12.1 12.1  LYMPHSABS  --  0.8 0.5*  --   MONOABS  --  0.7 0.7  --   EOSABS  --  0.0 0.0  --   BASOSABS  --  0.0 0.0  --    ------------------------------------------------------------------------------------------------------------------  Results for orders placed or performed during the hospital encounter of 06/03/19 (from the past 48 hour(s))  Basic metabolic panel     Status: Abnormal   Collection Time: 06/03/19 12:27 PM  Result Value Ref Range   Sodium 135 135 - 145 mmol/L   Potassium 3.5 3.5 - 5.1 mmol/L   Chloride 98 98 - 111 mmol/L   CO2 25 22 - 32 mmol/L   Glucose, Bld 154 (H) 70 - 99 mg/dL   BUN 15 8 - 23 mg/dL   Creatinine, Ser 06/05/19 0.61 - 1.24 mg/dL   Calcium 8.2 (L) 8.9 - 10.3 mg/dL   GFR calc non Af Amer >60 >60 mL/min   GFR calc Af Amer >60 >60 mL/min   Anion gap 12 5 - 15    Comment: Performed at [facility], [address] [place], [place]  CBC     Status: Abnormal   Collection Time: 06/03/19 12:27 PM  Result Value Ref Range   WBC 12.2 (H) 4.0 - 10.5 K/uL   RBC 3.76 (L) 4.22 - 5.81 MIL/uL   Hemoglobin 11.4 (L) 13.0 - 17.0 g/dL   HCT 06/05/19 (L) 01.0 - 93.2 %   MCV 93.9 80.0 - 100.0 fL   MCH 30.3 26.0 - 34.0 pg   MCHC 32.3 30.0 - 36.0 g/dL   RDW 35.5 73.2 - 20.2 %   Platelets 284 150 - 400 K/uL   nRBC 0.0 0.0 -  0.2 %    Comment: Performed at [facility], [address], [place], [place] [address]  CBG monitoring, ED     Status: Abnormal   Collection Time: 06/03/19 12:30 PM  Result Value Ref Range   Glucose-Capillary 141 (H) 70 - 99 mg/dL  Lactic acid, plasma     Status: None   Collection Time: 06/03/19 12:30 PM  Result Value Ref Range   Lactic Acid, Venous 1.9 0.5 - 1.9 mmol/L    Comment: Performed at [facility], [address], [place], [place] [address]  D-dimer, quantitative     Status: Abnormal   Collection Time: 06/03/19 12:30 PM  Result Value Ref Range   D-Dimer, Quant 0.97 (H) 0.00 - 0.50 ug/mL-FEU    Comment: (NOTE) At the manufacturer cut-off of 0.50 ug/mL FEU, this assay has been documented to exclude PE with a sensitivity and negative predictive value of 97 to 99%.  At this time, this assay has not been approved by the FDA to exclude DVT/VTE. Results should be correlated with clinical presentation. Performed at [facility], [address] [place], [place] [address]   Procalcitonin     Status: None   Collection Time: 06/03/19 12:30 PM  Result Value Ref Range   Procalcitonin 0.24 ng/mL    Comment:        Interpretation: PCT (Procalcitonin) <= 0.5 ng/mL: Systemic infection (sepsis) is not likely. Local bacterial infection is possible. (NOTE)       Sepsis PCT Algorithm           Lower Respiratory Tract                                      Infection PCT Algorithm    ----------------------------     ----------------------------         PCT < 0.25 ng/mL                PCT < 0.10 ng/mL         Strongly encourage             Strongly discourage   discontinuation of antibiotics    initiation of antibiotics    ----------------------------     -----------------------------       PCT 0.25 - 0.50 ng/mL            PCT 0.10 - 0.25 ng/mL               OR       >80% decrease in PCT            Discourage initiation of                                            antibiotics      Encourage  discontinuation           of antibiotics    ----------------------------     -----------------------------         PCT >= 0.50 ng/mL              PCT 0.26 - 0.50 ng/mL               AND        <80% decrease in PCT  Encourage initiation of                                             antibiotics       Encourage continuation           of antibiotics    ----------------------------     -----------------------------        PCT >= 0.50 ng/mL                  PCT > 0.50 ng/mL               AND         increase in PCT                  Strongly encourage                                      initiation of antibiotics    Strongly encourage escalation           of antibiotics                                     -----------------------------                                           PCT <= 0.25 ng/mL                                                 OR                                        > 80% decrease in PCT                                     Discontinue / Do not initiate                                             antibiotics Performed at [facility] [address], [place], [place] [address]   Lactate dehydrogenase     Status: Abnormal   Collection Time: 06/03/19 12:30 PM  Result Value Ref Range   LDH 349 (H) 98 - 192 U/L    Comment: Performed at [facility], [place] [address], [place], [place] [address]  Ferritin     Status: Abnormal   Collection Time: 06/03/19 12:30 PM  Result Value Ref Range   Ferritin 442 (H) 24 - 336 ng/mL    Comment: Performed at [facility], [place] [address], [place], [place] [address]  Triglycerides     Status: None   Collection Time: 06/03/19 12:30 PM  Result Value Ref  Range   Triglycerides 18 <150 mg/dL    Comment: RESULTS CONFIRMED BY MANUAL DILUTION Performed at [facility], [address], [place], [place] [address]   Fibrinogen     Status: Abnormal   Collection  Time: 06/03/19 12:30 PM  Result Value Ref Range   Fibrinogen 730 (H) 210 - 475 mg/dL    Comment: Performed at [facility], [address], [place], [place] [address]  C-reactive protein     Status: Abnormal   Collection Time: 06/03/19 12:30 PM  Result Value Ref Range   CRP 21.7 (H) <1.0 mg/dL    Comment: Performed at [facility], [address] [place], [place] [address]    Chemistries  Recent Labs  Lab 05/29/19 0804 05/31/19 2005 06/01/19 2330 06/03/19 1227  NA 135 134* 136 135  K 4.2 3.2* 3.7 3.5  CL 97* 97* 101 98  CO2 GLUCOSE 167* 212* 197* 154*  BUN CREATININE 1.45* 1.29* 1.14 1.08  CALCIUM 8.1* 7.8* 8.3* 8.2*  AST  --  33  --   --   ALT  --  16  --   --   ALKPHOS  --  56  --   --   BILITOT  --  0.4  --   --    ------------------------------------------------------------------------------------------------------------------  ------------------------------------------------------------------------------------------------------------------ GFR: Estimated Creatinine Clearance: 58.9 mL/min (by C-G formula based on SCr of 1.08 mg/dL). Liver Function Tests: Recent Labs  Lab 05/31/19 2005  AST 33  ALT 16  ALKPHOS 56  BILITOT 0.4  PROT 7.4  ALBUMIN 3.0*   No results for input(s): LIPASE, AMYLASE in the last 168 hours. No results for input(s): AMMONIA in the last 168 hours. Coagulation Profile: Recent Labs  Lab 05/31/19 2005  INR 1.1   Cardiac Enzymes: No results for input(s): CKTOTAL, CKMB, CKMBINDEX, TROPONINI in the last 168 hours. BNP (last 3 results) No results for input(s): PROBNP in the last 8760 hours. HbA1C: No results for input(s): HGBA1C in the last 72 hours. CBG: Recent Labs  Lab 06/03/19 1230  GLUCAP 141*   Lipid Profile: Recent Labs    06/03/19 1230  TRIG 18   Thyroid Function Tests: No results for input(s): TSH, T4TOTAL, FREET4, T3FREE, THYROIDAB in the last 72  hours. Anemia Panel: Recent Labs    06/03/19 1230  FERRITIN 442*    --------------------------------------------------------------------------------------------------------------- Urine analysis:    Component Value Date/Time   COLORURINE YELLOW 09/09/2012 1601   APPEARANCEUR CLEAR 09/09/2012 1601   LABSPEC 1.029 09/09/2012 1601   PHURINE 5.5 09/09/2012 1601   GLUCOSEU NEGATIVE 09/09/2012 1601   HGBUR NEGATIVE 09/09/2012 1601   BILIRUBINUR NEGATIVE 09/09/2012 1601   KETONESUR NEGATIVE 09/09/2012 1601   PROTEINUR NEGATIVE 09/09/2012 1601   UROBILINOGEN 1.0 09/09/2012 1601   NITRITE NEGATIVE 09/09/2012 1601   LEUKOCYTESUR NEGATIVE 09/09/2012 1601      Imaging Results:    Ct Head Wo Contrast  Result Date: 06/03/2019 CLINICAL DATA:  Head trauma EXAM: CT HEAD WITHOUT CONTRAST TECHNIQUE: Contiguous axial images were obtained from the base of the skull through the vertex without intravenous contrast. COMPARISON:  11/26/2013 FINDINGS: Brain: No evidence of acute infarction, hemorrhage, hydrocephalus, extra-axial collection or mass lesion/mass effect. Vascular: No hyperdense vessel or unexpected calcification. Skull: Normal. Negative for fracture or focal lesion. Sinuses/Orbits: No acute finding. Other: None. IMPRESSION: No acute intracranial pathology. Electronically Signed   By: Lauralyn Primes M.D.   On:  06/03/2019 14:41   Dg Chest Port 1 View  Result Date: 06/03/2019 CLINICAL DATA:  Shortness of breath EXAM: PORTABLE CHEST 1 VIEW COMPARISON:  06/01/2019 FINDINGS: The heart size and mediastinal contours are within normal limits. There is new, subtle heterogeneous airspace opacity of the bilateral lung bases, most conspicuous in the right lung base. The visualized skeletal structures are unremarkable. IMPRESSION: There is new, subtle heterogeneous airspace opacity of the bilateral lung bases, most conspicuous in the right lung base, concerning for infection. Electronically Signed   By: Lauralyn PrimesAlex   Bibbey M.D.   On: 06/03/2019 13:12   Dg Chest Port 1 View  Result Date: 06/02/2019 CLINICAL DATA:  History of COVID-19 positivity with shortness of breath and fevers EXAM: PORTABLE CHEST 1 VIEW COMPARISON:  05/31/2019 FINDINGS: Cardiac shadow is stable. The previously seen right basilar opacity is again noted. No new focal infiltrate is seen. No bony abnormality is noted. IMPRESSION: Stable right basilar opacity. Electronically Signed   By: Alcide CleverMark  Lukens M.D.   On: 06/02/2019 00:10    My personal review of EKG: Rhythm NSR,    Assessment & Plan:    Active Problems:   * No active hospital problems. *   1. COVID-19 pneumonia-patient has elevated LDH 349, ferritin 442, CRP 21.7, lactic acid 1.9.  [facility]  Wants to be discharged home.  This was conveyed to the ED physician Dr. Charm BargesButler who also discussed with patient regarding admission to hospital.  Patient has refused admission and wants to be discharged home.      Meredeth IdeGagan S Rudi Bunyard M.D on 06/03/2019 at 6:28 PM

## 2019-06-03 NOTE — ED Provider Notes (Signed)
[facility]-EMERGENCY DEPT Provider Note   CSN: [id number] Arrival date & time: 06/03/19  1156     History   Chief Complaint Chief Complaint  Patient presents with  . Loss of Consciousness  . Fall  . COVID +    HPI Ethan Pierce is a 73 y.o. male.  He is brought in by EMS from home after a fall.  He is currently being treated for bronchitis and also was recently diagnosed with Covid.  He said he was walking out of the car and was moving too fast became woozy and fell to the ground striking his head.  He went back into the house and said he was feeling fine but EMS was called and found his saturations to be 86% improving with nasal cannula.  He says a little bit of posterior headache and mild shortness of breath.  He says the shortness of breath is resolved since he has been here.     The history is provided by the patient and the EMS personnel.  Fall This is a new problem. The current episode started 1 to 2 hours ago. The problem has been resolved. Associated symptoms include headaches and shortness of breath. Pertinent negatives include no chest pain and no abdominal pain. The symptoms are aggravated by exertion. The symptoms are relieved by rest. He has tried rest for the symptoms. The treatment provided moderate relief.    Past Medical History:  Diagnosis Date  . Bronchitis   . Diabetes mellitus without complication (HCC)   . GERD (gastroesophageal reflux disease)   . Hypertension     Patient Active Problem List   Diagnosis Date Noted  . Sepsis due to pneumonia (HCC) 05/31/2019  . Lumbar radiculopathy 03/03/2018  . HTN (hypertension) 08/08/2012  . GERD (gastroesophageal reflux disease) 08/08/2012    History reviewed. No pertinent surgical history.      Home Medications    Prior to Admission medications   Medication Sig Start Date End Date Taking? Authorizing Provider  acetaminophen (TYLENOL) 160 MG/5ML elixir Take 15.6 mLs (500 mg total)  by mouth every 4 (four) hours as needed for fever. 05/29/19   Arby Barrette, MD  atorvastatin (LIPITOR) 80 MG tablet Take 80 mg by mouth daily.    [provider]  BISACODYL PO Take by mouth.    [provider]  brompheniramine-pseudoephedrine-DM 30-2-10 MG/5ML syrup  09/20/18   [provider]  Calcipotriene 0.005 % solution  09/30/18   [provider]  Calcipotriene 0.005 % solution Apply to feet tid 10/12/18   Freddie Breech, DPM  chlorhexidine (PERIDEX) 0.12 % solution  06/06/18   [provider]  citalopram (CELEXA) 20 MG tablet Take 20 mg by mouth daily as needed (for anxiety).    [provider]  Cyanocobalamin (VITAMIN B 12 PO) Take by mouth.    [provider]  fluticasone (FLONASE) 50 MCG/ACT nasal spray Place into both nostrils daily.    [provider]  glimepiride (AMARYL) 4 MG tablet TAKE 1 TABLET BY MOUTH WITH BREAKFAST OR THE FIRST MAIN MEAL OF THE DAY ONCE A DAY (FOR DIABETES) 01/17/18   [provider]  levofloxacin (LEVAQUIN) 750 MG tablet Take 1 tablet (750 mg total) by mouth daily. X 7 days 05/31/19   Geoffery Lyons, MD  meloxicam (MOBIC) 15 MG tablet meloxicam 15 mg tablet    [provider]  methocarbamol (ROBAXIN) 500 MG tablet methocarbamol 500 mg tablet    [provider]  ofloxacin (OCUFLOX) 0.3 % ophthalmic solution ofloxacin 0.3 % eye drops    [provider]  PARoxetine (PAXIL) 20 MG tablet Take 40 mg by mouth daily.     [provider]  prednisoLONE acetate (PRED FORTE) 1 % ophthalmic suspension prednisolone acetate 1 % eye drops,suspension    [provider]  traZODone (DESYREL) 100 MG tablet Take 100 mg by mouth at bedtime as needed for sleep.    [provider]  TRIAMTERENE-HCTZ PO Take by mouth.    [provider]  urea (CARMOL) 20 % cream Apply topically at bedtime. For dry skin 05/03/18   Asencion IslamStover, Titory[place], DPM   venlafaxine XR (EFFEXOR-XR) 37.5 MG 24 hr capsule  12/01/18   [provider]    Family History Family History  Problem Relation Age of Onset  . Diabetes Mother   . GER disease Mother   . Cancer Father        pancreas  . Diabetes Sister   . Diabetes Brother     Social History Social History   Tobacco Use  . Smoking status: Never Smoker  . Smokeless tobacco: Never Used  Substance Use Topics  . Alcohol use: No  . Drug use: No     Allergies   Penicillins, Decongestant [pseudoephedrine hcl er], and Talwin [pentazocine]   Review of Systems Review of Systems  Constitutional: Negative for fever.  HENT: Negative for sore throat.   Eyes: Negative for visual disturbance.  Respiratory: Positive for shortness of breath. Negative for cough.   Cardiovascular: Negative for chest pain.  Gastrointestinal: Negative for abdominal pain.  Genitourinary: Negative for dysuria.  Musculoskeletal: Negative for neck pain.  Skin: Negative for rash.  Neurological: Positive for headaches.     Physical Exam Updated Vital Signs BP (!) 159/72 (BP Location: Left Arm)   Pulse (!) 111   Temp 99.7 F (37.6 C) (Oral)   Resp 19   Ht 5\' 8"  (1.727 m)   Wt 78 kg   SpO2 (S) 93%   BMI 26.15 kg/m   Physical Exam Vitals signs and nursing note reviewed.  Constitutional:      Appearance: He is well-developed.  HENT:     Head: Normocephalic and atraumatic.  Eyes:     Conjunctiva/sclera: Conjunctivae normal.  Neck:     Musculoskeletal: Neck supple.  Cardiovascular:     Rate and Rhythm: Normal rate and regular rhythm.     Heart sounds: No murmur.  Pulmonary:     Effort: Pulmonary effort is normal. No respiratory distress.     Breath sounds: Normal breath sounds.  Abdominal:     Palpations: Abdomen is soft.     Tenderness: There is no abdominal tenderness.  Musculoskeletal: Normal range of motion.     Right lower leg: No edema.     Left lower leg: No edema.  Skin:    General:  Skin is warm and dry.     Capillary Refill: Capillary refill takes less than 2 seconds.  Neurological:     General: No focal deficit present.     Mental Status: He is alert.      ED Treatments / Results  Labs (all labs ordered are listed, but only abnormal results are displayed) Labs Reviewed  BASIC METABOLIC PANEL - Abnormal; Notable for the following components:      Result Value   Glucose, Bld 154 (*)    Calcium 8.2 (*)    All other components within normal limits  CBC - Abnormal; Notable for the following components:   WBC 12.2 (*)    RBC 3.76 (*)    Hemoglobin 11.4 (*)    HCT 35.3 (*)    All other components within normal limits  D-DIMER, QUANTITATIVE (NOT AT [facility]) - Abnormal; Notable for the following components:   D-Dimer, Quant 0.97 (*)    All other components within normal limits  LACTATE DEHYDROGENASE - Abnormal; Notable for the following components:   LDH 349 (*)    All other components within normal limits  FERRITIN - Abnormal; Notable for the following components:   Ferritin 442 (*)    All other components within normal limits  FIBRINOGEN - Abnormal; Notable for the following components:   Fibrinogen 730 (*)    All other components within normal limits  C-REACTIVE PROTEIN - Abnormal; Notable for the following components:   CRP 21.7 (*)    All other components within normal limits  CBG MONITORING, ED - Abnormal; Notable for the following components:   Glucose-Capillary 141 (*)    All other components within normal limits  CULTURE, BLOOD (ROUTINE X 2)  CULTURE, BLOOD (ROUTINE X 2)  LACTIC ACID, PLASMA  PROCALCITONIN  TRIGLYCERIDES    EKG EKG Interpretation  Date/Time:  Saturday June 03 2019 13:02:46 EDT Ventricular Rate:  114 PR Interval:    QRS Duration: 93 QT Interval:  345 QTC Calculation: 476 R Axis:   63 Text Interpretation: Sinus tach Borderline prolonged QT interval similar to prior 10/20 Confirmed by Meridee Score [phone]) on  06/03/2019 1:15:17 PM   Radiology Ct Head Wo Contrast  Result Date: 06/03/2019 CLINICAL DATA:  Head trauma EXAM: CT HEAD WITHOUT CONTRAST TECHNIQUE: Contiguous axial images were obtained from the base of the skull through the vertex without intravenous contrast. COMPARISON:  11/26/2013 FINDINGS: Brain: No evidence of acute infarction, hemorrhage, hydrocephalus, extra-axial collection or mass lesion/mass effect. Vascular: No hyperdense vessel or unexpected calcification. Skull: Normal. Negative for fracture or focal lesion. Sinuses/Orbits: No acute finding. Other: None. IMPRESSION: No acute intracranial pathology. Electronically Signed   By: Lauralyn Primes M.D.   On: 06/03/2019 14:41   Dg Chest Port 1 View  Result Date: 06/03/2019 CLINICAL DATA:  Shortness of breath EXAM: PORTABLE CHEST 1 VIEW COMPARISON:  06/01/2019 FINDINGS: The heart size and mediastinal contours are within normal limits. There is new, subtle heterogeneous airspace opacity of the bilateral lung bases, most conspicuous in the right lung base. The visualized skeletal structures are unremarkable. IMPRESSION: There is new, subtle heterogeneous airspace opacity of the bilateral lung bases, most conspicuous in the right lung base, concerning for infection. Electronically Signed   By: Lauralyn Primes M.D.   On: 06/03/2019 13:12    Procedures .Critical Care Performed by: Terrilee Files, MD Authorized by: Terrilee Files, MD   Critical care provider statement:    Critical care time (minutes):  45   Critical care was necessary to treat or prevent imminent or life-threatening deterioration of the following conditions:  Renal failure   Critical care was time spent personally by me on the following activities:  Discussions with consultants, evaluation of patient's response to treatment, examination of patient, ordering and performing treatments and interventions, ordering and review of laboratory studies, ordering and review of  radiographic studies, pulse oximetry, re-evaluation of patient's condition, obtaining history from patient or surrogate, review of old charts and development of treatment plan with patient or surrogate   I assumed direction of critical care for this patient from  another provider in my specialty: no     (including critical care time)  Medications Ordered in ED Medications - No data to display   Initial Impression / Assessment and Plan / ED Course  I have reviewed the triage vital signs and the nursing notes.  Pertinent labs & imaging results that were available during my care of the patient were reviewed by me and considered in my medical decision making (see chart for details).  Clinical Course as of Jun 03 930  Sat Jun 02, 5630  1112 73 year old male Covid positive presyncope fall not on anticoagulation.  Differential includes CNS bleed, symptomatic Covid hypoxia, dehydration, pneumonia, metabolic derangement.  Was reportedly hypoxic at the scene although 93% on room air here.  Tachycardic.  Low-grade fever.  Appears to be breathing very comfortably here.  Getting labs CT head chest x-ray EKG.   [MB]  4034 Chest x-ray interpreted by me as probable bilateral lower lobe infiltrates.  This appears more prominent than on his x-ray from 3 days ago.   [MB]  7425 Patient CT interpreted by me as no acute bleed.  Awaiting radiology reading.   [MB]  9563 Patient remains tachycardic to the 120s and tachypneic despite some IV fluids.  This with the fact that he had a probable syncopal or at least presyncopal event when ambulating makes me concerned that he may have to be admitted to help to manage with his Covid symptoms.  I reviewed this with him and he is agreeable to plan.  Hospitalist has been paged.   [MB]  1536 Discussed with Dr. Darrick Meigs from the hospitalist service will evaluate the patient for admission.   [MB]  8756 Patient was seen by Dr. Darrick Meigs and was informed that he is going to be  transferred to [facility].  Patient is refusing to be transferred there and wishes to be discharged.  I went and try to explain how the patients are being cohort at there but he is demanding to be discharged and his wife is on his way to pick him up.  He understands he can return if he feels any worse.  His plan is to go to the [place] on Monday.   [MB]    Clinical Course User Index [MB] Hayden Rasmussen, MD   Ethan Pierce was evaluated in Emergency Department on 06/03/2019 for the symptoms described in the history of present illness. He was evaluated in the context of the global COVID-19 pandemic, which necessitated consideration that the patient might be at risk for infection with the SARS-CoV-2 virus that causes COVID-19. Institutional protocols and algorithms that pertain to the evaluation of patients at risk for COVID-19 are in a state of rapid change based on information released by regulatory bodies including the CDC and federal and state organizations. These policies and algorithms were followed during the patient's care in the ED.      Final Clinical Impressions(s) / ED Diagnoses   Final diagnoses:  COVID-19 virus infection  Community acquired pneumonia, unspecified laterality  Syncope and collapse    ED Discharge Orders    None       Hayden Rasmussen, MD 06/04/19 [phone]

## 2019-06-03 NOTE — ED Triage Notes (Signed)
Pt returns to ED after being told he will be transferred to [facility]. Pt reports SHOB and fever. Pt A/O x4.

## 2019-06-03 NOTE — Progress Notes (Signed)
Pharmacy Brief Note   O:  ALT: 10/28 = 16 CXR: pna SpO2: 95 on RA   A/P:  Patient meets requirements for remdesivir therapy Yes.  Will start  remdesivir 200 mg IV x 1  followed by 100 mg IV daily x 4 days.  Monitor ALT  Dorrene German 06/03/2019 9:34 PM

## 2019-06-03 NOTE — H&P (Signed)
History and Physical    Ethan Pierce VHQ:[id number] DOB: [birth year] DOA: 06/03/2019  PCP: Pearson Grippe, MD  Patient coming from: Home  I have personally briefly reviewed patient's old medical records in [facility]  Chief Complaint: Low oxygen, falls  HPI: Ethan Pierce is a 73 y.o. male with medical history significant for hypertension and type 2 diabetes who presents to the ED for evaluation of hypoxia in setting of recent COVID-19 positive test.  Patient has several recent ED visits for fevers, congestion/headache, and generalized weakness.  He had a SARS-CoV-2 test performed on 05/30/2019 which ultimately resulted positive.  Before the results came in he turned to: ED on 05/31/2019 as a code sepsis.  He was started on empiric Levaquin and was plan to be admitted to [facility] however he left AGAINST MEDICAL ADVICE per documentation.  Blood cultures drawn 05/31/2019 are no growth to date.  He returned to the ED 06/01/2019 at which time he was noted to be COVID-19 positive but was clinically stable and was discharged to home to continue his Levaquin and follow-up with the [facility] hospital.  He presented to the [facility] long ED 06/03/2019 with increased dyspnea on exertion.  He says he is walking out to his car and became lightheaded and fell.  He reports a minor head wound and scraping his left knee but denies any loss of consciousness.  Portable chest x-ray showed bilateral heterogeneous airspace opacities of the lung bases.  CT head was without any acute intracranial abnormality.  He was plan to be admitted to [facility] for further management of his COVID-19 however he declined admission to [facility] and requested to be discharged home, subsequently signed out AMA.  Per EDP, upon leaving the ED on his way to his car he had difficulty walking and fell injuring his left knee.  He says he spoke to a personal friend with house coverage at [facility] who stated they have a bed available for him there if he is to be admitted.  At time of my examination, patient reports feeling somewhat improved.  He says he has been having generalized weakness, dyspnea with exertion, cough productive of white sputum, and fevers.  He denies any chills or diaphoresis.  He denies any chest pain or palpitations.  Denies any abdominal pain, nausea, vomiting, diarrhea, constipation, dysuria, or swelling in his legs.  He reports an abrasion to his left knee after the previously mentioned follow-up without any further pain.  He again states that he is adamant about not being admitted to [facility] but is agreeable to be admitted here at [facility].  ED Course:  Vitals on return to ED showed BP 162/77, pulse 116, RR 26, temp 21.6 Fahrenheit, SPO2 93% on 2 L supplemental O2 via [place].  Earlier labs are notable for sodium 135, potassium 3.5, BUN 15, creatinine 1.08, serum glucose 154, bicarb 25, WBC 12.2, hemoglobin 11.4, platelets 284,000, lactic acid 1.9, D-dimer 0.97, procalcitonin 0.24, LDH 349, ferritin 442, fibrinogen 730, CRP 21.7.  Blood cultures were obtained and pending.  The hospitalist service was consulted to admit for further evaluation management.  Review of Systems: All systems reviewed and are negative except as documented in history of present illness above.   Past Medical History:  Diagnosis Date  . Bronchitis   . Diabetes mellitus without complication (HCC)   . GERD (gastroesophageal reflux disease)   . Hypertension  No past surgical history on file.  Social History:  reports that he has never smoked. He has never used smokeless tobacco. He reports that he does not drink alcohol or use drugs.  Allergies  Allergen Reactions  . Penicillins Anaphylaxis    Did it involve swelling of the face/tongue/throat, SOB, or low BP? Yes Did it involve sudden or severe rash/hives, skin peeling, or any reaction on the  inside of your mouth or nose? No Did you need to seek medical attention at a hospital or doctor's office? No When did it last happen? If all above answers are "NO", may proceed with cephalosporin use.   . Decongestant [Pseudoephedrine Hcl Er] Other (See Comments)    Elevated blood pressure   . Talwin [Pentazocine] Other (See Comments)    Elevated blood pressure.    Family History  Problem Relation Age of Onset  . Diabetes Mother   . GER disease Mother   . Cancer Father        pancreas  . Diabetes Sister   . Diabetes Brother      Prior to Admission medications   Medication Sig Start Date End Date Taking? Authorizing Provider  acetaminophen (TYLENOL) 160 MG/5ML elixir Take 15.6 mLs (500 mg total) by mouth every 4 (four) hours as needed for fever. 05/29/19   Charlesetta Shanks, MD  bimatoprost (LUMIGAN) 0.03 % ophthalmic solution Place 1 drop into both eyes at bedtime.    [provider]  levofloxacin (LEVAQUIN) 750 MG tablet Take 1 tablet (750 mg total) by mouth daily. X 7 days 05/31/19   Veryl Speak, MD  lisinopril-hydrochlorothiazide (ZESTORETIC) 20-25 MG tablet Take 1 tablet by mouth daily.    [provider]  OVER THE COUNTER MEDICATION Take 1 packet by mouth daily.    [provider]  Polyethyl Glycol-Propyl Glycol (SYSTANE OP) Apply 1 drop to eye daily as needed (dry eyes).    [provider]    Physical Exam: Vitals:   06/03/19 1950 06/03/19 1955 06/03/19 2000 06/03/19 2100  BP: (!) 162/71  (!) 162/77 (!) 150/68  Pulse: (!) 110  (!) 116 (!) 114  Resp: (!) 24  (!) 26 (!) 24  Temp:  (!) 101.6 F (38.7 C)    TempSrc:  Oral    SpO2:  94% 93% 97%    Constitutional: Resting in bed with head elevated, NAD, calm, comfortable Eyes: PERRL, EOMI, lids and conjunctivae normal ENMT: Mucous membranes are dry. Posterior pharynx clear of any exudate or lesions.Normal dentition.  Neck: normal, supple, no masses. Respiratory: clear to  auscultation bilaterally, no wheezing, no crackles using disposable stethoscope. Normal respiratory effort. No accessory muscle use.  Cardiovascular: Tachycardic with regular rhythm, no murmurs / rubs / gallops. No extremity edema. 2+ pedal pulses. Abdomen: no tenderness, no masses palpated. No hepatosplenomegaly. Bowel sounds positive.  Musculoskeletal: no clubbing / cyanosis. No joint deformity upper and lower extremities. Good ROM, no contractures. Normal muscle tone.  Skin: Abrasion to left knee without open wound or active bleeding. Neurologic: CN 2-12 grossly intact. Sensation intact, Strength 5/5 in all 4.  Psychiatric: Normal judgment and insight. Alert and oriented x 3. Normal mood.     Labs on Admission: I have personally reviewed following labs and imaging studies  CBC: Recent Labs  Lab 05/29/19 0804 05/31/19 2005 06/01/19 2330 06/03/19 1227  WBC 7.3 7.3 9.5 12.2*  NEUTROABS  --  5.8 8.3*  --   HGB 12.4* 11.6* 11.8* 11.4*  HCT 37.8* 35.5* 35.3*  35.3*  MCV 94.0 92.4 92.7 93.9  PLT 165 175 204 284   Basic Metabolic Panel: Recent Labs  Lab 05/29/19 0804 05/31/19 2005 06/01/19 2330 06/03/19 1227  NA 135 134* 136 135  K 4.2 3.2* 3.7 3.5  CL 97* 97* 101 98  CO2 GLUCOSE 167* 212* 197* 154*  BUN CREATININE 1.45* 1.29* 1.14 1.08  CALCIUM 8.1* 7.8* 8.3* 8.2*   GFR: Estimated Creatinine Clearance: 58.9 mL/min (by C-G formula based on SCr of 1.08 mg/dL). Liver Function Tests: Recent Labs  Lab 05/31/19 2005  AST 33  ALT 16  ALKPHOS 56  BILITOT 0.4  PROT 7.4  ALBUMIN 3.0*   No results for input(s): LIPASE, AMYLASE in the last 168 hours. No results for input(s): AMMONIA in the last 168 hours. Coagulation Profile: Recent Labs  Lab 05/31/19 2005  INR 1.1   Cardiac Enzymes: No results for input(s): CKTOTAL, CKMB, CKMBINDEX, TROPONINI in the last 168 hours. BNP (last 3 results) No results for input(s): PROBNP in the last 8760 hours.  HbA1C: No results for input(s): HGBA1C in the last 72 hours. CBG: Recent Labs  Lab 06/03/19 1230  GLUCAP 141*   Lipid Profile: Recent Labs    06/03/19 1230  TRIG 18   Thyroid Function Tests: No results for input(s): TSH, T4TOTAL, FREET4, T3FREE, THYROIDAB in the last 72 hours. Anemia Panel: Recent Labs    06/03/19 1230  FERRITIN 442*   Urine analysis:    Component Value Date/Time   COLORURINE YELLOW 09/09/2012 1601   APPEARANCEUR CLEAR 09/09/2012 1601   LABSPEC 1.029 09/09/2012 1601   PHURINE 5.5 09/09/2012 1601   GLUCOSEU NEGATIVE 09/09/2012 1601   HGBUR NEGATIVE 09/09/2012 1601   BILIRUBINUR NEGATIVE 09/09/2012 1601   KETONESUR NEGATIVE 09/09/2012 1601   PROTEINUR NEGATIVE 09/09/2012 1601   UROBILINOGEN 1.0 09/09/2012 1601   NITRITE NEGATIVE 09/09/2012 1601   LEUKOCYTESUR NEGATIVE 09/09/2012 1601    Radiological Exams on Admission: Ct Head Wo Contrast  Result Date: 06/03/2019 CLINICAL DATA:  Head trauma EXAM: CT HEAD WITHOUT CONTRAST TECHNIQUE: Contiguous axial images were obtained from the base of the skull through the vertex without intravenous contrast. COMPARISON:  11/26/2013 FINDINGS: Brain: No evidence of acute infarction, hemorrhage, hydrocephalus, extra-axial collection or mass lesion/mass effect. Vascular: No hyperdense vessel or unexpected calcification. Skull: Normal. Negative for fracture or focal lesion. Sinuses/Orbits: No acute finding. Other: None. IMPRESSION: No acute intracranial pathology. Electronically Signed   By: Lauralyn Primes M.D.   On: 06/03/2019 14:41   Dg Chest Port 1 View  Result Date: 06/03/2019 CLINICAL DATA:  Shortness of breath EXAM: PORTABLE CHEST 1 VIEW COMPARISON:  06/01/2019 FINDINGS: The heart size and mediastinal contours are within normal limits. There is new, subtle heterogeneous airspace opacity of the bilateral lung bases, most conspicuous in the right lung base. The visualized skeletal structures are unremarkable. IMPRESSION:  There is new, subtle heterogeneous airspace opacity of the bilateral lung bases, most conspicuous in the right lung base, concerning for infection. Electronically Signed   By: Lauralyn Primes M.D.   On: 06/03/2019 13:12   Dg Chest Port 1 View  Result Date: 06/02/2019 CLINICAL DATA:  History of COVID-19 positivity with shortness of breath and fevers EXAM: PORTABLE CHEST 1 VIEW COMPARISON:  05/31/2019 FINDINGS: Cardiac shadow is stable. The previously seen right basilar opacity is again noted. No new focal infiltrate is seen. No bony abnormality is noted. IMPRESSION: Stable right basilar opacity. Electronically Signed  By: Alcide CleverMark  Lukens M.D.   On: 06/02/2019 00:10    EKG: Independently reviewed. Sinus tachycardia, rate 114, QTc 476.  When compared to prior heart rate is slightly faster.  Assessment/Plan Principal Problem:   Pneumonia due to COVID-19 virus Active Problems:   Hypertension associated with diabetes (HCC)   Type 2 diabetes mellitus (HCC)   Fall   Acute respiratory failure with hypoxia (HCC)  Ethan Pierce is a 73 y.o. male with medical history significant for hypertension and type 2 diabetes who is admitted with acute respiratory failure with hypoxia due to COVID-19 pneumonia.  Acute respiratory failure with hypoxia due to COVID-19 pneumonia: SARS-CoV-2 test positive on 05/30/2019.  Chest x-ray with bilateral opacities at the lung bases.  O2 saturation was 93% on 2 L supplemental O2 via [place].  CRP elevated to 21, other inflammatory markers mildly elevated as well.  Procalcitonin 0.24.  Patient adamantly refused [facility].  [facility] regardless, [facility]. -Start remdesivir per pharmacy, check hepatic function panel -Start IV Solu-Medrol 0.5 mg/kg twice daily -Continue supplemental oxygen and wean off as able -Continue albuterol inhaler flutter valve, incentive spirometer -Continue  vitamin C, zinc, antitussives, as needed Tylenol -Will hold further antibiotics  Falls: Likely from dehydration and generalized weakness secondary to COVID-19 infection.  He does appear hypovolemic on admission.  Reports a minor head injury, CT head is negative for acute intracranial abnormality. -Start IV fluid hydration overnight -Check orthostatic vital signs -PT eval  Hypertension: Will resume home lisinopril-HCTZ tomorrow after overnight IV hydration.  Type 2 diabetes: Currently diet controlled as an outpatient.  Check A1c.  Will start sensitive SSI in anticipation of steroid associated hyperglycemia.  DVT prophylaxis: Lovenox Code Status: Full code, confirmed with patient Family Communication: Discussed with patient, he states his wife is updated Disposition Plan: Pending clinical progress Consults called: None Admission status: Inpatient for management of acute respiratory failure with hypoxia due to COVID-19 pneumonia, anticipate at least 5-day stay for IV remdesivir treatment in addition to steroids.   Darreld McleanVishal Clydell Sposito MD [facility]  If 7PM-7AM, please contact night-coverage [email or web address]  06/03/2019, 9:41 PM

## 2019-06-03 NOTE — Discharge Instructions (Addendum)
You were seen in the hospital for evaluation after a fainting spell in which she struck her head.  You had lab work EKG chest x-ray and a CAT scan of your head.  There is still signs of pneumonia on your chest x-ray.  Please continue to finish her antibiotics.  You were also Covid positive so you should isolate and please return if any worsening shortness of breath.  You were offered admission and you declined being admitted to the hospital.

## 2019-06-04 ENCOUNTER — Encounter (HOSPITAL_COMMUNITY): Payer: Self-pay | Admitting: *Deleted

## 2019-06-04 DIAGNOSIS — W19XXXD Unspecified fall, subsequent encounter: Secondary | ICD-10-CM

## 2019-06-04 DIAGNOSIS — J9601 Acute respiratory failure with hypoxia: Secondary | ICD-10-CM

## 2019-06-04 DIAGNOSIS — E1159 Type 2 diabetes mellitus with other circulatory complications: Secondary | ICD-10-CM

## 2019-06-04 DIAGNOSIS — I10 Essential (primary) hypertension: Secondary | ICD-10-CM

## 2019-06-04 LAB — CBC WITH DIFFERENTIAL/PLATELET
Abs Immature Granulocytes: 0.07 10*3/uL (ref 0.00–0.07)
Basophils Absolute: 0 10*3/uL (ref 0.0–0.1)
Basophils Relative: 0 %
Eosinophils Absolute: 0 10*3/uL (ref 0.0–0.5)
Eosinophils Relative: 0 %
HCT: 32.9 % — ABNORMAL LOW (ref 39.0–52.0)
Hemoglobin: 10.5 g/dL — ABNORMAL LOW (ref 13.0–17.0)
Immature Granulocytes: 1 %
Lymphocytes Relative: 8 %
Lymphs Abs: 0.7 10*3/uL (ref 0.7–4.0)
MCH: 30.3 pg (ref 26.0–34.0)
MCHC: 31.9 g/dL (ref 30.0–36.0)
MCV: 95.1 fL (ref 80.0–100.0)
Monocytes Absolute: 0.2 10*3/uL (ref 0.1–1.0)
Monocytes Relative: 3 %
Neutro Abs: 7.3 10*3/uL (ref 1.7–7.7)
Neutrophils Relative %: 88 %
Platelets: 304 10*3/uL (ref 150–400)
RBC: 3.46 MIL/uL — ABNORMAL LOW (ref 4.22–5.81)
RDW: 12.4 % (ref 11.5–15.5)
WBC: 8.3 10*3/uL (ref 4.0–10.5)
nRBC: 0 % (ref 0.0–0.2)

## 2019-06-04 LAB — COMPREHENSIVE METABOLIC PANEL
ALT: 19 U/L (ref 0–44)
AST: 33 U/L (ref 15–41)
Albumin: 2.4 g/dL — ABNORMAL LOW (ref 3.5–5.0)
Alkaline Phosphatase: 50 U/L (ref 38–126)
Anion gap: 9 (ref 5–15)
BUN: 15 mg/dL (ref 8–23)
CO2: 26 mmol/L (ref 22–32)
Calcium: 8.1 mg/dL — ABNORMAL LOW (ref 8.9–10.3)
Chloride: 104 mmol/L (ref 98–111)
Creatinine, Ser: 0.95 mg/dL (ref 0.61–1.24)
GFR calc Af Amer: >60 mL/min (ref >60)
GFR calc non Af Amer: >60 mL/min (ref >60)
Glucose, Bld: 220 mg/dL — ABNORMAL HIGH (ref 70–99)
Potassium: 4.3 mmol/L (ref 3.5–5.1)
Sodium: 139 mmol/L (ref 135–145)
Total Bilirubin: 0.5 mg/dL (ref 0.3–1.2)
Total Protein: 6.9 g/dL (ref 6.5–8.1)

## 2019-06-04 LAB — GLUCOSE, CAPILLARY
Glucose-Capillary: 112 mg/dL — ABNORMAL HIGH (ref 70–99)
Glucose-Capillary: 118 mg/dL — ABNORMAL HIGH (ref 70–99)
Glucose-Capillary: 139 mg/dL — ABNORMAL HIGH (ref 70–99)
Glucose-Capillary: 149 mg/dL — ABNORMAL HIGH (ref 70–99)
Glucose-Capillary: 193 mg/dL — ABNORMAL HIGH (ref 70–99)
Glucose-Capillary: 199 mg/dL — ABNORMAL HIGH (ref 70–99)

## 2019-06-04 LAB — HEMOGLOBIN A1C
Hgb A1c MFr Bld: 8.1 % — ABNORMAL HIGH (ref 4.8–5.6)
Mean Plasma Glucose: 185.77 mg/dL

## 2019-06-04 LAB — FERRITIN: Ferritin: 449 ng/mL — ABNORMAL HIGH (ref 24–336)

## 2019-06-04 LAB — PHOSPHORUS: Phosphorus: 3.1 mg/dL (ref 2.5–4.6)

## 2019-06-04 LAB — C-REACTIVE PROTEIN: CRP: 21.6 mg/dL — ABNORMAL HIGH (ref <1.0)

## 2019-06-04 LAB — MAGNESIUM: Magnesium: 2.2 mg/dL (ref 1.7–2.4)

## 2019-06-04 LAB — D-DIMER, QUANTITATIVE: D-Dimer, Quant: 1.16 ug/mL-FEU — ABNORMAL HIGH (ref 0.00–0.50)

## 2019-06-04 MED ORDER — METHYLPREDNISOLONE SODIUM SUCC 125 MG IJ SOLR
60.0000 mg | Freq: Two times a day (BID) | INTRAMUSCULAR | Status: DC
  Administered 2019-06-04: 21:00:00 60 mg via INTRAVENOUS
  Administered 2019-06-04: 20 mg via INTRAVENOUS
  Administered 2019-06-05: 60 mg via INTRAVENOUS
  Filled 2019-06-04 (×3): qty 2

## 2019-06-04 MED ORDER — SODIUM CHLORIDE 0.9 % IV SOLN
100.0000 mg | Freq: Once | INTRAVENOUS | Status: AC
  Administered 2019-06-04: 20:00:00 100 mg via INTRAVENOUS
  Filled 2019-06-04: qty 20

## 2019-06-04 MED ORDER — SODIUM CHLORIDE 0.9 % IV SOLN
100.0000 mg | INTRAVENOUS | Status: AC
  Administered 2019-06-05 – 2019-06-07 (×3): 100 mg via INTRAVENOUS
  Filled 2019-06-04 (×4): qty 20

## 2019-06-04 NOTE — Progress Notes (Signed)
   06/04/19 1600  Patient Belongings  Patient/Family advised about valuables policy? Yes  Belongings at Bedside Dentures;Glasses;Jewelry;Clothing;Cell phone;Other (Comment) (cell phone charger, black bag, veteran hat)  Dentures Upper;Lower;Partial(s) (lower partial, full upper)  Jewelry Ring

## 2019-06-04 NOTE — Progress Notes (Signed)
Report called to [facility] at [facility], awaiting [facility] for transport.

## 2019-06-04 NOTE — Progress Notes (Signed)
PROGRESS NOTE    Ethan Pierce  ZOX:[id number] DOB: [birth year] DOA: 06/03/2019 PCP: Pearson Grippe, MD   Brief Narrative:  HPI per Dr. Melina Fiddler is a 73 y.o. male with medical history significant for hypertension and type 2 diabetes who presents to the ED for evaluation of hypoxia in setting of recent COVID-19 positive test.  Patient has several recent ED visits for fevers, congestion/headache, and generalized weakness.  He had a SARS-CoV-2 test performed on 05/30/2019 which ultimately resulted positive.  Before the results came in he turned to: ED on 05/31/2019 as a code sepsis.  He was started on empiric Levaquin and was plan to be admitted to [facility] however he left AGAINST MEDICAL ADVICE per documentation.  Blood cultures drawn 05/31/2019 are no growth to date.  He returned to the ED 06/01/2019 at which time he was noted to be COVID-19 positive but was clinically stable and was discharged to home to continue his Levaquin and follow-up with the [facility] hospital.  He presented to the [facility] long ED 06/03/2019 with increased dyspnea on exertion.  He says he is walking out to his car and became lightheaded and fell.  He reports a minor head wound and scraping his left knee but denies any loss of consciousness.  Portable chest x-ray showed bilateral heterogeneous airspace opacities of the lung bases.  CT head was without any acute intracranial abnormality.  He was plan to be admitted to [facility] for further management of his COVID-19 however he declined admission to [facility] and requested to be discharged home, subsequently signed out AMA.  Per EDP, upon leaving the ED on his way to his car he had difficulty walking and fell injuring his left knee.  He says he spoke to a personal friend with house coverage at [facility] who stated they have a bed available for him there if he is to be admitted.  At time of my examination, patient  reports feeling somewhat improved.  He says he has been having generalized weakness, dyspnea with exertion, cough productive of white sputum, and fevers.  He denies any chills or diaphoresis.  He denies any chest pain or palpitations.  Denies any abdominal pain, nausea, vomiting, diarrhea, constipation, dysuria, or swelling in his legs.  He reports an abrasion to his left knee after the previously mentioned follow-up without any further pain.  He again states that he is adamant about not being admitted to [facility] but is agreeable to be admitted here at [facility].  ED Course:  Vitals on return to ED showed BP 162/77, pulse 116, RR 26, temp 21.6 Fahrenheit, SPO2 93% on 2 L supplemental O2 via [place].  Earlier labs are notable for sodium 135, potassium 3.5, BUN 15, creatinine 1.08, serum glucose 154, bicarb 25, WBC 12.2, hemoglobin 11.4, platelets 284,000, lactic acid 1.9, D-dimer 0.97, procalcitonin 0.24, LDH 349, ferritin 442, fibrinogen 730, CRP 21.7.  Blood cultures were obtained and pending.  The hospitalist service was consulted to admit for further evaluation management.  Assessment & Plan:   Principal Problem:   Pneumonia due to COVID-19 virus Active Problems:   Hypertension associated with diabetes (HCC)   Type 2 diabetes mellitus (HCC)   Fall   Acute respiratory failure with hypoxia (HCC)  #1 acute respiratory failure with hypoxia due to COVID-19 pneumonia Patient noted to have a SARS-CoV-2 test positive on 05/30/2019.  Chest x-ray done did show bilateral opacities in  the lung bases.  O2 sats of 93% on 2 L supplemental O2.  CRP was elevated at 21.  Procalcitonin 0.24.  On admission patient adamantly refused admission to [facility].  Patient stated had a bad feeling about [facility].  Patient started on IV Solu-Medrol and IV remdesivir.  Increase Solu-Medrol to 60 mg IV twice daily.  Continue vitamin C, zinc, antitussives, albuterol MDI, as  needed Tylenol.  Patient slowly improving clinically.  Patient now with sats of 96% on room air.  Spoke with patient and explained the role of [facility] and patient is now in agreement to transfer to [facility].  2.  Falls Likely secondary to dehydration and generalized weakness secondary to COVID-19 infection.  Patient noted to be hypovolemic on admission.  Head CT which was done was negative.  Continue IV fluids.  PT/OT.  Follow-up.  3.  Hypertension Patient resumed back on home regimen of lisinopril HCTZ.  4.  Diabetes mellitus type 2 Diet controlled.  CBG of 149 this morning.  Hemoglobin A1c 8.1.  Continue sliding scale insulin.   DVT prophylaxis: Lovenox Code Status: Full Family Communication: Updated patient.  No family at bedside. Disposition Plan: Transfer to [facility].   Consultants:   None  Procedures:   CT head 06/03/2019  Chest x-ray 06/03/2019  Antimicrobials:  None   Subjective: Patient states feeling better.  Patient currently on room air.  Patient denies any chest pain.  Patient initially adamant on not going to [facility] however now in agreement to be transferred there.  Objective: Vitals:   06/03/19 2300 06/03/19 2335 06/03/19 2355 06/04/19 0500  BP: (!) 150/79 (!) 159/73  (!) 158/76  Pulse: (!) 109 (!) 108  (!) 101  Resp: (!) 25 18  18   Temp:  99.8 F (37.7 C)  98.6 F (37 C)  TempSrc:  Oral  Oral  SpO2: 98% 100%  96%  Weight:   78 kg   Height:   5\' 8"  (1.727 m)     Intake/Output Summary (Last 24 hours) at 06/04/2019 1011 Last data filed at 06/04/2019 0932 Gross per 24 hour  Intake 878.39 ml  Output 825 ml  Net 53.39 ml   Filed Weights   06/03/19 2355  Weight: 78 kg    Examination:  General exam: Appears calm and comfortable Respiratory system: Lungs clear to auscultation bilaterally.  No wheezes, no crackles, no rhonchi.  Normal respiratory effort.  Speaking in full sentences.  Cardiovascular  system: S1 & S2 heard, RRR. No JVD, murmurs, rubs, gallops or clicks. No pedal edema. Gastrointestinal system: Abdomen is nondistended, soft and nontender. No organomegaly or masses felt. Normal bowel sounds heard. Central nervous system: Alert and oriented. No focal neurological deficits. Extremities: Symmetric 5 x 5 power. Skin: No rashes, lesions or ulcers Psychiatry: Judgement and insight appear normal. Mood & affect appropriate.     Data Reviewed: I have personally reviewed following labs and imaging studies  CBC: Recent Labs  Lab 05/29/19 0804 05/31/19 2005 06/01/19 2330 06/03/19 1227 06/04/19 0405  WBC 7.3 7.3 9.5 12.2* 8.3  NEUTROABS  --  5.8 8.3*  --  7.3  HGB 12.4* 11.6* 11.8* 11.4* 10.5*  HCT 37.8* 35.5* 35.3* 35.3* 32.9*  MCV 94.0 92.4 92.7 93.9 95.1  PLT 165 175 204 284 509   Basic Metabolic Panel: Recent Labs  Lab 05/29/19 0804 05/31/19 2005 06/01/19 2330 06/03/19 1227 06/04/19 0405  NA 135 134* 136 135 139  K 4.2 3.2* 3.7 3.5 4.3  CL 97* 97* 101 98 104  CO2 26 23 24 25 26   GLUCOSE 167* 212* 197* 154* 220*  BUN 15 22 16 15 15   CREATININE 1.45* 1.29* 1.14 1.08 0.95  CALCIUM 8.1* 7.8* 8.3* 8.2* 8.1*  MG  --   --   --   --  2.2  PHOS  --   --   --   --  3.1   GFR: Estimated Creatinine Clearance: 67 mL/min (by C-G formula based on SCr of 0.95 mg/dL). Liver Function Tests: Recent Labs  Lab 05/31/19 2005 06/04/19 0405  AST 33 33  ALT 16 19  ALKPHOS 56 50  BILITOT 0.4 0.5  PROT 7.4 6.9  ALBUMIN 3.0* 2.4*   No results for input(s): LIPASE, AMYLASE in the last 168 hours. No results for input(s): AMMONIA in the last 168 hours. Coagulation Profile: Recent Labs  Lab 05/31/19 2005  INR 1.1   Cardiac Enzymes: No results for input(s): CKTOTAL, CKMB, CKMBINDEX, TROPONINI in the last 168 hours. BNP (last 3 results) No results for input(s): PROBNP in the last 8760 hours. HbA1C: Recent Labs    06/04/19 0405  HGBA1C 8.1*   CBG: Recent Labs  Lab  06/03/19 1230 06/03/19 2318 06/04/19 0006 06/04/19 0105 06/04/19 0835  GLUCAP 141* 118* 112* 149* 199*   Lipid Profile: Recent Labs    06/03/19 1230  TRIG 18   Thyroid Function Tests: No results for input(s): TSH, T4TOTAL, FREET4, T3FREE, THYROIDAB in the last 72 hours. Anemia Panel: Recent Labs    06/03/19 1230 06/04/19 0405  FERRITIN 442* 449*   Sepsis Labs: Recent Labs  Lab 05/29/19 0814 05/31/19 2005 06/01/19 2330 06/03/19 1230  PROCALCITON  --   --  0.18 0.24  LATICACIDVEN 1.0 1.5  --  1.9    Recent Results (from the past 240 hour(s))  Novel Coronavirus, NAA ([facility])     Status: Abnormal   Collection Time: 05/30/19 12:10 PM   Specimen: Nasopharyngeal(NP) swabs in vial transport medium   NASOPHARYNGE  TESTING  Result Value Ref Range Status   SARS-CoV-2, NAA Detected (A) Not Detected Final    Comment: This nucleic acid amplification test was developed and its performance characteristics determined by [facility]. Nucleic acid amplification tests include PCR and TMA. This test has not been FDA cleared or approved. This test has been authorized by FDA under an Emergency Use Authorization (EUA). This test is only authorized for the duration of time the declaration that circumstances exist justifying the authorization of the emergency use of in vitro diagnostic tests for detection of SARS-CoV-2 virus and/or diagnosis of COVID-19 infection under section 564(b)(1) of the Act, 21 U.S.C. 161WRU-0(A360bbb-3(b) (1), unless the authorization is terminated or revoked sooner. When diagnostic testing is negative, the possibility of a false negative result should be considered in the context of a patient's recent exposures and the presence of clinical signs and symptoms consistent with COVID-19. An individual without symptoms of COVID-19 and who is not shedding SARS-CoV-2 virus would  expect to have a negative (not detected) result in this assay.   Blood Culture  (routine x 2)     Status: None (Preliminary result)   Collection Time: 05/31/19  7:45 PM   Specimen: BLOOD  Result Value Ref Range Status   Specimen Description   Final    BLOOD RIGHT ANTECUBITAL Performed at [facility] [place], [address] [place], [place] [id number]    Special Requests  Final    BOTTLES DRAWN AEROBIC AND ANAEROBIC Blood Culture adequate volume Performed at [facility], [address] [place], [place] [address]    Culture   Final    NO GROWTH 4 DAYS Performed at [facility], [address] [place], [place] [address]    Report Status PENDING  Incomplete  Blood Culture (routine x 2)     Status: None (Preliminary result)   Collection Time: 05/31/19  8:00 PM   Specimen: BLOOD RIGHT FOREARM  Result Value Ref Range Status   Specimen Description   Final    BLOOD RIGHT FOREARM Performed at [facility], [address], [place], [place] [address]    Special Requests   Final    BOTTLES DRAWN AEROBIC AND ANAEROBIC Blood Culture adequate volume Performed at [facility], [address] [place], [place] [address]    Culture   Final    NO GROWTH 4 DAYS Performed at [facility], [address] [place], [place] [address]    Report Status PENDING  Incomplete  Blood Culture (routine x 2)     Status: None (Preliminary result)   Collection Time: 06/03/19 12:30 PM   Specimen: BLOOD  Result Value Ref Range Status   Specimen Description   Final    BLOOD LEFT ANTECUBITAL Performed at [facility], [address], [place], [place] [address]    Special Requests   Final    BOTTLES DRAWN AEROBIC AND ANAEROBIC Blood Culture adequate volume Performed at [facility], [address] [place], [place] [address]    Culture   Final    NO GROWTH < 24 HOURS Performed at [facility], [address] [place], [place] [address]    Report Status PENDING  Incomplete  Blood Culture (routine  x 2)     Status: None (Preliminary result)   Collection Time: 06/03/19 12:35 PM   Specimen: BLOOD LEFT FOREARM  Result Value Ref Range Status   Specimen Description   Final    BLOOD LEFT FOREARM Performed at [facility], [address], [place], [place] [address]    Special Requests   Final    BOTTLES DRAWN AEROBIC AND ANAEROBIC Blood Culture results may not be optimal due to an excessive volume of blood received in culture bottles Performed at [facility], [address] [place], [place] [address]    Culture   Final    NO GROWTH < 24 HOURS Performed at [facility], [address] [place], [place] [address]    Report Status PENDING  Incomplete         Radiology Studies: Ct Head Wo Contrast  Result Date: 06/03/2019 CLINICAL DATA:  Head trauma EXAM: CT HEAD WITHOUT CONTRAST TECHNIQUE: Contiguous axial images were obtained from the base of the skull through the vertex without intravenous contrast. COMPARISON:  11/26/2013 FINDINGS: Brain: No evidence of acute infarction, hemorrhage, hydrocephalus, extra-axial collection or mass lesion/mass effect. Vascular: No hyperdense vessel or unexpected calcification. Skull: Normal. Negative for fracture or focal lesion. Sinuses/Orbits: No acute finding. Other: None. IMPRESSION: No acute intracranial pathology. Electronically Signed   By: Lauralyn Primes M.D.   On: 06/03/2019 14:41   Dg Chest Port 1 View  Result Date: 06/03/2019 CLINICAL DATA:  Shortness of breath EXAM: PORTABLE CHEST 1 VIEW COMPARISON:  06/01/2019 FINDINGS: The heart size and mediastinal contours are within normal limits.  There is new, subtle heterogeneous airspace opacity of the bilateral lung bases, most conspicuous in the right lung base. The visualized skeletal structures are unremarkable. IMPRESSION: There is new, subtle heterogeneous airspace opacity of the bilateral lung bases, most conspicuous in the right lung base, concerning  for infection. Electronically Signed   By: Lauralyn Primes M.D.   On: 06/03/2019 13:12        Scheduled Meds: . albuterol  2 puff Inhalation Q6H  . enoxaparin (LOVENOX) injection  40 mg Subcutaneous QHS  . lisinopril  20 mg Oral Daily   And  . hydrochlorothiazide  25 mg Oral Daily  . insulin aspart  0-9 Units Subcutaneous TID WC  . latanoprost  1 drop Both Eyes QHS  . methylPREDNISolone (SOLU-MEDROL) injection  60 mg Intravenous Q12H  . vitamin C  500 mg Oral Daily  . zinc sulfate  220 mg Oral Daily   Continuous Infusions: . remdesivir 100 mg in NS 250 mL       LOS: 1 day    Time spent: 35 minutes    [name], MD [facility]  If 7PM-7AM, please contact night-coverage [email or web address] 06/04/2019, 10:11 AM

## 2019-06-04 NOTE — Progress Notes (Signed)
PT demonstrated hands on and verbal understanding of Flutter device- PT has strong, productive cough at this time.

## 2019-06-05 DIAGNOSIS — E1165 Type 2 diabetes mellitus with hyperglycemia: Secondary | ICD-10-CM | POA: Diagnosis present

## 2019-06-05 DIAGNOSIS — I10 Essential (primary) hypertension: Secondary | ICD-10-CM

## 2019-06-05 DIAGNOSIS — IMO0002 Reserved for concepts with insufficient information to code with codable children: Secondary | ICD-10-CM | POA: Diagnosis present

## 2019-06-05 DIAGNOSIS — W19XXXA Unspecified fall, initial encounter: Secondary | ICD-10-CM

## 2019-06-05 DIAGNOSIS — E118 Type 2 diabetes mellitus with unspecified complications: Secondary | ICD-10-CM

## 2019-06-05 HISTORY — DX: Essential (primary) hypertension: I10

## 2019-06-05 LAB — COMPREHENSIVE METABOLIC PANEL
ALT: 29 U/L (ref 0–44)
AST: 51 U/L — ABNORMAL HIGH (ref 15–41)
Albumin: 2.9 g/dL — ABNORMAL LOW (ref 3.5–5.0)
Alkaline Phosphatase: 64 U/L (ref 38–126)
Anion gap: 16 — ABNORMAL HIGH (ref 5–15)
BUN: 25 mg/dL — ABNORMAL HIGH (ref 8–23)
CO2: 21 mmol/L — ABNORMAL LOW (ref 22–32)
Calcium: 8.7 mg/dL — ABNORMAL LOW (ref 8.9–10.3)
Chloride: 103 mmol/L (ref 98–111)
Creatinine, Ser: 1.13 mg/dL (ref 0.61–1.24)
GFR calc Af Amer: >60 mL/min (ref >60)
GFR calc non Af Amer: >60 mL/min (ref >60)
Glucose, Bld: 232 mg/dL — ABNORMAL HIGH (ref 70–99)
Potassium: 3.4 mmol/L — ABNORMAL LOW (ref 3.5–5.1)
Sodium: 140 mmol/L (ref 135–145)
Total Bilirubin: 0.7 mg/dL (ref 0.3–1.2)
Total Protein: 7.6 g/dL (ref 6.5–8.1)

## 2019-06-05 LAB — CULTURE, BLOOD (ROUTINE X 2)
Culture: NO GROWTH
Culture: NO GROWTH
Special Requests: ADEQUATE
Special Requests: ADEQUATE

## 2019-06-05 LAB — CBC WITH DIFFERENTIAL/PLATELET
Abs Immature Granulocytes: 0.23 10*3/uL — ABNORMAL HIGH (ref 0.00–0.07)
Basophils Absolute: 0 10*3/uL (ref 0.0–0.1)
Basophils Relative: 0 %
Eosinophils Absolute: 0 10*3/uL (ref 0.0–0.5)
Eosinophils Relative: 0 %
HCT: 36.5 % — ABNORMAL LOW (ref 39.0–52.0)
Hemoglobin: 12 g/dL — ABNORMAL LOW (ref 13.0–17.0)
Immature Granulocytes: 1 %
Lymphocytes Relative: 6 %
Lymphs Abs: 1.4 10*3/uL (ref 0.7–4.0)
MCH: 30.5 pg (ref 26.0–34.0)
MCHC: 32.9 g/dL (ref 30.0–36.0)
MCV: 92.6 fL (ref 80.0–100.0)
Monocytes Absolute: 1 10*3/uL (ref 0.1–1.0)
Monocytes Relative: 5 %
Neutro Abs: 19.3 10*3/uL — ABNORMAL HIGH (ref 1.7–7.7)
Neutrophils Relative %: 88 %
Platelets: 432 10*3/uL — ABNORMAL HIGH (ref 150–400)
RBC: 3.94 MIL/uL — ABNORMAL LOW (ref 4.22–5.81)
RDW: 12.2 % (ref 11.5–15.5)
WBC: 22 10*3/uL — ABNORMAL HIGH (ref 4.0–10.5)
nRBC: 0 % (ref 0.0–0.2)

## 2019-06-05 LAB — D-DIMER, QUANTITATIVE: D-Dimer, Quant: 1.64 ug/mL-FEU — ABNORMAL HIGH (ref 0.00–0.50)

## 2019-06-05 LAB — GLUCOSE, CAPILLARY
Glucose-Capillary: 217 mg/dL — ABNORMAL HIGH (ref 70–99)
Glucose-Capillary: 233 mg/dL — ABNORMAL HIGH (ref 70–99)
Glucose-Capillary: 232 mg/dL — ABNORMAL HIGH (ref 70–99)
Glucose-Capillary: 304 mg/dL — ABNORMAL HIGH (ref 70–99)
Glucose-Capillary: 127 mg/dL — ABNORMAL HIGH (ref 70–99)

## 2019-06-05 LAB — ABO/RH: ABO/RH(D): B POS

## 2019-06-05 LAB — PHOSPHORUS: Phosphorus: 3.9 mg/dL (ref 2.5–4.6)

## 2019-06-05 LAB — MAGNESIUM: Magnesium: 2.3 mg/dL (ref 1.7–2.4)

## 2019-06-05 LAB — FERRITIN: Ferritin: 804 ng/mL — ABNORMAL HIGH (ref 24–336)

## 2019-06-05 LAB — C-REACTIVE PROTEIN: CRP: 17.1 mg/dL — ABNORMAL HIGH (ref <1.0)

## 2019-06-05 MED ORDER — HALOPERIDOL LACTATE 5 MG/ML IJ SOLN
5.0000 mg | Freq: Once | INTRAMUSCULAR | Status: AC
  Administered 2019-06-05: 5 mg via INTRAVENOUS
  Filled 2019-06-05: qty 1

## 2019-06-05 MED ORDER — HALOPERIDOL LACTATE 5 MG/ML IJ SOLN: 2.0000 mg | Freq: Four times a day (QID) | INTRAMUSCULAR | Status: DC | PRN

## 2019-06-05 MED ORDER — INSULIN GLARGINE 100 UNIT/ML ~~LOC~~ SOLN
8.0000 [IU] | Freq: Every day | SUBCUTANEOUS | Status: DC
  Administered 2019-06-05 – 2019-06-07 (×3): 8 [IU] via SUBCUTANEOUS
  Filled 2019-06-05 (×4): qty 0.08

## 2019-06-05 MED ORDER — METOPROLOL TARTRATE 25 MG PO TABS: 25.0000 mg | ORAL_TABLET | Freq: Two times a day (BID) | ORAL | Status: DC

## 2019-06-05 MED ORDER — INSULIN ASPART 100 UNIT/ML ~~LOC~~ SOLN
0.0000 [IU] | SUBCUTANEOUS | Status: DC
  Administered 2019-06-05: 16:00:00 11 [IU] via SUBCUTANEOUS
  Administered 2019-06-06 (×2): 2 [IU] via SUBCUTANEOUS
  Administered 2019-06-06: 3 [IU] via SUBCUTANEOUS
  Administered 2019-06-06 (×2): 5 [IU] via SUBCUTANEOUS
  Administered 2019-06-07: 3 [IU] via SUBCUTANEOUS
  Administered 2019-06-07 (×2): 8 [IU] via SUBCUTANEOUS
  Administered 2019-06-07: 0 [IU] via SUBCUTANEOUS

## 2019-06-05 MED ORDER — LORAZEPAM 0.5 MG PO TABS
1.0000 mg | ORAL_TABLET | Freq: Four times a day (QID) | ORAL | Status: DC | PRN
  Administered 2019-06-05: 21:00:00 1 mg via ORAL
  Filled 2019-06-05: qty 2

## 2019-06-05 MED ORDER — TRAZODONE HCL 50 MG PO TABS
100.0000 mg | ORAL_TABLET | Freq: Every evening | ORAL | Status: DC | PRN
  Administered 2019-06-05: 21:00:00 100 mg via ORAL
  Filled 2019-06-05: qty 2

## 2019-06-05 MED ORDER — DEXAMETHASONE 6 MG PO TABS
6.0000 mg | ORAL_TABLET | Freq: Every day | ORAL | Status: DC
  Administered 2019-06-05 – 2019-06-08 (×4): 6 mg via ORAL
  Filled 2019-06-05 (×4): qty 1

## 2019-06-05 MED ORDER — METOPROLOL TARTRATE 25 MG PO TABS
25.0000 mg | ORAL_TABLET | Freq: Two times a day (BID) | ORAL | Status: DC
  Administered 2019-06-05 – 2019-06-08 (×7): 25 mg via ORAL
  Filled 2019-06-05 (×7): qty 1

## 2019-06-05 NOTE — Progress Notes (Signed)
   06/05/19 1321  Family/Significant Other Communication  Family/Significant Other Update Called;Updated (Spoke with wife Judeen Hammans)

## 2019-06-05 NOTE — Progress Notes (Signed)
PROGRESS NOTE    Ethan Pierce  ZOX:[id number] DOB: 23-Jun-1946 DOA: 06/03/2019 PCP: Pearson GrippeKim, James, MD   Brief Narrative:  [age] y.o.BM PMHx HTN, diabetes type 2 uncontrolled with complication,    Presents to the ED for evaluation of hypoxia in setting of recent COVID-19 positive test.  Patient has several recent ED visits for fevers, congestion/headache, and generalized weakness. He had a SARS-CoV-2 test performed on 05/30/2019 which ultimately resulted positive. Before the results came in he turned to: ED on 05/31/2019 as a code sepsis. He was started on empiric Levaquin and was plan to be admitted [facility] however he left AGAINST MEDICAL ADVICE per documentation. Blood cultures drawn 05/31/2019 are no growth to date.  He returnedto the ED 06/01/2019 at which time he was noted to be COVID-19 positive but was clinically stable and was discharged to home to continue his Levaquin and follow-up [facility].  [facility]03[facility]2019 with increased dyspnea on exertion. He says he is walking out to his car and became lightheaded and fell. He reports a minor head wound and scraping his left knee but denies any loss of consciousness. Portable chest x-ray showed bilateral heterogeneous airspace opacities of the lung bases. CT head was without any acute intracranial abnormality. He was plan to be admitted to [facility] for further management of his COVID-19 however he declined admission to [facility] and requested to be discharged home, subsequently signed out AMA.  Per EDP, upon leaving the ED on his way to his car he had difficulty walking and fell injuring his left knee. He says he spoke to a personal friend with house coverage at [facility] who stated they have a bed available for him there if he is to be admitted.  At time of my examination, patient reports feeling somewhat improved. He says he has been  having generalized weakness, dyspnea with exertion, cough productive of white sputum, and fevers. He denies any chills or diaphoresis. He denies any chest pain or palpitations. Denies any abdominal pain, nausea, vomiting, diarrhea, constipation, dysuria, or swelling in his legs. He reports an abrasion to his left knee after the previously mentioned follow-up without any further pain. He again states that he is adamant about not being admitted [facility] but is agreeable to be admitted here [facility].  ED Course: Vitals on return to ED showed BP 162/77, pulse 116, RR 26, temp 21.6 Fahrenheit, SPO2 93% on 2 L supplemental O2 via [place].  Earlier labs are notable forsodium 135, potassium 3.5, BUN 15, creatinine 1.08, serum glucose 154, bicarb 25, WBC 12.2, hemoglobin 11.4, platelets 284,000, lactic acid 1.9, D-dimer 0.97, procalcitonin 0.24, LDH 349, ferritin 442, fibrinogen 730, CRP 21.7. Blood cultures were obtained and pending.  The hospitalist service was consulted to admit for further evaluation management.   Subjective: Afebrile last 24 hours A/O x4, negative abdominal pain, negative N/V, negative SOB   Assessment & Plan:   Principal Problem:   Pneumonia due to COVID-19 virus Active Problems:   Hypertension associated with diabetes (HCC)   Fall   Acute respiratory failure with hypoxia (HCC)   Diabetes mellitus type 2, uncontrolled, with complications (HCC)   Essential hypertension  Covid 19 pneumonia/acute respiratory failure with hypoxia -Remdesivir per pharmacy protocol -Decadron 6 mg daily -Albuterol QID -Flutter valve -Titrate O2 to maintain SPO2> 88%  COVID-19 Labs  Recent Labs    06/03/19  1230 06/04/19 0405 06/05/19 0526  DDIMER 0.97* 1.16* 1.64*  FERRITIN 442* 449*  --   LDH 349*  --   --   CRP 21.7* 21.6*  --     Lab Results  Component Value Date   SARSCOV2NAA Detected (A) 05/30/2019   Essential HTN -Lisinopril 20  mg daily -Hydrochlorothiazide 25 mg daily -11/2 metoprolol 12.5 mg BID   Diabetes type 2 uncontrolled with complication -11/1 Hemoglobin A1c= 8.1 -11/2 Lantus 8 units daily -11/2 moderate SSI -Lipid panel pending  Falls - -    2.  Falls Likely secondary to dehydration and generalized weakness secondary to COVID-19 infection.  Patient noted to be hypovolemic on admission.  Head CT which was done was negative.  Continue IV fluids.  PT/OT.  Follow-up.       DVT prophylaxis: Lovenox per pharmacy Covid protocol Code Status: Full Family Communication:  Disposition Plan: TBD   Consultants:    Procedures/Significant Events:     I have personally reviewed and interpreted all radiology studies and my findings are as above.  VENTILATOR SETTINGS: Room air SPO2 93%   Cultures   Antimicrobials: Anti-infectives (From admission, onward)   Start     Stop   06/05/19 1600  remdesivir 100 mg in sodium chloride 0.9 % 250 mL IVPB     06/08/19 1559   06/04/19 2200  remdesivir 100 mg in sodium chloride 0.9 % 250 mL IVPB  Status:  Discontinued     06/04/19 1731   06/04/19 1900  remdesivir 100 mg in sodium chloride 0.9 % 250 mL IVPB     06/04/19 2005   06/03/19 2200  remdesivir 200 mg in sodium chloride 0.9 % 250 mL IVPB     06/03/19 2330       Devices Anti-infectives (From admission, onward)   Start     Stop   06/05/19 1600  remdesivir 100 mg in sodium chloride 0.9 % 250 mL IVPB     06/08/19 1559   06/04/19 2200  remdesivir 100 mg in sodium chloride 0.9 % 250 mL IVPB  Status:  Discontinued     06/04/19 1731   06/04/19 1900  remdesivir 100 mg in sodium chloride 0.9 % 250 mL IVPB     06/04/19 2005   06/03/19 2200  remdesivir 200 mg in sodium chloride 0.9 % 250 mL IVPB     06/03/19 2330       LINES / TUBES:      Continuous Infusions: . remdesivir 100 mg in NS 250 mL       Objective: Vitals:   06/04/19 1926 06/04/19 1933 06/05/19 0510 06/05/19 0739  BP:   (!) 147/71 (!) 158/88 (!) 146/74  Pulse:  77 86 (!) 113  Resp:  Temp: 98.6 F (37 C)  98.2 F (36.8 C) 98 F (36.7 C)  TempSrc: Oral  Oral Oral  SpO2:  97% 100% 93%  Weight:      Height:        Intake/Output Summary (Last 24 hours) at 06/05/2019 0804 Last data filed at 06/04/2019 2200 Gross per 24 hour  Intake 480 ml  Output 950 ml  Net -470 ml   Filed Weights   06/03/19 2355 06/04/19 1600  Weight: 78 kg 75.4 kg    Examination:  General: A/O x4, no acute respiratory distress Eyes: negative scleral hemorrhage, negative anisocoria, negative icterus ENT: Negative Runny nose, negative gingival bleeding, Neck:  Negative scars, masses, torticollis, lymphadenopathy, JVD Lungs:  Clear to auscultation bilaterally without wheezes or crackles Cardiovascular: Regular rate and rhythm without murmur gallop or rub normal S1 and S2 Abdomen: negative abdominal pain, nondistended, positive soft, bowel sounds, no rebound, no ascites, no appreciable mass Extremities: No significant cyanosis, clubbing, or edema bilateral lower extremities Skin: Negative rashes, lesions, ulcers Psychiatric:  Negative depression, negative anxiety, negative fatigue, negative mania  Central nervous system:  Cranial nerves II through XII intact, tongue/uvula midline, all extremities muscle strength 5/5, sensation intact throughout, negative dysarthria, negative expressive aphasia, negative receptive aphasia.  .     Data Reviewed: Care during the described time interval was provided by me .  I have reviewed this patient's available data, including medical history, events of note, physical examination, and all test results as part of my evaluation.   CBC: Recent Labs  Lab 05/31/19 2005 06/01/19 2330 06/03/19 1227 06/04/19 0405 06/05/19 0526  WBC 7.3 9.5 12.2* 8.3 22.0*  NEUTROABS 5.8 8.3*  --  7.3 19.3*  HGB 11.6* 11.8* 11.4* 10.5* 12.0*  HCT 35.5* 35.3* 35.3* 32.9* 36.5*  MCV 92.4 92.7 93.9 95.1  92.6  PLT 175 204 284 304 160*   Basic Metabolic Panel: Recent Labs  Lab 05/31/19 2005 06/01/19 2330 06/03/19 1227 06/04/19 0405  NA 134* 136 135 139  K 3.2* 3.7 3.5 4.3  CL 97* 101 98 104  CO2 23 24 25 26   GLUCOSE 212* 197* 154* 220*  BUN 22 16 15 15   CREATININE 1.29* 1.14 1.08 0.95  CALCIUM 7.8* 8.3* 8.2* 8.1*  MG  --   --   --  2.2  PHOS  --   --   --  3.1   GFR: Estimated Creatinine Clearance: 67 mL/min (by C-G formula based on SCr of 0.95 mg/dL). Liver Function Tests: Recent Labs  Lab 05/31/19 2005 06/04/19 0405  AST 33 33  ALT 16 19  ALKPHOS 56 50  BILITOT 0.4 0.5  PROT 7.4 6.9  ALBUMIN 3.0* 2.4*   No results for input(s): LIPASE, AMYLASE in the last 168 hours. No results for input(s): AMMONIA in the last 168 hours. Coagulation Profile: Recent Labs  Lab 05/31/19 2005  INR 1.1   Cardiac Enzymes: No results for input(s): CKTOTAL, CKMB, CKMBINDEX, TROPONINI in the last 168 hours. BNP (last 3 results) No results for input(s): PROBNP in the last 8760 hours. HbA1C: Recent Labs    06/04/19 0405  HGBA1C 8.1*   CBG: Recent Labs  Lab 06/04/19 0105 06/04/19 0835 06/04/19 1125 06/04/19 1925 06/05/19 0738  GLUCAP 149* 199* 193* 139* 233*   Lipid Profile: Recent Labs    06/03/19 1230  TRIG 18   Thyroid Function Tests: No results for input(s): TSH, T4TOTAL, FREET4, T3FREE, THYROIDAB in the last 72 hours. Anemia Panel: Recent Labs    06/03/19 1230 06/04/19 0405  FERRITIN 442* 449*   Urine analysis:    Component Value Date/Time   COLORURINE YELLOW 09/09/2012 1601   APPEARANCEUR CLEAR 09/09/2012 1601   LABSPEC 1.029 09/09/2012 1601   PHURINE 5.5 09/09/2012 1601   GLUCOSEU NEGATIVE 09/09/2012 1601   HGBUR NEGATIVE 09/09/2012 1601   BILIRUBINUR NEGATIVE 09/09/2012 1601   KETONESUR NEGATIVE 09/09/2012 1601   PROTEINUR NEGATIVE 09/09/2012 1601   UROBILINOGEN 1.0 09/09/2012 1601   NITRITE NEGATIVE 09/09/2012 1601   LEUKOCYTESUR NEGATIVE  09/09/2012 1601   Sepsis Labs: @LABRCNTIP (procalcitonin:4,lacticidven:4)  ) Recent Results (from the past 240 hour(s))  Novel Coronavirus, NAA ([facility])     Status: Abnormal   Collection Time: 05/30/19  12:10 PM   Specimen: Nasopharyngeal(NP) swabs in vial transport medium   NASOPHARYNGE  TESTING  Result Value Ref Range Status   SARS-CoV-2, NAA Detected (A) Not Detected Final    Comment: This nucleic acid amplification test was developed and its performance characteristics determined by [employer]. Nucleic acid amplification tests include PCR and TMA. This test has not been FDA cleared or approved. This test has been authorized by FDA under an Emergency Use Authorization (EUA). This test is only authorized for the duration of time the declaration that circumstances exist justifying the authorization of the emergency use of in vitro diagnostic tests for detection of SARS-CoV-2 virus and/or diagnosis of COVID-19 infection under section 564(b)(1) of the Act, 21 U.S.C. 782NFA-2(Z) (1), unless the authorization is terminated or revoked sooner. When diagnostic testing is negative, the possibility of a false negative result should be considered in the context of a patient's recent exposures and the presence of clinical signs and symptoms consistent with COVID-19. An individual without symptoms of COVID-19 and who is not shedding SARS-CoV-2 virus would  expect to have a negative (not detected) result in this assay.   Blood Culture (routine x 2)     Status: None   Collection Time: 05/31/19  7:45 PM   Specimen: BLOOD  Result Value Ref Range Status   Specimen Description   Final    BLOOD RIGHT ANTECUBITAL Performed at [facility], [address] [place], [place] [address]    Special Requests   Final    BOTTLES DRAWN AEROBIC AND ANAEROBIC Blood Culture adequate volume Performed at [facility], [address] [place], [place] [address]    Culture    Final    NO GROWTH 5 DAYS Performed at [facility], [address], [place], [place] [address]    Report Status 06/05/2019 FINAL  Final  Blood Culture (routine x 2)     Status: None   Collection Time: 05/31/19  8:00 PM   Specimen: BLOOD RIGHT FOREARM  Result Value Ref Range Status   Specimen Description   Final    BLOOD RIGHT FOREARM Performed at [facility], [address] [place], [place] [address]    Special Requests   Final    BOTTLES DRAWN AEROBIC AND ANAEROBIC Blood Culture adequate volume Performed at [facility], [address] [place], [place] [address]    Culture   Final    NO GROWTH 5 DAYS Performed at [facility], [address] [place], [place] [address]    Report Status 06/05/2019 FINAL  Final  Blood Culture (routine x 2)     Status: None (Preliminary result)   Collection Time: 06/03/19 12:30 PM   Specimen: BLOOD  Result Value Ref Range Status   Specimen Description   Final    BLOOD LEFT ANTECUBITAL Performed at [facility], [address], [place], [place] [address]    Special Requests   Final    BOTTLES DRAWN AEROBIC AND ANAEROBIC Blood Culture adequate volume Performed at [facility], [address], [place], [place] [address]    Culture   Final    NO GROWTH 2 DAYS Performed at [facility], [address] [place], [place] [address]    Report Status PENDING  Incomplete  Blood Culture (routine x 2)     Status: None (Preliminary result)   Collection Time: 06/03/19 12:35 PM  Specimen: BLOOD LEFT FOREARM  Result Value Ref Range Status   Specimen Description   Final    BLOOD LEFT FOREARM Performed at [facility], [address] [place], [place] [address]    Special Requests   Final    BOTTLES DRAWN AEROBIC AND ANAEROBIC Blood Culture results may not be optimal due to an excessive volume of blood received in culture bottles Performed at [facility], [address], [place], [place] [address]    Culture   Final    NO GROWTH 2 DAYS Performed at [facility], [address], [place], [place] [address]    Report Status PENDING  Incomplete         Radiology Studies: Ct Head Wo Contrast  Result Date: 06/03/2019 CLINICAL DATA:  Head trauma EXAM: CT HEAD WITHOUT CONTRAST TECHNIQUE: Contiguous axial images were obtained from the base of the skull through the vertex without intravenous contrast. COMPARISON:  11/26/2013 FINDINGS: Brain: No evidence of acute infarction, hemorrhage, hydrocephalus, extra-axial collection or mass lesion/mass effect. Vascular: No hyperdense vessel or unexpected calcification. Skull: Normal. Negative for fracture or focal lesion. Sinuses/Orbits: No acute finding. Other: None. IMPRESSION: No acute intracranial pathology. Electronically Signed   By: Lauralyn Primes M.D.   On: 06/03/2019 14:41   Dg Chest Port 1 View  Result Date: 06/03/2019 CLINICAL DATA:  Shortness of breath EXAM: PORTABLE CHEST 1 VIEW COMPARISON:  06/01/2019 FINDINGS: The heart size and mediastinal contours are within normal limits. There is new, subtle heterogeneous airspace opacity of the bilateral lung bases, most conspicuous in the right lung base. The visualized skeletal structures are unremarkable. IMPRESSION: There is new, subtle heterogeneous airspace opacity of the bilateral lung bases, most conspicuous in the right lung base, concerning for infection. Electronically Signed   By: Lauralyn Primes M.D.   On: 06/03/2019 13:12        Scheduled Meds: . albuterol  2 puff Inhalation Q6H  . enoxaparin (LOVENOX) injection  40 mg Subcutaneous QHS  . lisinopril  20 mg Oral Daily   And  . hydrochlorothiazide  25 mg Oral Daily  . insulin aspart  0-9 Units Subcutaneous TID WC  . latanoprost  1 drop Both Eyes QHS  . methylPREDNISolone (SOLU-MEDROL) injection  60 mg Intravenous Q12H  . vitamin C  500 mg Oral Daily  . zinc  sulfate  220 mg Oral Daily   Continuous Infusions: . remdesivir 100 mg in NS 250 mL       LOS: 2 days   The patient is critically ill with multiple organ systems failure and requires high complexity decision making for assessment and support, frequent evaluation and titration of therapies, application of advanced monitoring technologies and extensive interpretation of multiple databases. Critical Care Time devoted to patient care services described in this note  Time spent: 40 minutes     Samari Bittinger, Roselind Messier, MD [facility] Pager [phone]  If 7PM-7AM, please contact night-coverage [email or web address] Password [email or web address] 06/05/2019, 8:04 AM

## 2019-06-05 NOTE — Progress Notes (Addendum)
Patient attempting to leave hospital.  Wife not coming to get patient and no safe way to get home.  Son and wife on the phone to [place] patient but patient busy on his personal phone.  States he is not supposed to be at [facility].  Patient has his belongings together, regular clothes on and demanding to leave.  MD in to see patient.  Trazodone 100mg  noted on previous MD notes to be a home PRN med.  Since patient has not slept since he has been here, and states he has trouble sleeping because of his PTSD, Trazodone 100mg  ordered and administered tonight with HS meds.  Per wife, Trazodone makes patient "crazy."  Will ensure trazodone gets d/c'd and will add to allergy list to ensure he is not given med again.   Patient knows he is at [facility] and is able to communicate that he needs the antiviral for 5 days total but still demanding to leave.  Patient is irrational and is not thinking clearly spite being oriented.  Patient still on his phone searching for something.  States that we "set him up" and "are trying to get his money."    MD states he is going to order some Haldol IVP.    Patient now is talking about walking home.  States he will call his lawyer if we do not let him.  Explaining that it is very cold outside and that would not be the safe thing for him at this time.  Will continue to remain with the patient and provide education and assurance that we are here to keep him safe.  0200 - IVP Haldol was effective and patient is currently resting.  Restraints were not needed and never initiated.  1:1 safety sitter at the bedside.  0700 - Patient rested well after order for haldol administered.  Sitter remains at bedside per order for safety.  No acute distress present.  Patient was resting well and Q 4 hour blood sugars not obtained due to patient's night.  HS blood sugar was 127.

## 2019-06-05 NOTE — Progress Notes (Signed)
Shift Summary: Patient very anxious this shift, frequently calling out. Told this nurse that he would not take Albuterol. After speaking to wife, states he never said that. Patient had told this nurse "I'm not taking that. Something in it burns my throat." Education was provided, patient refused. Patient agreed to resume albuterol treatment post talk with wife.

## 2019-06-05 NOTE — Progress Notes (Signed)
Mr. Czerniak is confused and upset, thinking that he is not in the correct hospital. We have tried to redirect and reorient him. His family also tried via video call. He is not physically threatening but becomes verbally aggressive and cannot be redirected. He is adamant about leaving this facility but does not understand where he is. He is posing a danger to himself, is unable to understand the risks of leaving AMA, and will need to held under IVC for his safety.

## 2019-06-05 NOTE — Plan of Care (Signed)
  Problem: Education: Goal: Knowledge of risk factors and measures for prevention of condition will improve Outcome: Progressing   Problem: Coping: Goal: Psychosocial and spiritual needs will be supported Outcome: Progressing   Problem: Respiratory: Goal: Will maintain a patent airway Outcome: Progressing Goal: Complications related to the disease process, condition or treatment will be avoided or minimized Outcome: Progressing   

## 2019-06-05 NOTE — Progress Notes (Signed)
ANTICOAGULATION CONSULT NOTE - Follow Up Consult  Pharmacy Consult for Lovenox Indication: VTE prophylaxis  Patient Measurements: Height: 5\' 8"  (172.7 cm) Weight: 166 lb 3.2 oz (75.4 kg) IBW/kg (Calculated) : 68.4   Estimated Creatinine Clearance: 56.3 mL/min (by C-G formula based on SCr of 1.13 mg/dL).   Medications:  Scheduled:  . albuterol  2 puff Inhalation Q6H  . dexamethasone  6 mg Oral Daily  . enoxaparin (LOVENOX) injection  40 mg Subcutaneous QHS  . lisinopril  20 mg Oral Daily   And  . hydrochlorothiazide  25 mg Oral Daily  . insulin aspart  0-15 Units Subcutaneous Q4H  . insulin glargine  8 Units Subcutaneous Daily  . latanoprost  1 drop Both Eyes QHS  . metoprolol tartrate  25 mg Oral BID  . vitamin C  500 mg Oral Daily  . zinc sulfate  220 mg Oral Daily   Infusions:  . remdesivir 100 mg in NS 250 mL      Assessment: 2 yoM admitted with COVID-19 pneumonia.  He has been started on prophylactic Lovenox 40mg  SQ q24h.  This remains appropriate prophylaxis for weight 75 kg, BMI 25, and D-dimer < 5.  CBC has low/stable Hgb 12, Plt elevated at 432.  No bleeding or complications reported.  CrCl > 30 ml/min.  Goal of Therapy:  Monitor platelets by anticoagulation protocol: Yes   Plan:  Continue Lovenox 40 mg SQ q24h. Pharmacy to sign off consult.  Gretta Arab PharmD, BCPS Clinical pharmacist phone 7am- 5pm: [phone] 06/05/2019 2:19 PM

## 2019-06-05 NOTE — Evaluation (Signed)
Physical Therapy Evaluation Patient Details Name: Ethan Pierce MRN: [id number] DOB: Aug 30, 1945 Today's Date: 06/05/2019   History of Present Illness  Pt is a 73 y.o. male admitted 06/03/19 with hypoxia in the setting of recent COVID-19 positive test (tested on 10/27). Pt with falls due to generalized weakness and lightheadedness. PMH includes HTN, DM2.    Clinical Impression  Pt presents with an overall decrease in functional mobility secondary to above. PTA, pt independent and lives with wife. Today, pt moving well with RW at supervision-level. DOE 3/4 and SpO2 down to 88% on RA with ambulation, quickly returning to 91-92% with standing rest and deep breathing. Educ re: incentive spirometer, energy conservation strategies, activity recommendations. Pt would benefit from continued acute PT services to maximize functional mobility and independence prior to d/c with HHPT services.     Follow Up Recommendations Home health PT;Supervision for mobility/OOB    Equipment Recommendations  Rolling walker with 5" wheels    Recommendations for Other Services       Precautions / Restrictions Precautions Precautions: Fall Restrictions Weight Bearing Restrictions: No      Mobility  Bed Mobility               General bed mobility comments: Received sitting in recliner  Transfers Overall transfer level: Needs assistance Equipment used: Rolling walker (2 wheeled) Transfers: Sit to/from Stand Sit to Stand: Supervision            Ambulation/Gait Ambulation/Gait assistance: Supervision Gait Distance (Feet): 150 Feet Assistive device: Rolling walker (2 wheeled) Gait Pattern/deviations: Step-through pattern;Decreased stride length Gait velocity: Decreased Gait velocity interpretation: 1.31 - 2.62 ft/sec, indicative of limited community ambulator General Gait Details: Pt did not want to try gait training without DME, requesting RW. Slow, steady gait with supervision for safety;  intermittent cues for safety with RW (i.e. stopping if taking one hand off RW to fix face mask). DOE 3/4, pt aware of need to breathe deep, but requiring intermittent cues to stop talking and focus on breathing. SpO2 down to 88% on RA  Stairs            Wheelchair Mobility    Modified Rankin (Stroke Patients Only)       Balance Overall balance assessment: Needs assistance   Sitting balance-Leahy Scale: Good       Standing balance-Leahy Scale: Fair Standing balance comment: Can static stand without UE support                             Pertinent Vitals/Pain Pain Assessment: No/denies pain    Home Living Family/patient expects to be discharged to:: Private residence Living Arrangements: Spouse/significant other;Other relatives Available Help at Discharge: Family Type of Home: House Home Access: Stairs to enter     Home Layout: Multi-level;Able to live on main level with bedroom/bathroom;Bed/bath upstairs Home Equipment: Walker - standard;Shower seat;Hospital bed Additional Comments: Will have hospital bed to sleep on main floor, half bath on main floor. Shower upstairs; chair lift on steps    Prior Function Level of Independence: Independent               Hand Dominance        Extremity/Trunk Assessment   Upper Extremity Assessment Upper Extremity Assessment: Overall WFL for tasks assessed    Lower Extremity Assessment Lower Extremity Assessment: Overall WFL for tasks assessed       Communication   Communication: No difficulties  Cognition Arousal/Alertness: Awake/alert  Behavior During Therapy: WFL for tasks assessed/performed Overall Cognitive Status: Within Functional Limits for tasks assessed                                 General Comments: WFL for simple tasks, some difficulty multitasking, likely baseline cognition      General Comments General comments (skin integrity, edema, etc.): Very limited pull on  incentive spirometer. Educ re: energy conservation strategies, importance of mobility, return to activity recommendations    Exercises     Assessment/Plan    PT Assessment Patient needs continued PT services  PT Problem List Decreased strength;Decreased activity tolerance;Decreased balance;Decreased mobility;Cardiopulmonary status limiting activity       PT Treatment Interventions DME instruction;Gait training;Stair training;Functional mobility training;Therapeutic activities;Therapeutic exercise;Balance training;Patient/family education    PT Goals (Current goals can be found in the Care Plan section)  Acute Rehab PT Goals Patient Stated Goal: Return home PT Goal Formulation: With patient Time For Goal Achievement: 06/19/19 Potential to Achieve Goals: Good    Frequency Min 3X/week   Barriers to discharge        Co-evaluation               AM-PAC PT "6 Clicks" Mobility  Outcome Measure Help needed turning from your back to your side while in a flat bed without using bedrails?: None Help needed moving from lying on your back to sitting on the side of a flat bed without using bedrails?: None Help needed moving to and from a bed to a chair (including a wheelchair)?: None Help needed standing up from a chair using your arms (e.g., wheelchair or bedside chair)?: A Little Help needed to walk in hospital room?: A Little Help needed climbing 3-5 steps with a railing? : A Little 6 Click Score: 21    End of Session   Activity Tolerance: Patient tolerated treatment well Patient left: in chair;with call bell/phone within reach;with chair alarm set Nurse Communication: Mobility status PT Visit Diagnosis: Other abnormalities of gait and mobility (R26.89)    Time: 3614-4315 PT Time Calculation (min) (ACUTE ONLY): 27 min   Charges:   PT Evaluation $PT Eval Moderate Complexity: 1 Mod PT Treatments $Gait Training: 8-22 mins   Mabeline Caras, PT, DPT Acute Rehabilitation  Services  Pager [phone] Office [place] 06/05/2019, 12:54 PM

## 2019-06-06 LAB — COMPREHENSIVE METABOLIC PANEL
ALT: 36 U/L (ref 0–44)
AST: 62 U/L — ABNORMAL HIGH (ref 15–41)
Albumin: 2.7 g/dL — ABNORMAL LOW (ref 3.5–5.0)
Alkaline Phosphatase: 61 U/L (ref 38–126)
Anion gap: 11 (ref 5–15)
BUN: 36 mg/dL — ABNORMAL HIGH (ref 8–23)
CO2: 26 mmol/L (ref 22–32)
Calcium: 8.5 mg/dL — ABNORMAL LOW (ref 8.9–10.3)
Chloride: 106 mmol/L (ref 98–111)
Creatinine, Ser: 1.15 mg/dL (ref 0.61–1.24)
GFR calc Af Amer: >60 mL/min (ref >60)
GFR calc non Af Amer: >60 mL/min (ref >60)
Glucose, Bld: 126 mg/dL — ABNORMAL HIGH (ref 70–99)
Potassium: 3.4 mmol/L — ABNORMAL LOW (ref 3.5–5.1)
Sodium: 143 mmol/L (ref 135–145)
Total Bilirubin: 0.8 mg/dL (ref 0.3–1.2)
Total Protein: 7.2 g/dL (ref 6.5–8.1)

## 2019-06-06 LAB — CBC WITH DIFFERENTIAL/PLATELET
Abs Immature Granulocytes: 0.5 10*3/uL — ABNORMAL HIGH (ref 0.00–0.07)
Basophils Absolute: 0 10*3/uL (ref 0.0–0.1)
Basophils Relative: 0 %
Eosinophils Absolute: 0 10*3/uL (ref 0.0–0.5)
Eosinophils Relative: 0 %
HCT: 37.6 % — ABNORMAL LOW (ref 39.0–52.0)
Hemoglobin: 12.2 g/dL — ABNORMAL LOW (ref 13.0–17.0)
Immature Granulocytes: 2 %
Lymphocytes Relative: 6 %
Lymphs Abs: 1.2 10*3/uL (ref 0.7–4.0)
MCH: 30.3 pg (ref 26.0–34.0)
MCHC: 32.4 g/dL (ref 30.0–36.0)
MCV: 93.3 fL (ref 80.0–100.0)
Monocytes Absolute: 1.1 10*3/uL — ABNORMAL HIGH (ref 0.1–1.0)
Monocytes Relative: 5 %
Neutro Abs: 18.3 10*3/uL — ABNORMAL HIGH (ref 1.7–7.7)
Neutrophils Relative %: 87 %
Platelets: 420 10*3/uL — ABNORMAL HIGH (ref 150–400)
RBC: 4.03 MIL/uL — ABNORMAL LOW (ref 4.22–5.81)
RDW: 12.5 % (ref 11.5–15.5)
WBC: 21.1 10*3/uL — ABNORMAL HIGH (ref 4.0–10.5)
nRBC: 0 % (ref 0.0–0.2)

## 2019-06-06 LAB — GLUCOSE, CAPILLARY
Glucose-Capillary: 133 mg/dL — ABNORMAL HIGH (ref 70–99)
Glucose-Capillary: 148 mg/dL — ABNORMAL HIGH (ref 70–99)
Glucose-Capillary: 180 mg/dL — ABNORMAL HIGH (ref 70–99)
Glucose-Capillary: 206 mg/dL — ABNORMAL HIGH (ref 70–99)
Glucose-Capillary: 225 mg/dL — ABNORMAL HIGH (ref 70–99)

## 2019-06-06 LAB — C-REACTIVE PROTEIN: CRP: 8.2 mg/dL — ABNORMAL HIGH (ref <1.0)

## 2019-06-06 LAB — MAGNESIUM: Magnesium: 2.3 mg/dL (ref 1.7–2.4)

## 2019-06-06 LAB — D-DIMER, QUANTITATIVE: D-Dimer, Quant: 5.51 ug/mL-FEU — ABNORMAL HIGH (ref 0.00–0.50)

## 2019-06-06 LAB — PHOSPHORUS: Phosphorus: 4.2 mg/dL (ref 2.5–4.6)

## 2019-06-06 LAB — FERRITIN: Ferritin: 722 ng/mL — ABNORMAL HIGH (ref 24–336)

## 2019-06-06 LAB — LACTATE DEHYDROGENASE: LDH: 449 U/L — ABNORMAL HIGH (ref 98–192)

## 2019-06-06 MED ORDER — HALOPERIDOL LACTATE 5 MG/ML IJ SOLN
2.0000 mg | Freq: Four times a day (QID) | INTRAMUSCULAR | Status: DC
  Administered 2019-06-06 – 2019-06-08 (×8): 2 mg via INTRAVENOUS
  Filled 2019-06-06 (×9): qty 1

## 2019-06-06 NOTE — Progress Notes (Signed)
PROGRESS NOTE    Ethan Pierce  QQP:[id number] DOB: 1945/12/15 DOA: 06/03/2019 PCP: Pearson Grippe, MD   Brief Narrative:  73 y.o.BM PMHx HTN, diabetes type 2 uncontrolled with complication,    Presents to the ED for evaluation of hypoxia in setting of recent COVID-19 positive test.  Patient has several recent ED visits for fevers, congestion/headache, and generalized weakness. He had a SARS-CoV-2 test performed on 05/30/2019 which ultimately resulted positive. Before the results came in he turned to: ED on 05/31/2019 as a code sepsis. He was started on empiric Levaquin and was plan to be admitted to [facility] however he left AGAINST MEDICAL ADVICE per documentation. Blood cultures drawn 05/31/2019 are no growth to date.  He returnedto the ED 06/01/2019 at which time he was noted to be COVID-19 positive but was clinically stable and was discharged to home to continue his Levaquin and follow-up with the [facility] hospital.  He presented to the [facility] long ED 06/03/2019 with increased dyspnea on exertion. He says he is walking out to his car and became lightheaded and fell. He reports a minor head wound and scraping his left knee but denies any loss of consciousness. Portable chest x-ray showed bilateral heterogeneous airspace opacities of the lung bases. CT head was without any acute intracranial abnormality. He was plan to be admitted to [facility] for further management of his COVID-19 however he declined admission to [facility] and requested to be discharged home, subsequently signed out AMA.  Per EDP, upon leaving the ED on his way to his car he had difficulty walking and fell injuring his left knee. He says he spoke to a personal friend with house coverage at [facility] who stated they have a bed available for him there if he is to be admitted.  At time of my examination, patient reports feeling somewhat improved. He says he has been  having generalized weakness, dyspnea with exertion, cough productive of white sputum, and fevers. He denies any chills or diaphoresis. He denies any chest pain or palpitations. Denies any abdominal pain, nausea, vomiting, diarrhea, constipation, dysuria, or swelling in his legs. He reports an abrasion to his left knee after the previously mentioned follow-up without any further pain. He again states that he is adamant about not being admitted to [facility] but is agreeable to be admitted here at [facility].  ED Course: Vitals on return to ED showed BP 162/77, pulse 116, RR 26, temp 21.6 Fahrenheit, SPO2 93% on 2 L supplemental O2 via [place].  Earlier labs are notable forsodium 135, potassium 3.5, BUN 15, creatinine 1.08, serum glucose 154, bicarb 25, WBC 12.2, hemoglobin 11.4, platelets 284,000, lactic acid 1.9, D-dimer 0.97, procalcitonin 0.24, LDH 349, ferritin 442, fibrinogen 730, CRP 21.7. Blood cultures were obtained and pending.  The hospitalist service was consulted to admit for further evaluation management.   Subjective: 11/3 file last 24 hours, IVCD overnight.  A/O x4, negative CP, negative S OB, displaying negative belligerent behavior.    Assessment & Plan:   Principal Problem:   Pneumonia due to COVID-19 virus Active Problems:   Hypertension associated with diabetes (HCC)   Fall   Acute respiratory failure with hypoxia (HCC)   Diabetes mellitus type 2, uncontrolled, with complications (HCC)   Essential hypertension  Covid 19 pneumonia/acute respiratory failure with hypoxia -Remdesivir per pharmacy protocol -Decadron 6 mg daily -Albuterol QID -Flutter valve -Titrate O2 to maintain SPO2> 88%  COVID-19 Labs  Recent Labs    06/03/19 1230 06/04/19 0405 06/05/19 0526  DDIMER 0.97* 1.16* 1.64*  FERRITIN 442* 449* 804*  LDH 349*  --   --   CRP 21.7* 21.6* 17.1*    Lab Results  Component Value Date   SARSCOV2NAA Detected (A)  05/30/2019   Essential HTN -Lisinopril 20 mg daily -Hydrochlorothiazide 25 mg daily -11/2 metoprolol 12.5 mg BID   Diabetes type 2 uncontrolled with complication -11/1 Hemoglobin A1c= 8.1 -11/2 Lantus 8 units daily -11/2 moderate SSI -Lipid panel pending  Falls -Likely secondary to dehydration secondary to Covid infection. -PT/OT consult will be needed once closer to discharge date -Head CT was negative  Dementia/sundowning -11/2 patient became verbally belligerent attempting to leave hospital AMA was not sure where he was felt that he was in the wrong hospital.  Per RN patient's wife and son FaceTime then and tried to convince him that he was where he needed to be and was receiving appropriate care to no avail.  Patient IVC for his own safety. -Administered Haldol which appeared to calm him down. -11/3 Haldol 2 mg QID     DVT prophylaxis: Lovenox per pharmacy Covid protocol Code Status: Full Family Communication: 11/3 spoke at length with Ms. Ether Griffins explained plan of care answered all questions Disposition Plan: TBD   Consultants:    Procedures/Significant Events:     I have personally reviewed and interpreted all radiology studies and my findings are as above.  VENTILATOR SETTINGS: Room air SPO2 93%   Cultures   Antimicrobials: Anti-infectives (From admission, onward)   Start     Stop   06/05/19 1600  remdesivir 100 mg in sodium chloride 0.9 % 250 mL IVPB     06/08/19 1559   06/04/19 2200  remdesivir 100 mg in sodium chloride 0.9 % 250 mL IVPB  Status:  Discontinued     06/04/19 1731   06/04/19 1900  remdesivir 100 mg in sodium chloride 0.9 % 250 mL IVPB     06/04/19 2005   06/03/19 2200  remdesivir 200 mg in sodium chloride 0.9 % 250 mL IVPB     06/03/19 2330       Devices    LINES / TUBES:      Continuous Infusions: . remdesivir 100 mg in NS 250 mL Stopped (06/05/19 1645)     Objective: Vitals:   06/05/19 1455 06/05/19 2136  06/06/19 0004 06/06/19 0731  BP: (!) 146/72 (!) 162/69 129/80 (!) 124/52  Pulse: (!) 113 95 92 68  Resp: Temp: 98.5 F (36.9 C) 98.1 F (36.7 C) 98.4 F (36.9 C) 98.2 F (36.8 C)  TempSrc: Oral Oral Oral Axillary  SpO2: 96% 95%  100%  Weight:      Height:        Intake/Output Summary (Last 24 hours) at 06/06/2019 1610 Last data filed at 06/06/2019 0845 Gross per 24 hour  Intake 756.25 ml  Output 100 ml  Net 656.25 ml   Filed Weights   06/03/19 2355 06/04/19 1600  Weight: 78 kg 75.4 kg   Physical Exam:  General: A/O x4, no acute respiratory distress Eyes: negative scleral hemorrhage, negative anisocoria, negative icterus ENT: Negative Runny nose, negative gingival bleeding, Neck:  Negative scars, masses, torticollis, lymphadenopathy, JVD Lungs: Clear to auscultation bilaterally without wheezes or crackles Cardiovascular: Regular rate and rhythm without murmur gallop or rub normal S1 and S2 Abdomen: negative abdominal pain, nondistended, positive soft, bowel sounds, no rebound, no ascites,  no appreciable mass Extremities: No significant cyanosis, clubbing, or edema bilateral lower extremities Skin: Negative rashes, lesions, ulcers Psychiatric:  Negative depression, negative anxiety, negative fatigue, negative mania  Central nervous system:  Cranial nerves II through XII intact, tongue/uvula midline, all extremities muscle strength 5/5, sensation intact throughout, negative dysarthria, negative expressive aphasia, negative receptive aphasia.      Data Reviewed: Care during the described time interval was provided by me .  I have reviewed this patient's available data, including medical history, events of note, physical examination, and all test results as part of my evaluation.   CBC: Recent Labs  Lab 05/31/19 2005 06/01/19 2330 06/03/19 1227 06/04/19 0405 06/05/19 0526  WBC 7.3 9.5 12.2* 8.3 22.0*  NEUTROABS 5.8 8.3*  --  7.3 19.3*  HGB 11.6* 11.8*  11.4* 10.5* 12.0*  HCT 35.5* 35.3* 35.3* 32.9* 36.5*  MCV 92.4 92.7 93.9 95.1 92.6  PLT 175 204 284 304 643*   Basic Metabolic Panel: Recent Labs  Lab 05/31/19 2005 06/01/19 2330 06/03/19 1227 06/04/19 0405 06/05/19 0526  NA 134* 136 135 139 140  K 3.2* 3.7 3.5 4.3 3.4*  CL 97* 101 98 104 103  CO2 23 24 25 26  21*  GLUCOSE 212* 197* 154* 220* 232*  BUN 22 16 15 15  25*  CREATININE 1.29* 1.14 1.08 0.95 1.13  CALCIUM 7.8* 8.3* 8.2* 8.1* 8.7*  MG  --   --   --  2.2 2.3  PHOS  --   --   --  3.1 3.9   GFR: Estimated Creatinine Clearance: 56.3 mL/min (by C-G formula based on SCr of 1.13 mg/dL). Liver Function Tests: Recent Labs  Lab 05/31/19 2005 06/04/19 0405 06/05/19 0526  AST 33 33 51*  ALT 16 19 29   ALKPHOS 56 50 64  BILITOT 0.4 0.5 0.7  PROT 7.4 6.9 7.6  ALBUMIN 3.0* 2.4* 2.9*   No results for input(s): LIPASE, AMYLASE in the last 168 hours. No results for input(s): AMMONIA in the last 168 hours. Coagulation Profile: Recent Labs  Lab 05/31/19 2005  INR 1.1   Cardiac Enzymes: No results for input(s): CKTOTAL, CKMB, CKMBINDEX, TROPONINI in the last 168 hours. BNP (last 3 results) No results for input(s): PROBNP in the last 8760 hours. HbA1C: Recent Labs    06/04/19 0405  HGBA1C 8.1*   CBG: Recent Labs  Lab 06/05/19 0738 06/05/19 1117 06/05/19 1547 06/05/19 2127 06/06/19 0729  GLUCAP 233* 232* 304* 127* 133*   Lipid Profile: Recent Labs    06/03/19 1230  TRIG 18   Thyroid Function Tests: No results for input(s): TSH, T4TOTAL, FREET4, T3FREE, THYROIDAB in the last 72 hours. Anemia Panel: Recent Labs    06/04/19 0405 06/05/19 0526  FERRITIN 449* 804*   Urine analysis:    Component Value Date/Time   COLORURINE YELLOW 09/09/2012 1601   APPEARANCEUR CLEAR 09/09/2012 1601   LABSPEC 1.029 09/09/2012 1601   PHURINE 5.5 09/09/2012 1601   GLUCOSEU NEGATIVE 09/09/2012 1601   HGBUR NEGATIVE 09/09/2012 1601   BILIRUBINUR NEGATIVE 09/09/2012 1601    KETONESUR NEGATIVE 09/09/2012 1601   PROTEINUR NEGATIVE 09/09/2012 1601   UROBILINOGEN 1.0 09/09/2012 1601   NITRITE NEGATIVE 09/09/2012 1601   LEUKOCYTESUR NEGATIVE 09/09/2012 1601   Sepsis Labs: @LABRCNTIP (procalcitonin:4,lacticidven:4)  ) Recent Results (from the past 240 hour(s))  Novel Coronavirus, NAA ([facility])     Status: Abnormal   Collection Time: 05/30/19 12:10 PM   Specimen: Nasopharyngeal(NP) swabs in vial transport medium   NASOPHARYNGE  TESTING  Result Value Ref Range Status   SARS-CoV-2, NAA Detected (A) Not Detected Final    Comment: This nucleic acid amplification test was developed and its performance characteristics determined by [employer]. Nucleic acid amplification tests include PCR and TMA. This test has not been FDA cleared or approved. This test has been authorized by FDA under an Emergency Use Authorization (EUA). This test is only authorized for the duration of time the declaration that circumstances exist justifying the authorization of the emergency use of in vitro diagnostic tests for detection of SARS-CoV-2 virus and/or diagnosis of COVID-19 infection under section 564(b)(1) of the Act, 21 U.S.C. 440HKV-4(Q) (1), unless the authorization is terminated or revoked sooner. When diagnostic testing is negative, the possibility of a false negative result should be considered in the context of a patient's recent exposures and the presence of clinical signs and symptoms consistent with COVID-19. An individual without symptoms of COVID-19 and who is not shedding SARS-CoV-2 virus would  expect to have a negative (not detected) result in this assay.   Blood Culture (routine x 2)     Status: None   Collection Time: 05/31/19  7:45 PM   Specimen: BLOOD  Result Value Ref Range Status   Specimen Description   Final    BLOOD RIGHT ANTECUBITAL Performed at [facility], [address] [place], [place] [address]    Special Requests    Final    BOTTLES DRAWN AEROBIC AND ANAEROBIC Blood Culture adequate volume Performed at [facility], [address] [place], [place] [address]    Culture   Final    NO GROWTH 5 DAYS Performed at [facility], [address], [place], [place] [address]    Report Status 06/05/2019 FINAL  Final  Blood Culture (routine x 2)     Status: None   Collection Time: 05/31/19  8:00 PM   Specimen: BLOOD RIGHT FOREARM  Result Value Ref Range Status   Specimen Description   Final    BLOOD RIGHT FOREARM Performed at [facility], [address], [place], [place] [address]    Special Requests   Final    BOTTLES DRAWN AEROBIC AND ANAEROBIC Blood Culture adequate volume Performed at [facility], [address] [place], [place] [address]    Culture   Final    NO GROWTH 5 DAYS Performed at [facility], [address], [place], [place] [address]    Report Status 06/05/2019 FINAL  Final  Blood Culture (routine x 2)     Status: None (Preliminary result)   Collection Time: 06/03/19 12:30 PM   Specimen: BLOOD  Result Value Ref Range Status   Specimen Description   Final    BLOOD LEFT ANTECUBITAL Performed at [facility], [address] [place], [place] [address]    Special Requests   Final    BOTTLES DRAWN AEROBIC AND ANAEROBIC Blood Culture adequate volume Performed at [facility], [address], [place], [place] [address]    Culture   Final    NO GROWTH 2 DAYS Performed at [facility], [address], [place], [place] [address]    Report Status PENDING  Incomplete  Blood Culture (routine x 2)     Status: None (Preliminary result)   Collection Time: 06/03/19 12:35 PM   Specimen: BLOOD LEFT FOREARM  Result Value Ref Range Status   Specimen Description   Final  BLOOD LEFT FOREARM Performed at [facility], [address] [place], [place] [address]    Special Requests   Final     BOTTLES DRAWN AEROBIC AND ANAEROBIC Blood Culture results may not be optimal due to an excessive volume of blood received in culture bottles Performed at [facility], [address] [place], [place] [address]    Culture   Final    NO GROWTH 2 DAYS Performed at [facility], [address] [place], [place] [address]    Report Status PENDING  Incomplete         Radiology Studies: No results found.      Scheduled Meds: . albuterol  2 puff Inhalation Q6H  . dexamethasone  6 mg Oral Daily  . enoxaparin (LOVENOX) injection  40 mg Subcutaneous QHS  . lisinopril  20 mg Oral Daily   And  . hydrochlorothiazide  25 mg Oral Daily  . insulin aspart  0-15 Units Subcutaneous Q4H  . insulin glargine  8 Units Subcutaneous Daily  . latanoprost  1 drop Both Eyes QHS  . metoprolol tartrate  25 mg Oral BID  . vitamin C  500 mg Oral Daily  . zinc sulfate  220 mg Oral Daily   Continuous Infusions: . remdesivir 100 mg in NS 250 mL Stopped (06/05/19 1645)     LOS: 3 days   The patient is critically ill with multiple organ systems failure and requires high complexity decision making for assessment and support, frequent evaluation and titration of therapies, application of advanced monitoring technologies and extensive interpretation of multiple databases. Critical Care Time devoted to patient care services described in this note  Time spent: 40 minutes     Ethan Pierce, Roselind MessierURTIS J, MD [facility] Pager [phone]  If 7PM-7AM, please contact night-coverage [email or web address] Password [email or web address] 06/06/2019, 9:04 AM

## 2019-06-06 NOTE — Progress Notes (Signed)
   06/06/19 0900  Family/Significant Other Communication  Family/Significant Other Update Called;Updated (Wife Ethan Pierce)

## 2019-06-06 NOTE — Plan of Care (Signed)
  Problem: Education: Goal: Knowledge of risk factors and measures for prevention of condition will improve Outcome: Progressing   Problem: Coping: Goal: Psychosocial and spiritual needs will be supported Outcome: Progressing   Problem: Respiratory: Goal: Will maintain a patent airway Outcome: Progressing Goal: Complications related to the disease process, condition or treatment will be avoided or minimized Outcome: Progressing   

## 2019-06-07 ENCOUNTER — Institutional Professional Consult (permissible substitution): Payer: Non-veteran care | Admitting: Critical Care Medicine

## 2019-06-07 DIAGNOSIS — E118 Type 2 diabetes mellitus with unspecified complications: Secondary | ICD-10-CM

## 2019-06-07 DIAGNOSIS — E1165 Type 2 diabetes mellitus with hyperglycemia: Secondary | ICD-10-CM

## 2019-06-07 DIAGNOSIS — E1159 Type 2 diabetes mellitus with other circulatory complications: Secondary | ICD-10-CM

## 2019-06-07 DIAGNOSIS — J9601 Acute respiratory failure with hypoxia: Secondary | ICD-10-CM

## 2019-06-07 DIAGNOSIS — I10 Essential (primary) hypertension: Secondary | ICD-10-CM

## 2019-06-07 LAB — COMPREHENSIVE METABOLIC PANEL
ALT: 43 U/L (ref 0–44)
AST: 59 U/L — ABNORMAL HIGH (ref 15–41)
Albumin: 2.7 g/dL — ABNORMAL LOW (ref 3.5–5.0)
Alkaline Phosphatase: 63 U/L (ref 38–126)
Anion gap: 12 (ref 5–15)
BUN: 37 mg/dL — ABNORMAL HIGH (ref 8–23)
CO2: 27 mmol/L (ref 22–32)
Calcium: 8.2 mg/dL — ABNORMAL LOW (ref 8.9–10.3)
Chloride: 104 mmol/L (ref 98–111)
Creatinine, Ser: 1.01 mg/dL (ref 0.61–1.24)
GFR calc Af Amer: >60 mL/min (ref >60)
GFR calc non Af Amer: >60 mL/min (ref >60)
Glucose, Bld: 90 mg/dL (ref 70–99)
Potassium: 3.1 mmol/L — ABNORMAL LOW (ref 3.5–5.1)
Sodium: 143 mmol/L (ref 135–145)
Total Bilirubin: 0.9 mg/dL (ref 0.3–1.2)
Total Protein: 6.9 g/dL (ref 6.5–8.1)

## 2019-06-07 LAB — FERRITIN: Ferritin: 626 ng/mL — ABNORMAL HIGH (ref 24–336)

## 2019-06-07 LAB — CBC WITH DIFFERENTIAL/PLATELET
Abs Immature Granulocytes: 0.25 10*3/uL — ABNORMAL HIGH (ref 0.00–0.07)
Basophils Absolute: 0 10*3/uL (ref 0.0–0.1)
Basophils Relative: 0 %
Eosinophils Absolute: 0 10*3/uL (ref 0.0–0.5)
Eosinophils Relative: 0 %
HCT: 36.1 % — ABNORMAL LOW (ref 39.0–52.0)
Hemoglobin: 11.8 g/dL — ABNORMAL LOW (ref 13.0–17.0)
Immature Granulocytes: 2 %
Lymphocytes Relative: 11 %
Lymphs Abs: 1.6 10*3/uL (ref 0.7–4.0)
MCH: 30.5 pg (ref 26.0–34.0)
MCHC: 32.7 g/dL (ref 30.0–36.0)
MCV: 93.3 fL (ref 80.0–100.0)
Monocytes Absolute: 1.3 10*3/uL — ABNORMAL HIGH (ref 0.1–1.0)
Monocytes Relative: 9 %
Neutro Abs: 12 10*3/uL — ABNORMAL HIGH (ref 1.7–7.7)
Neutrophils Relative %: 78 %
Platelets: 412 10*3/uL — ABNORMAL HIGH (ref 150–400)
RBC: 3.87 MIL/uL — ABNORMAL LOW (ref 4.22–5.81)
RDW: 12.8 % (ref 11.5–15.5)
WBC: 15.2 10*3/uL — ABNORMAL HIGH (ref 4.0–10.5)
nRBC: 0 % (ref 0.0–0.2)

## 2019-06-07 LAB — D-DIMER, QUANTITATIVE: D-Dimer, Quant: 2.82 ug/mL-FEU — ABNORMAL HIGH (ref 0.00–0.50)

## 2019-06-07 LAB — MAGNESIUM: Magnesium: 2 mg/dL (ref 1.7–2.4)

## 2019-06-07 LAB — C-REACTIVE PROTEIN: CRP: 6.6 mg/dL — ABNORMAL HIGH (ref <1.0)

## 2019-06-07 LAB — GLUCOSE, CAPILLARY
Glucose-Capillary: 75 mg/dL (ref 70–99)
Glucose-Capillary: 115 mg/dL — ABNORMAL HIGH (ref 70–99)
Glucose-Capillary: 288 mg/dL — ABNORMAL HIGH (ref 70–99)

## 2019-06-07 LAB — LACTATE DEHYDROGENASE: LDH: 406 U/L — ABNORMAL HIGH (ref 98–192)

## 2019-06-07 LAB — PHOSPHORUS: Phosphorus: 3.6 mg/dL (ref 2.5–4.6)

## 2019-06-07 NOTE — Progress Notes (Addendum)
Physical Therapy Treatment Patient Details Name: Ethan Pierce MRN: [id number] DOB: 1945/11/04 Today's Date: 06/07/2019    History of Present Illness Pt is a 73 y.o. male admitted 06/03/19 with hypoxia in the setting of recent COVID-19 positive test (tested on 10/27). Pt with falls due to generalized weakness and lightheadedness. PMH includes HTN, DM2.    PT Comments    Pt making progress with functional mobility. He is better able to to follow cues and shows an improvement in balance and coordination.  Follow Up Recommendations  Home health PT;Supervision for mobility/OOB     Equipment Recommendations  Rolling walker with 5" wheels    Recommendations for Other Services       Precautions / Restrictions Precautions Precautions: Fall;Other (comment)(elopement) Precaution Comments: had sitter at time of tx Restrictions Weight Bearing Restrictions: No    Mobility  Bed Mobility               General bed mobility comments: pt recieved sitting in recliner w/ sitter inr oom  Transfers Overall transfer level: Needs assistance Equipment used: Rolling walker (2 wheeled) Transfers: Sit to/from Stand Sit to Stand: Supervision         General transfer comment: stands from recliner, able to adjust seat to upright, also able to stand from regulat commode  Ambulation/Gait Ambulation/Gait assistance: Min guard Gait Distance (Feet): 200 Feet Assistive device: Rolling walker (2 wheeled) Gait Pattern/deviations: Step-through pattern     General Gait Details: arrived to find pt on room air and is not atteched to monitors. Pt is agreeable to ambulation w/ RW and min guard assist, ambulated slighlty increased distance than previous session pt noted to be calm and follwo cues as instructed   Stairs             Wheelchair Mobility    Modified Rankin (Stroke Patients Only)       Balance Overall balance assessment: Needs assistance;History of Falls Sitting-balance  support: Feet supported Sitting balance-Leahy Scale: Good     Standing balance support: Single extremity supported Standing balance-Leahy Scale: Fair Standing balance comment: can stand with single limb on walker and complete peri care, no LOBS                            Cognition Arousal/Alertness: Awake/alert Behavior During Therapy: WFL for tasks assessed/performed Overall Cognitive Status: Within Functional Limits for tasks assessed(able to complete most tasks given)                                 General Comments: has great diffiuclty with understanding concept for use of IS      Exercises Other Exercises Other Exercises: deos well with flutter valve but needs max cues for use of IS and still unable to understand concept    General Comments        Pertinent Vitals/Pain Pain Assessment: No/denies pain    Home Living                      Prior Function            PT Goals (current goals can now be found in the care plan section) Acute Rehab PT Goals PT Goal Formulation: With patient Time For Goal Achievement: 06/19/19 Potential to Achieve Goals: Good Progress towards PT goals: Progressing toward goals    Frequency    Min 3X/week  PT Plan Current plan remains appropriate    Co-evaluation              AM-PAC PT "6 Clicks" Mobility   Outcome Measure  Help needed turning from your back to your side while in a flat bed without using bedrails?: None Help needed moving from lying on your back to sitting on the side of a flat bed without using bedrails?: None Help needed moving to and from a bed to a chair (including a wheelchair)?: None Help needed standing up from a chair using your arms (e.g., wheelchair or bedside chair)?: A Little Help needed to walk in hospital room?: A Little Help needed climbing 3-5 steps with a railing? : A Little 6 Click Score: 21    End of Session   Activity Tolerance: Patient  tolerated treatment well Patient left: in chair;with nursing/sitter in room Nurse Communication: Mobility status;Other (comment)(cognitive status) PT Visit Diagnosis: Other abnormalities of gait and mobility (R26.89)     Time: 7673-4193 PT Time Calculation (min) (ACUTE ONLY): 23 min  Charges:  $Gait Training: 8-22 mins $Self Care/Home Management: [place], PT    Delford Field 06/07/2019, 12:36 PM

## 2019-06-07 NOTE — Progress Notes (Signed)
[facility] PROGRESS NOTE    Progress Note  Ethan Pierce  HUD:[id number] DOB: [birth year] DOA: 06/03/2019 PCP: Pearson Grippe, MD     Brief Narrative:   Ethan Pierce is an 73 y.o. male past medical history of hypertension diabetes mellitus type 2 uncontrolled, presents to the ED for hypoxia in the setting of recent SARS-CoV-2 positivity on 05/30/2019.  On 05/31/2019 he returned to the ED as a code sepsis was started empirically on IV Levaquin and admitted to [facility] long hospital however left AGAINST MEDICAL ADVICE.  Blood cultures were done on 05/31/2019 that are negative till date. He returned on 06/01/2019 he was noted to be Covid positive but clinically stable and discharged home on Levaquin.  He presented again on 06/03/2019 with increased dyspnea on exertion and lightheadedness.  He reports a fall scraping his left knee but denies any loss of consciousness, his chest x-ray showed bilateral infiltrates.  CT of the head was done that showed no acute findings. Due to his lightheadedness and dyspnea on exertion he was admitted to [facility].  Assessment/Plan:   Acute respiratory failure with hypoxia due to Pneumonia due to COVID-19 virus: Is currently satting greater than 100% on room air. He was started on IV remdesivir she will continue for 5 days, today is his last day of IV remdesivir will monitor for an additional 24 hours.  Empiric antibiotic were discontinued. He will continue oral dexamethasone.  Essential hypertension: Pressure seems to be fairly controlled continue metoprolol lisinopril and hydrochlorothiazide.  Diabetes mellitus type 2 uncontrolled: With an A1c of 8.1. Continue Lantus plus sliding scale.  History of falls: PT OT has been consulted.  Likely secondary to generalized weakness in the setting of COVID-19 viral infection possibly hypovolemia. CT of the head was done that showed no acute findings. KVO IV fluids.      DVT prophylaxis: lovenox Family  Communication:None Disposition Plan/Barrier to D/C: Hopefully home in the morning we will monitor for 24 hours. Code Status:     Code Status Orders  (From admission, onward)         Start     Ordered   06/03/19 2114  Full code  Continuous     06/03/19 2115        Code Status History    This patient has a current code status but no historical code status.   Advance Care Planning Activity        IV Access:    Peripheral IV   Procedures and diagnostic studies:   No results found.   Medical Consultants:    None.  Anti-Infectives:   None  Subjective:    Ethan Pierce feels better this morning.  Objective:    Vitals:   06/06/19 1624 06/06/19 1948 06/07/19 0333 06/07/19 0726  BP: (!) 116/56 (!) 157/69 (!) 113/50 (!) 126/58  Pulse: 76 (!) 110 80 94  Resp:  18 16 16   Temp: 98.5 F (36.9 C) 98.6 F (37 C) 98.7 F (37.1 C) 99.5 F (37.5 C)  TempSrc: Axillary Oral Oral Oral  SpO2: 100%  100% 98%  Weight:      Height:       SpO2: 98 % O2 Flow Rate (L/min): 2 L/min   Intake/Output Summary (Last 24 hours) at 06/07/2019 0751 Last data filed at 06/07/2019 0500 Gross per 24 hour  Intake 1355.97 ml  Output 375 ml  Net 980.97 ml   Filed Weights   06/03/19 2355 06/04/19 1600  Weight: 78 kg  75.4 kg    Exam: General exam: In no acute distress. Respiratory system: Good air movement and clear to auscultation. Cardiovascular system: Regular rate and rhythm with positive S1-S2 no murmurs rubs gallops. Gastrointestinal system: Abdomen is nondistended, soft and nontender.  Central nervous system: Alert and oriented. No focal neurological deficits. Extremities: No pedal edema. Skin: No rashes, lesions or ulcers Psychiatry: Judgement and insight appear normal. Mood & affect appropriate.    Data Reviewed:    Labs: Basic Metabolic Panel: Recent Labs  Lab 06/03/19 1227 06/04/19 0405 06/05/19 0526 06/06/19 0845 06/07/19 0500  NA 135 139 140 143  143  K 3.5 4.3 3.4* 3.4* 3.1*  CL 98 104 103 106 104  CO2 25 26 21* 26 27  GLUCOSE 154* 220* 232* 126* 90  BUN 15 15 25* 36* 37*  CREATININE 1.08 0.95 1.13 1.15 1.01  CALCIUM 8.2* 8.1* 8.7* 8.5* 8.2*  MG  --  2.2 2.3 2.3 2.0  PHOS  --  3.1 3.9 4.2 3.6   GFR Estimated Creatinine Clearance: 63 mL/min (by C-G formula based on SCr of 1.01 mg/dL). Liver Function Tests: Recent Labs  Lab 05/31/19 2005 06/04/19 0405 06/05/19 0526 06/06/19 0845 06/07/19 0500  AST 33 33 51* 62* 59*  ALT 16 19 29  36 43  ALKPHOS 56 50 64 61 63  BILITOT 0.4 0.5 0.7 0.8 0.9  PROT 7.4 6.9 7.6 7.2 6.9  ALBUMIN 3.0* 2.4* 2.9* 2.7* 2.7*   No results for input(s): LIPASE, AMYLASE in the last 168 hours. No results for input(s): AMMONIA in the last 168 hours. Coagulation profile Recent Labs  Lab 05/31/19 2005  INR 1.1   COVID-19 Labs  Recent Labs    06/05/19 0526 06/06/19 0845 06/07/19 0500  DDIMER 1.64* 5.51* 2.82*  FERRITIN 804* 722* 626*  LDH  --  449* 406*  CRP 17.1* 8.2* 6.6*    Lab Results  Component Value Date   SARSCOV2NAA Detected (A) 05/30/2019    CBC: Recent Labs  Lab 06/01/19 2330 06/03/19 1227 06/04/19 0405 06/05/19 0526 06/06/19 0845 06/07/19 0500  WBC 9.5 12.2* 8.3 22.0* 21.1* 15.2*  NEUTROABS 8.3*  --  7.3 19.3* 18.3* 12.0*  HGB 11.8* 11.4* 10.5* 12.0* 12.2* 11.8*  HCT 35.3* 35.3* 32.9* 36.5* 37.6* 36.1*  MCV 92.7 93.9 95.1 92.6 93.3 93.3  PLT 204 284 304 432* 420* 412*   Cardiac Enzymes: No results for input(s): CKTOTAL, CKMB, CKMBINDEX, TROPONINI in the last 168 hours. BNP (last 3 results) No results for input(s): PROBNP in the last 8760 hours. CBG: Recent Labs  Lab 06/06/19 1202 06/06/19 1623 06/06/19 1951 06/06/19 2337 06/07/19 0724  GLUCAP 206* 180* 225* 148* 115*   D-Dimer: Recent Labs    06/06/19 0845 06/07/19 0500  DDIMER 5.51* 2.82*   Hgb A1c: No results for input(s): HGBA1C in the last 72 hours. Lipid Profile: No results for input(s):  CHOL, HDL, LDLCALC, TRIG, CHOLHDL, LDLDIRECT in the last 72 hours. Thyroid function studies: No results for input(s): TSH, T4TOTAL, T3FREE, THYROIDAB in the last 72 hours.  Invalid input(s): FREET3 Anemia work up: Recent Labs    06/06/19 0845 06/07/19 0500  FERRITIN 722* 626*   Sepsis Labs: Recent Labs  Lab 05/31/19 2005 06/01/19 2330  06/03/19 1230 06/04/19 0405 06/05/19 0526 06/06/19 0845 06/07/19 0500  PROCALCITON  --  0.18  --  0.24  --   --   --   --   WBC 7.3 9.5   < >  --  8.3 22.0* 21.1* 15.2*  LATICACIDVEN 1.5  --   --  1.9  --   --   --   --    < > = values in this interval not displayed.   Microbiology Recent Results (from the past 240 hour(s))  Novel Coronavirus, NAA ([facility])     Status: Abnormal   Collection Time: 05/30/19 12:10 PM   Specimen: Nasopharyngeal(NP) swabs in vial transport medium   NASOPHARYNGE  TESTING  Result Value Ref Range Status   SARS-CoV-2, NAA Detected (A) Not Detected Final    Comment: This nucleic acid amplification test was developed and its performance characteristics determined by [employer]. Nucleic acid amplification tests include PCR and TMA. This test has not been FDA cleared or approved. This test has been authorized by FDA under an Emergency Use Authorization (EUA). This test is only authorized for the duration of time the declaration that circumstances exist justifying the authorization of the emergency use of in vitro diagnostic tests for detection of SARS-CoV-2 virus and/or diagnosis of COVID-19 infection under section 564(b)(1) of the Act, 21 U.S.C. 161WRU-0(A) (1), unless the authorization is terminated or revoked sooner. When diagnostic testing is negative, the possibility of a false negative result should be considered in the context of a patient's recent exposures and the presence of clinical signs and symptoms consistent with COVID-19. An individual without symptoms of COVID-19 and who is not shedding  SARS-CoV-2 virus would  expect to have a negative (not detected) result in this assay.   Blood Culture (routine x 2)     Status: None   Collection Time: 05/31/19  7:45 PM   Specimen: BLOOD  Result Value Ref Range Status   Specimen Description   Final    BLOOD RIGHT ANTECUBITAL Performed at [facility], [address] [place], [place] [address]    Special Requests   Final    BOTTLES DRAWN AEROBIC AND ANAEROBIC Blood Culture adequate volume Performed at [facility], [address] [place], [place] [address]    Culture   Final    NO GROWTH 5 DAYS Performed at [facility], [address] [place], [place] [address]    Report Status 06/05/2019 FINAL  Final  Blood Culture (routine x 2)     Status: None   Collection Time: 05/31/19  8:00 PM   Specimen: BLOOD RIGHT FOREARM  Result Value Ref Range Status   Specimen Description   Final    BLOOD RIGHT FOREARM Performed at [facility], [address], [place], [place] [address]    Special Requests   Final    BOTTLES DRAWN AEROBIC AND ANAEROBIC Blood Culture adequate volume Performed at [facility], [address] [place], [place] [address]    Culture   Final    NO GROWTH 5 DAYS Performed at [facility], [address], [place], [place] [address]    Report Status 06/05/2019 FINAL  Final  Blood Culture (routine x 2)     Status: None (Preliminary result)   Collection Time: 06/03/19 12:30 PM   Specimen: BLOOD  Result Value Ref Range Status   Specimen Description   Final    BLOOD LEFT ANTECUBITAL Performed at [facility], [address], [place], [place] [address]    Special Requests   Final    BOTTLES DRAWN AEROBIC AND ANAEROBIC Blood Culture adequate volume Performed at [facility], [address] [place], [place] [address]    Culture   Final    NO GROWTH 3 DAYS Performed at [facility], [address] [place], [place]  [address]    Report Status PENDING  Incomplete  Blood Culture (routine x 2)     Status: None (Preliminary result)   Collection Time: 06/03/19 12:35 PM   Specimen: BLOOD LEFT FOREARM  Result Value Ref Range Status   Specimen Description   Final    BLOOD LEFT FOREARM Performed at [facility], [address] [place], [place] [address]    Special Requests   Final    BOTTLES DRAWN AEROBIC AND ANAEROBIC Blood Culture results may not be optimal due to an excessive volume of blood received in culture bottles Performed at [facility], [address] [place], [place] [address]    Culture   Final    NO GROWTH 3 DAYS Performed at [facility], [address] [place], [place] [address]    Report Status PENDING  Incomplete     Medications:   . albuterol  2 puff Inhalation Q6H  . dexamethasone  6 mg Oral Daily  . enoxaparin (LOVENOX) injection  40 mg Subcutaneous QHS  . haloperidol lactate  2 mg Intravenous Q6H  . lisinopril  20 mg Oral Daily   And  . hydrochlorothiazide  25 mg Oral Daily  . insulin aspart  0-15 Units Subcutaneous Q4H  . insulin glargine  8 Units Subcutaneous Daily  . latanoprost  1 drop Both Eyes QHS  . metoprolol tartrate  25 mg Oral BID  . vitamin C  500 mg Oral Daily  . zinc sulfate  220 mg Oral Daily   Continuous Infusions: . remdesivir 100 mg in NS 250 mL Stopped (06/06/19 1545)      LOS: 4 days   Marinda ElkAbraham Feliz Ortiz  [facility]  06/07/2019, 7:51 AM

## 2019-06-07 NOTE — Progress Notes (Deleted)
Synopsis: Referred in November 2020 for COVID-19 by Pearson Grippe, MD.  Subjective:   PATIENT ID: Ethan Pierce GENDER: male DOB: 09-18-1945, MRN: [id number]  No chief complaint on file.   HPI   Hospitalized for COVID-19 pneumonia currently  Past Medical History:  Diagnosis Date   Bronchitis    Diabetes mellitus without complication (HCC)    GERD (gastroesophageal reflux disease)    Hypertension      Family History  Problem Relation Age of Onset   Diabetes Mother    GER disease Mother    Cancer Father        pancreas   Diabetes Sister    Diabetes Brother      No past surgical history on file.  Social History   Socioeconomic History   Marital status: Married    Spouse name: Not on file   Number of children: Not on file   Years of education: Not on file   Highest education level: Not on file  Occupational History   Not on file  Social Needs   Financial resource strain: Not on file   Food insecurity    Worry: Not on file    Inability: Not on file   Transportation needs    Medical: Not on file    Non-medical: Not on file  Tobacco Use   Smoking status: Never Smoker   Smokeless tobacco: Never Used  Substance and Sexual Activity   Alcohol use: No   Drug use: No   Sexual activity: Yes  Lifestyle   Physical activity    Days per week: Not on file    Minutes per session: Not on file   Stress: Not on file  Relationships   Social connections    Talks on phone: Not on file    Gets together: Not on file    Attends religious service: Not on file    Active member of club or organization: Not on file    Attends meetings of clubs or organizations: Not on file    Relationship status: Not on file   Intimate partner violence    Fear of current or ex partner: Not on file    Emotionally abused: Not on file    Physically abused: Not on file    Forced sexual activity: Not on file  Other Topics Concern   Not on file  Social History  Narrative   Not on file     Allergies  Allergen Reactions   Penicillins Anaphylaxis    Did it involve swelling of the face/tongue/throat, SOB, or low BP? Yes Did it involve sudden or severe rash/hives, skin peeling, or any reaction on the inside of your mouth or nose? No Did you need to seek medical attention at a hospital or doctor's office? No When did it last happen? If all above answers are NO, may proceed with cephalosporin use.    Trazodone And Nefazodone Other (See Comments)    Confusion, agitation, and hyperalert   Ativan [Lorazepam] Other (See Comments)    agitation    Decongestant [Pseudoephedrine Hcl Er] Other (See Comments)    Elevated blood pressure    Talwin [Pentazocine] Other (See Comments)    Elevated blood pressure.      There is no immunization history on file for this patient.  Facility-Administered Medications Prior to Visit  Medication Dose Route Frequency Provider Last Rate Last Dose   acetaminophen (TYLENOL) tablet 650 mg  650 mg Oral Q6H PRN Charlsie Quest, MD  albuterol (VENTOLIN HFA) 108 (90 Base) MCG/ACT inhaler 2 puff  2 puff Inhalation Q6H Zada Finders R, MD   2 puff at 06/06/19 2010   chlorpheniramine-HYDROcodone (TUSSIONEX) 10-8 MG/5ML suspension 5 mL  5 mL Oral Q12H PRN Zada Finders R, MD   5 mL at 06/07/19 0215   dexamethasone (DECADRON) tablet 6 mg  6 mg Oral Daily Allie Bossier, MD   6 mg at 06/06/19 0804   enoxaparin (LOVENOX) injection 40 mg  40 mg Subcutaneous QHS Zada Finders R, MD   40 mg at 06/06/19 2009   guaiFENesin-dextromethorphan (ROBITUSSIN DM) 100-10 MG/5ML syrup 10 mL  10 mL Oral Q4H PRN Lenore Cordia, MD       haloperidol lactate (HALDOL) injection 2 mg  2 mg Intravenous Q6H PRN Opyd, Ilene Qua, MD       haloperidol lactate (HALDOL) injection 2 mg  2 mg Intravenous Q6H Allie Bossier, MD   2 mg at 06/07/19 0324   lisinopril (ZESTRIL) tablet 20 mg  20 mg Oral Daily Zada Finders R, MD   20 mg at  06/06/19 8338   And   hydrochlorothiazide (HYDRODIURIL) tablet 25 mg  25 mg Oral Daily Zada Finders R, MD   25 mg at 06/06/19 0804   insulin aspart (novoLOG) injection 0-15 Units  0-15 Units Subcutaneous Q4H Allie Bossier, MD   0 Units at 06/07/19 0327   insulin glargine (LANTUS) injection 8 Units  8 Units Subcutaneous Daily Allie Bossier, MD   8 Units at 06/06/19 0805   latanoprost (XALATAN) 0.005 % ophthalmic solution 1 drop  1 drop Both Eyes QHS Zada Finders R, MD   1 drop at 06/06/19 2009   metoprolol tartrate (LOPRESSOR) tablet 25 mg  25 mg Oral BID Allie Bossier, MD   25 mg at 06/06/19 2010   ondansetron (ZOFRAN) tablet 4 mg  4 mg Oral Q6H PRN Lenore Cordia, MD       Or   ondansetron (ZOFRAN) injection 4 mg  4 mg Intravenous Q6H PRN Lenore Cordia, MD       remdesivir 100 mg in sodium chloride 0.9 % 250 mL IVPB  100 mg Intravenous Q24H Eugenie Filler, MD   Stopped at 06/06/19 1545   senna-docusate (Senokot-S) tablet 1 tablet  1 tablet Oral QHS PRN Lenore Cordia, MD       vitamin C (ASCORBIC ACID) tablet 500 mg  500 mg Oral Daily Zada Finders R, MD   500 mg at 06/06/19 2505   zinc sulfate capsule 220 mg  220 mg Oral Daily Lenore Cordia, MD   220 mg at 06/06/19 3976   Outpatient Medications Prior to Visit  Medication Sig Dispense Refill   acetaminophen (TYLENOL) 160 MG/5ML elixir Take 15.6 mLs (500 mg total) by mouth every 4 (four) hours as needed for fever. 473 mL 2   bimatoprost (LUMIGAN) 0.03 % ophthalmic solution Place 1 drop into both eyes at bedtime.     lisinopril-hydrochlorothiazide (ZESTORETIC) 20-25 MG tablet Take 1 tablet by mouth daily.     OVER THE COUNTER MEDICATION Take 1 packet by mouth daily.     Polyethyl Glycol-Propyl Glycol (SYSTANE OP) Apply 1 drop to eye daily as needed (dry eyes).      ROS   Objective:  There were no vitals filed for this visit.   on *** LPM *** RA BMI Readings from Last 3 Encounters:  06/04/19 25.27 kg/m    06/03/19  26.15 kg/m  05/31/19 26.15 kg/m   Wt Readings from Last 3 Encounters:  06/04/19 166 lb 3.2 oz (75.4 kg)  06/03/19 171 lb 15.3 oz (78 kg)  05/31/19 172 lb (78 kg)    Physical Exam   CBC    Component Value Date/Time   WBC 15.2 (H) 06/07/2019 0500   RBC 3.87 (L) 06/07/2019 0500   HGB 11.8 (L) 06/07/2019 0500   HCT 36.1 (L) 06/07/2019 0500   PLT 412 (H) 06/07/2019 0500   MCV 93.3 06/07/2019 0500   MCH 30.5 06/07/2019 0500   MCHC 32.7 06/07/2019 0500   RDW 12.8 06/07/2019 0500   LYMPHSABS 1.6 06/07/2019 0500   MONOABS 1.3 (H) 06/07/2019 0500   EOSABS 0.0 06/07/2019 0500   BASOSABS 0.0 06/07/2019 0500    CHEMISTRY Recent Labs  Lab 06/03/19 1227 06/04/19 0405 06/05/19 0526 06/06/19 0845 06/07/19 0500  NA 135 139 140 143 143  K 3.5 4.3 3.4* 3.4* 3.1*  CL 98 104 103 106 104  CO2 25 26 21* 26 27  GLUCOSE 154* 220* 232* 126* 90  BUN 15 15 25* 36* 37*  CREATININE 1.08 0.95 1.13 1.15 1.01  CALCIUM 8.2* 8.1* 8.7* 8.5* 8.2*  MG  --  2.2 2.3 2.3 2.0  PHOS  --  3.1 3.9 4.2 3.6   Estimated Creatinine Clearance: 63 mL/min (by C-G formula based on SCr of 1.01 mg/dL). ***  Chest Imaging- films reviewed: ***  Pulmonary Functions Testing Results: No flowsheet data found.  Pathology: ***  Echocardiogram: ***  Heart Catheterization: ***    Assessment & Plan:   No diagnosis found.   No current facility-administered medications for this visit.  No current outpatient medications on file.  Facility-Administered Medications Ordered in Other Visits:    acetaminophen (TYLENOL) tablet 650 mg, 650 mg, Oral, Q6H PRN, Allena KatzPatel, Vishal R, MD   albuterol (VENTOLIN HFA) 108 (90 Base) MCG/ACT inhaler 2 puff, 2 puff, Inhalation, Q6H, Darreld McleanPatel, Vishal R, MD, 2 puff at 06/06/19 2010   chlorpheniramine-HYDROcodone (TUSSIONEX) 10-8 MG/5ML suspension 5 mL, 5 mL, Oral, Q12H PRN, Darreld McleanPatel, Vishal R, MD, 5 mL at 06/07/19 0215   dexamethasone (DECADRON) tablet 6 mg, 6 mg, Oral,  Daily, Drema DallasWoods, Curtis J, MD, 6 mg at 06/06/19 0804   enoxaparin (LOVENOX) injection 40 mg, 40 mg, Subcutaneous, QHS, Patel, Vishal R, MD, 40 mg at 06/06/19 2009   guaiFENesin-dextromethorphan (ROBITUSSIN DM) 100-10 MG/5ML syrup 10 mL, 10 mL, Oral, Q4H PRN, Allena KatzPatel, Vishal R, MD   haloperidol lactate (HALDOL) injection 2 mg, 2 mg, Intravenous, Q6H PRN, Opyd, Lavone Neriimothy S, MD   haloperidol lactate (HALDOL) injection 2 mg, 2 mg, Intravenous, Q6H, Drema DallasWoods, Curtis J, MD, 2 mg at 06/07/19 0324   lisinopril (ZESTRIL) tablet 20 mg, 20 mg, Oral, Daily, 20 mg at 06/06/19 0804 **AND** hydrochlorothiazide (HYDRODIURIL) tablet 25 mg, 25 mg, Oral, Daily, Allena KatzPatel, Vishal R, MD, 25 mg at 06/06/19 0804   insulin aspart (novoLOG) injection 0-15 Units, 0-15 Units, Subcutaneous, Q4H, Drema DallasWoods, Curtis J, MD, 0 Units at 06/07/19 0327   insulin glargine (LANTUS) injection 8 Units, 8 Units, Subcutaneous, Daily, Drema DallasWoods, Curtis J, MD, 8 Units at 06/06/19 0805   latanoprost (XALATAN) 0.005 % ophthalmic solution 1 drop, 1 drop, Both Eyes, QHS, Patel, Vishal R, MD, 1 drop at 06/06/19 2009   metoprolol tartrate (LOPRESSOR) tablet 25 mg, 25 mg, Oral, BID, Drema DallasWoods, Curtis J, MD, 25 mg at 06/06/19 2010   ondansetron (ZOFRAN) tablet 4 mg, 4 mg, Oral, Q6H PRN **OR**  ondansetron (ZOFRAN) injection 4 mg, 4 mg, Intravenous, Q6H PRN, Charlsie Quest, MD   remdesivir 100 mg in sodium chloride 0.9 % 250 mL IVPB, 100 mg, Intravenous, Q24H, Rodolph Bong, MD, Stopped at 06/06/19 1545   senna-docusate (Senokot-S) tablet 1 tablet, 1 tablet, Oral, QHS PRN, Charlsie Quest, MD   vitamin C (ASCORBIC ACID) tablet 500 mg, 500 mg, Oral, Daily, Darreld Mclean R, MD, 500 mg at 06/06/19 0804   zinc sulfate capsule 220 mg, 220 mg, Oral, Daily, Darreld Mclean R, MD, 220 mg at 06/06/19 0804   Steffanie Dunn, DO [facility] 06/07/2019 7:26 AM

## 2019-06-07 NOTE — Progress Notes (Signed)
During morning rounds, MD Ethan Pierce stated IVC and sitter to be discontinued for patient.  Sitter removed from room. Patient stable, no signs of harm.

## 2019-06-08 ENCOUNTER — Other Ambulatory Visit: Payer: Self-pay | Admitting: *Deleted

## 2019-06-08 LAB — CBC WITH DIFFERENTIAL/PLATELET
Abs Immature Granulocytes: 0.22 10*3/uL — ABNORMAL HIGH (ref 0.00–0.07)
Basophils Absolute: 0 10*3/uL (ref 0.0–0.1)
Basophils Relative: 0 %
Eosinophils Absolute: 0 10*3/uL (ref 0.0–0.5)
Eosinophils Relative: 0 %
HCT: 32.2 % — ABNORMAL LOW (ref 39.0–52.0)
Hemoglobin: 10.6 g/dL — ABNORMAL LOW (ref 13.0–17.0)
Immature Granulocytes: 2 %
Lymphocytes Relative: 11 %
Lymphs Abs: 1.5 10*3/uL (ref 0.7–4.0)
MCH: 30.4 pg (ref 26.0–34.0)
MCHC: 32.9 g/dL (ref 30.0–36.0)
MCV: 92.3 fL (ref 80.0–100.0)
Monocytes Absolute: 1.1 10*3/uL — ABNORMAL HIGH (ref 0.1–1.0)
Monocytes Relative: 8 %
Neutro Abs: 10.5 10*3/uL — ABNORMAL HIGH (ref 1.7–7.7)
Neutrophils Relative %: 79 %
Platelets: 371 10*3/uL (ref 150–400)
RBC: 3.49 MIL/uL — ABNORMAL LOW (ref 4.22–5.81)
RDW: 12.7 % (ref 11.5–15.5)
WBC: 13.5 10*3/uL — ABNORMAL HIGH (ref 4.0–10.5)
nRBC: 0 % (ref 0.0–0.2)

## 2019-06-08 LAB — CULTURE, BLOOD (ROUTINE X 2)
Culture: NO GROWTH
Culture: NO GROWTH
Special Requests: ADEQUATE

## 2019-06-08 LAB — COMPREHENSIVE METABOLIC PANEL
ALT: 31 U/L (ref 0–44)
AST: 34 U/L (ref 15–41)
Albumin: 2.3 g/dL — ABNORMAL LOW (ref 3.5–5.0)
Alkaline Phosphatase: 52 U/L (ref 38–126)
Anion gap: 9 (ref 5–15)
BUN: 30 mg/dL — ABNORMAL HIGH (ref 8–23)
CO2: 29 mmol/L (ref 22–32)
Calcium: 7.9 mg/dL — ABNORMAL LOW (ref 8.9–10.3)
Chloride: 102 mmol/L (ref 98–111)
Creatinine, Ser: 0.85 mg/dL (ref 0.61–1.24)
GFR calc Af Amer: >60 mL/min (ref >60)
GFR calc non Af Amer: >60 mL/min (ref >60)
Glucose, Bld: 161 mg/dL — ABNORMAL HIGH (ref 70–99)
Potassium: 3.1 mmol/L — ABNORMAL LOW (ref 3.5–5.1)
Sodium: 140 mmol/L (ref 135–145)
Total Bilirubin: 0.6 mg/dL (ref 0.3–1.2)
Total Protein: 5.7 g/dL — ABNORMAL LOW (ref 6.5–8.1)

## 2019-06-08 LAB — GLUCOSE, CAPILLARY
Glucose-Capillary: 111 mg/dL — ABNORMAL HIGH (ref 70–99)
Glucose-Capillary: 140 mg/dL — ABNORMAL HIGH (ref 70–99)
Glucose-Capillary: 176 mg/dL — ABNORMAL HIGH (ref 70–99)
Glucose-Capillary: 281 mg/dL — ABNORMAL HIGH (ref 70–99)
Glucose-Capillary: 293 mg/dL — ABNORMAL HIGH (ref 70–99)
Glucose-Capillary: 46 mg/dL — ABNORMAL LOW (ref 70–99)
Glucose-Capillary: 85 mg/dL (ref 70–99)

## 2019-06-08 LAB — D-DIMER, QUANTITATIVE: D-Dimer, Quant: 1.78 ug/mL-FEU — ABNORMAL HIGH (ref 0.00–0.50)

## 2019-06-08 LAB — FERRITIN: Ferritin: 462 ng/mL — ABNORMAL HIGH (ref 24–336)

## 2019-06-08 LAB — LACTATE DEHYDROGENASE: LDH: 316 U/L — ABNORMAL HIGH (ref 98–192)

## 2019-06-08 LAB — MAGNESIUM: Magnesium: 2.1 mg/dL (ref 1.7–2.4)

## 2019-06-08 LAB — C-REACTIVE PROTEIN: CRP: 7 mg/dL — ABNORMAL HIGH (ref <1.0)

## 2019-06-08 LAB — PHOSPHORUS: Phosphorus: 3.2 mg/dL (ref 2.5–4.6)

## 2019-06-08 MED ORDER — METOPROLOL TARTRATE 25 MG PO TABS: 25.0000 mg | ORAL_TABLET | Freq: Two times a day (BID) | ORAL | 3 refills | Status: DC

## 2019-06-08 NOTE — Discharge Instructions (Signed)
Person Under Monitoring Name: Ethan Pierce  Location: [address] [place] [address]   Infection Prevention Recommendations for Individuals Confirmed to have, or Being Evaluated for, 2019 Novel Coronavirus (COVID-19) Infection Who Receive Care at Home  Individuals who are confirmed to have, or are being evaluated for, COVID-19 should follow the prevention steps below until a healthcare provider or local or state health department says they can return to normal activities.  Stay home except to get medical care You should restrict activities outside your home, except for getting medical care. Do not go to work, school, or public areas, and do not use public transportation or taxis.  Call ahead before visiting your doctor Before your medical appointment, call the healthcare provider and tell them that you have, or are being evaluated for, COVID-19 infection. This will help the healthcare providers office take steps to keep other people from getting infected. Ask your healthcare provider to call the local or state health department.  Monitor your symptoms Seek prompt medical attention if your illness is worsening (e.g., difficulty breathing). Before going to your medical appointment, call the healthcare provider and tell them that you have, or are being evaluated for, COVID-19 infection. Ask your healthcare provider to call the local or state health department.  Wear a facemask You should wear a facemask that covers your nose and mouth when you are in the same room with other people and when you visit a healthcare provider. People who live with or visit you should also wear a facemask while they are in the same room with you.  Separate yourself from other people in your home As much as possible, you should stay in a different room from other people in your home. Also, you should use a separate bathroom, if available.  Avoid sharing household items You should not  share dishes, drinking glasses, cups, eating utensils, towels, bedding, or other items with other people in your home. After using these items, you should wash them thoroughly with soap and water.  Cover your coughs and sneezes Cover your mouth and nose with a tissue when you cough or sneeze, or you can cough or sneeze into your sleeve. Throw used tissues in a lined trash can, and immediately wash your hands with soap and water for at least 20 seconds or use an alcohol-based hand rub.  Wash your [facility] your hands often and thoroughly with soap and water for at least 20 seconds. You can use an alcohol-based hand sanitizer if soap and water are not available and if your hands are not visibly dirty. Avoid touching your eyes, nose, and mouth with unwashed hands.   Prevention Steps for Caregivers and Household Members of Individuals Confirmed to have, or Being Evaluated for, COVID-19 Infection Being Cared for in the Home  If you live with, or provide care at home for, a person confirmed to have, or being evaluated for, COVID-19 infection please follow these guidelines to prevent infection:  Follow healthcare providers instructions Make sure that you understand and can help the patient follow any healthcare provider instructions for all care.  Provide for the patients basic needs You should help the patient with basic needs in the home and provide support for getting groceries, prescriptions, and other personal needs.  Monitor the patients symptoms If they are getting sicker, call his or her medical provider and tell them that the patient has, or is being evaluated for, COVID-19 infection. This will help the healthcare providers  office take steps to keep other people from getting infected. Ask the healthcare provider to call the local or state health department.  Limit the number of people who have contact with the patient  If possible, have only one caregiver for the  patient.  Other household members should stay in another home or place of residence. If this is not possible, they should stay  in another room, or be separated from the patient as much as possible. Use a separate bathroom, if available.  Restrict visitors who do not have an essential need to be in the home.  Keep older adults, very young children, and other sick people away from the patient Keep older adults, very young children, and those who have compromised immune systems or chronic health conditions away from the patient. This includes people with chronic heart, lung, or kidney conditions, diabetes, and cancer.  Ensure good ventilation Make sure that shared spaces in the home have good air flow, such as from an air conditioner or an opened window, weather permitting.  Wash your hands often  Wash your hands often and thoroughly with soap and water for at least 20 seconds. You can use an alcohol based hand sanitizer if soap and water are not available and if your hands are not visibly dirty.  Avoid touching your eyes, nose, and mouth with unwashed hands.  Use disposable paper towels to dry your hands. If not available, use dedicated cloth towels and replace them when they become wet.  Wear a facemask and gloves  Wear a disposable facemask at all times in the room and gloves when you touch or have contact with the patients blood, body fluids, and/or secretions or excretions, such as sweat, saliva, sputum, nasal mucus, vomit, urine, or feces.  Ensure the mask fits over your nose and mouth tightly, and do not touch it during use.  Throw out disposable facemasks and gloves after using them. Do not reuse.  Wash your hands immediately after removing your facemask and gloves.  If your personal clothing becomes contaminated, carefully remove clothing and launder. Wash your hands after handling contaminated clothing.  Place all used disposable facemasks, gloves, and other waste in a lined  container before disposing them with other household waste.  Remove gloves and wash your hands immediately after handling these items.  Do not share dishes, glasses, or other household items with the patient  Avoid sharing household items. You should not share dishes, drinking glasses, cups, eating utensils, towels, bedding, or other items with a patient who is confirmed to have, or being evaluated for, COVID-19 infection.  After the person uses these items, you should wash them thoroughly with soap and water.  Wash laundry thoroughly  Immediately remove and wash clothes or bedding that have blood, body fluids, and/or secretions or excretions, such as sweat, saliva, sputum, nasal mucus, vomit, urine, or feces, on them.  Wear gloves when handling laundry from the patient.  Read and follow directions on labels of laundry or clothing items and detergent. In general, wash and dry with the warmest temperatures recommended on the label.  Clean all areas the individual has used often  Clean all touchable surfaces, such as counters, tabletops, doorknobs, bathroom fixtures, toilets, phones, keyboards, tablets, and bedside tables, every day. Also, clean any surfaces that may have blood, body fluids, and/or secretions or excretions on them.  Wear gloves when cleaning surfaces the patient has come in contact with.  Use a diluted bleach solution (e.g., dilute bleach with 1  part bleach and 10 parts water) or a household disinfectant with a label that says EPA-registered for coronaviruses. To make a bleach solution at home, add 1 tablespoon of bleach to 1 quart (4 cups) of water. For a larger supply, add  cup of bleach to 1 gallon (16 cups) of water.  Read labels of cleaning products and follow recommendations provided on product labels. Labels contain instructions for safe and effective use of the cleaning product including precautions you should take when applying the product, such as wearing gloves or  eye protection and making sure you have good ventilation during use of the product.  Remove gloves and wash hands immediately after cleaning.  Monitor yourself for signs and symptoms of illness Caregivers and household members are considered close contacts, should monitor their health, and will be asked to limit movement outside of the home to the extent possible. Follow the monitoring steps for close contacts listed on the symptom monitoring form.   ? If you have additional questions, contact your local health department or call the epidemiologist on call at [phone] (available 24/7). ? This guidance is subject to change. For the most up-to-date guidance from [facility], please refer to their website: YouBlogs.pl

## 2019-06-08 NOTE — Progress Notes (Signed)
Notified spouse of progress.  All questions were answered and this nurse's contact number shared for further communication.   

## 2019-06-08 NOTE — Consult Note (Signed)
   [facility] CM Inpatient Consult   06/08/2019  KREIG PARSON Sep 21, 1945 [id number]    Patient checked if potential [place] Management services are neededunder his [facility] plan with a 7 day and 30 day readmissions, and has 28% high risk score for unplanned readmission and hospitalizations.   Review of patient's medical record and MD brief narrative reveal as: Ethan Pierce is an 73 y.o. male past medical history of hypertension diabetes mellitus type 2 uncontrolled,     presented to the ED for hypoxia in the setting of recent SARS-CoV-2 positivity on 05/30/2019.  On 05/31/2019 he returned to the ED as a code sepsis was started empirically on IV Levaquin and admitted to [facility] long hospital however left [place].   He returned on 06/01/2019 and noted to be Covid positive but clinically stable and discharged home on Levaquin.      He presented again on 06/03/2019 with increased dyspnea on exertion and lightheadedness. He reports a fall scraping his left knee but denies any loss of consciousness, his chest x-ray showed bilateral infiltrates.     Patient was admitted with Acute respiratory failure with hypoxia due to Pneumonia due to COVID-19 virus.  Primary Care Provider isDr. [name], with [facility], listed to provide transition of care.  Attempts to call patient but no response and he had transitionedto home prior to speaking with him.  Plan: Will follow with EMMI Pneumonia calls to follow-up post discharge.   Please place a [facility] Care Management consult as appropriate and for questions, please call:   Edwena Felty A. Colonel Krauser, BSN, RN-BC [facility] Liaison Cell: [phone]

## 2019-06-08 NOTE — Care Management (Signed)
Attempted to coordinate any DC needs with patient.  Could not reach patient through room phone, could not leave VM on cell, wife's cell number is disconnected, left HIPPA compliant VM on home number. Will await callback.

## 2019-06-08 NOTE — Progress Notes (Signed)
Patient discharged to home, AVS reviewed with patient, pulic health contract signed and place in chart, patient was provided with prescriptions, and his IV was removed. Patient left the floor via wheelchair with staff member, and wife provided transportation.

## 2019-06-08 NOTE — Discharge Summary (Signed)
Physician Discharge Summary  Ethan Pierce ZOX:[id number] DOB: [birth year] DOA: 06/03/2019  PCP: Pearson Grippe, MD  Admit date: 06/03/2019 Discharge date: 06/08/2019  Admitted From: Home Disposition:  Home  Recommendations for Outpatient Follow-up:  1. Follow up with PCP in 1-2 weeks 2. Please obtain BMP/CBC in one week   Home Health:Yes Equipment/Devices:Rolling wqalker  Discharge Condition:Stable CODE STATUS:Full Diet recommendation: Heart Healthy   Brief/Interim Summary: 73 y.o. male past medical history of hypertension diabetes mellitus type 2 uncontrolled, presents to the ED for hypoxia in the setting of recent SARS-CoV-2 positivity on 05/30/2019.  On 05/31/2019 he returned to the ED as a code sepsis was started empirically on IV Levaquin and admitted to [facility] however left AGAINST MEDICAL ADVICE.  Blood cultures were done on 05/31/2019 that are negative till date. He returned on 06/01/2019 he was noted to be Covid positive but clinically stable and discharged home on Levaquin.  He presented again on 06/03/2019 with increased dyspnea on exertion and lightheadedness.  He reports a fall scraping his left knee but denies any loss of consciousness, his chest x-ray showed bilateral infiltrates.  CT of the head was done that showed no acute findings. Due to his lightheadedness and dyspnea on exertion he was admitted to [facility]  Discharge Diagnoses:  Principal Problem:   Pneumonia due to COVID-19 virus Active Problems:   Hypertension associated with diabetes (HCC)   Fall   Acute respiratory failure with hypoxia (HCC)   Diabetes mellitus type 2, uncontrolled, with complications (HCC)   Essential hypertension Acute respiratory failure with hypoxia due to the pneumonia due to COVID-19: He was treated with IV steroids and IV remdesivir for which she completed his treatment in house. He was weaned off oxygen, and he remained stable. Steroids were discontinued and saturations  remained greater than 98% on room air he was discharged in stable condition.  Essential hypertension: No changes made to his medication.  Diabetes mellitus type 2 uncontrolled: With an A1c of 8.1. He was covered with Lantus and long-acting insulin oral hypoglycemic agents were held on admission with good control of his blood glucose. He is placed back on his oral hypoglycemic agents will follow up with PCP and titrate his medications as needed.  History of falls: PT OT was consulted and recommended rolling walker and home health supervision. CT of the head was done that showed no acute findings.   Discharge Instructions  Discharge Instructions    Diet - low sodium heart healthy   Complete by: As directed    Increase activity slowly   Complete by: As directed      Allergies as of 06/08/2019      Reactions   Penicillins Anaphylaxis   Did it involve swelling of the face/tongue/throat, SOB, or low BP? Yes Did it involve sudden or severe rash/hives, skin peeling, or any reaction on the inside of your mouth or nose? No Did you need to seek medical attention at a hospital or doctor's office? No When did it last happen? If all above answers are "NO", may proceed with cephalosporin use.   Trazodone And Nefazodone Other (See Comments)   Confusion, agitation, and hyperalert   Ativan [lorazepam] Other (See Comments)   agitation    Decongestant [pseudoephedrine Hcl Er] Other (See Comments)   Elevated blood pressure    Talwin [pentazocine] Other (See Comments)   Elevated blood pressure.      Medication List    STOP taking these medications   OVER THE COUNTER  MEDICATION   SYSTANE OP     TAKE these medications   acetaminophen 160 MG/5ML elixir Commonly known as: TYLENOL Take 15.6 mLs (500 mg total) by mouth every 4 (four) hours as needed for fever.   bimatoprost 0.03 % ophthalmic solution Commonly known as: LUMIGAN Place 1 drop into both eyes at bedtime.    lisinopril-hydrochlorothiazide 20-25 MG tablet Commonly known as: ZESTORETIC Take 1 tablet by mouth daily.   metoprolol tartrate 25 MG tablet Commonly known as: LOPRESSOR Take 1 tablet (25 mg total) by mouth 2 (two) times daily.       Allergies  Allergen Reactions  . Penicillins Anaphylaxis    Did it involve swelling of the face/tongue/throat, SOB, or low BP? Yes Did it involve sudden or severe rash/hives, skin peeling, or any reaction on the inside of your mouth or nose? No Did you need to seek medical attention at a hospital or doctor's office? No When did it last happen? If all above answers are "NO", may proceed with cephalosporin use.   . Trazodone And Nefazodone Other (See Comments)    Confusion, agitation, and hyperalert  . Ativan [Lorazepam] Other (See Comments)    agitation   . Decongestant [Pseudoephedrine Hcl Er] Other (See Comments)    Elevated blood pressure   . Talwin [Pentazocine] Other (See Comments)    Elevated blood pressure.    Consultations:  None   Procedures/Studies: Dg Shoulder Right  Result Date: 05/29/2019 CLINICAL DATA:  Fall this morning with right shoulder pain. EXAM: RIGHT SHOULDER - 2+ VIEW COMPARISON:  Chest x-ray 2016. FINDINGS: Minimal degenerative change of the [facility] joint and glenohumeral joints. No evidence of acute fracture or dislocation. IMPRESSION: No acute findings. Mild degenerative changes. Electronically Signed   By: Elberta Fortis M.D.   On: 05/29/2019 08:39   Ct Head Wo Contrast  Result Date: 06/03/2019 CLINICAL DATA:  Head trauma EXAM: CT HEAD WITHOUT CONTRAST TECHNIQUE: Contiguous axial images were obtained from the base of the skull through the vertex without intravenous contrast. COMPARISON:  11/26/2013 FINDINGS: Brain: No evidence of acute infarction, hemorrhage, hydrocephalus, extra-axial collection or mass lesion/mass effect. Vascular: No hyperdense vessel or unexpected calcification. Skull: Normal. Negative for  fracture or focal lesion. Sinuses/Orbits: No acute finding. Other: None. IMPRESSION: No acute intracranial pathology. Electronically Signed   By: Lauralyn Primes M.D.   On: 06/03/2019 14:41   Mr Brain Wo Contrast (neuro Protocol)  Result Date: 05/29/2019 CLINICAL DATA:  Fever.  Near syncopal episode. EXAM: MRI HEAD WITHOUT CONTRAST TECHNIQUE: Multiplanar, multiecho pulse sequences of the brain and surrounding structures were obtained without intravenous contrast. COMPARISON:  Head CT 11/26/2013 FINDINGS: Brain: Only diffusion imaging was achieved because of patient claustrophobia. Sequences normal without evidence of acute small or large vessel infarction, mass lesion, hemorrhage, hydrocephalus or extra-axial collection. No sign of advanced atrophy. Vascular: Flow present in the major vessels at the base of the brain. Skull and upper cervical spine: No abnormality seen. Sinuses/Orbits: Sinuses clear.  Orbits negative. Other: None IMPRESSION: Only diffusion imaging is obtained due to patient intolerance. The diffusion scan is normal. No acute or reversible pathology identified. Electronically Signed   By: Paulina Fusi M.D.   On: 05/29/2019 12:32   Dg Chest Port 1 View  Result Date: 06/03/2019 CLINICAL DATA:  Shortness of breath EXAM: PORTABLE CHEST 1 VIEW COMPARISON:  06/01/2019 FINDINGS: The heart size and mediastinal contours are within normal limits. There is new, subtle heterogeneous airspace opacity of the bilateral lung bases, most  conspicuous in the right lung base. The visualized skeletal structures are unremarkable. IMPRESSION: There is new, subtle heterogeneous airspace opacity of the bilateral lung bases, most conspicuous in the right lung base, concerning for infection. Electronically Signed   By: Eddie Candle M.D.   On: 06/03/2019 13:12   Dg Chest Port 1 View  Result Date: 06/02/2019 CLINICAL DATA:  History of COVID-19 positivity with shortness of breath and fevers EXAM: PORTABLE CHEST 1 VIEW  COMPARISON:  05/31/2019 FINDINGS: Cardiac shadow is stable. The previously seen right basilar opacity is again noted. No new focal infiltrate is seen. No bony abnormality is noted. IMPRESSION: Stable right basilar opacity. Electronically Signed   By: Inez Catalina M.D.   On: 06/02/2019 00:10   Dg Chest Port 1 View  Result Date: 05/31/2019 CLINICAL DATA:  Fevers with headaches EXAM: PORTABLE CHEST 1 VIEW COMPARISON:  01/12/2015 FINDINGS: Cardiac shadow is stable. The lungs are well aerated bilaterally. Mild increased density is noted in the right lung base consistent with early infiltrate. No sizable effusion is noted. Degenerative changes of the thoracic spine are again seen. IMPRESSION: Mild early infiltrate in the medial right lung base. Electronically Signed   By: Inez Catalina M.D.   On: 05/31/2019 20:11     Subjective: No complaints feels great.  Discharge Exam: Vitals:   06/07/19 1918 06/08/19 0400  BP: 137/62 (!) 159/68  Pulse: (!) 105 93  Resp: 18 18  Temp: 97.9 F (36.6 C) 97.6 F (36.4 C)  SpO2:  98%   Vitals:   06/07/19 0726 06/07/19 1648 06/07/19 1918 06/08/19 0400  BP: (!) 126/58 129/63 137/62 (!) 159/68  Pulse: 94 89 (!) 105 93  Resp: 16 20 18 18   Temp: 99.5 F (37.5 C) 98.9 F (37.2 C) 97.9 F (36.6 C) 97.6 F (36.4 C)  TempSrc: Oral Oral Oral Oral  SpO2: 98% 96%  98%  Weight:      Height:        General: Pt is alert, awake, not in acute distress Cardiovascular: RRR, S1/S2 +, no rubs, no gallops Respiratory: CTA bilaterally, no wheezing, no rhonchi Abdominal: Soft, NT, ND, bowel sounds + Extremities: no edema, no cyanosis    The results of significant diagnostics from this hospitalization (including imaging, microbiology, ancillary and laboratory) are listed below for reference.     Microbiology: Recent Results (from the past 240 hour(s))  Novel Coronavirus, NAA ([facility])     Status: Abnormal   Collection Time: 05/30/19 12:10 PM   Specimen:  Nasopharyngeal(NP) swabs in vial transport medium   NASOPHARYNGE  TESTING  Result Value Ref Range Status   SARS-CoV-2, NAA Detected (A) Not Detected Final    Comment: This nucleic acid amplification test was developed and its performance characteristics determined by [employer]. Nucleic acid amplification tests include PCR and TMA. This test has not been FDA cleared or approved. This test has been authorized by FDA under an Emergency Use Authorization (EUA). This test is only authorized for the duration of time the declaration that circumstances exist justifying the authorization of the emergency use of in vitro diagnostic tests for detection of SARS-CoV-2 virus and/or diagnosis of COVID-19 infection under section 564(b)(1) of the Act, 21 U.S.C. 540GQQ-7(Y) (1), unless the authorization is terminated or revoked sooner. When diagnostic testing is negative, the possibility of a false negative result should be considered in the context of a patient's recent exposures and the presence of clinical signs and symptoms consistent with COVID-19. An individual without symptoms  of COVID-19 and who is not shedding SARS-CoV-2 virus would  expect to have a negative (not detected) result in this assay.   Blood Culture (routine x 2)     Status: None   Collection Time: 05/31/19  7:45 PM   Specimen: BLOOD  Result Value Ref Range Status   Specimen Description   Final    BLOOD RIGHT ANTECUBITAL Performed at [facility], [address] [place], [place] [address]    Special Requests   Final    BOTTLES DRAWN AEROBIC AND ANAEROBIC Blood Culture adequate volume Performed at [facility], [address] [place], [place] [address]    Culture   Final    NO GROWTH 5 DAYS Performed at [facility], [address], [place], [place] [address]    Report Status 06/05/2019 FINAL  Final  Blood Culture (routine x 2)     Status: None   Collection Time: 05/31/19  8:00 PM    Specimen: BLOOD RIGHT FOREARM  Result Value Ref Range Status   Specimen Description   Final    BLOOD RIGHT FOREARM Performed at [facility], [address], [place], [place] [address]    Special Requests   Final    BOTTLES DRAWN AEROBIC AND ANAEROBIC Blood Culture adequate volume Performed at [facility], [address] [place], [place] [address]    Culture   Final    NO GROWTH 5 DAYS Performed at [facility], [address], [place], [place] [address]    Report Status 06/05/2019 FINAL  Final  Blood Culture (routine x 2)     Status: None (Preliminary result)   Collection Time: 06/03/19 12:30 PM   Specimen: BLOOD  Result Value Ref Range Status   Specimen Description   Final    BLOOD LEFT ANTECUBITAL Performed at [facility], [address] [place], [place] [address]    Special Requests   Final    BOTTLES DRAWN AEROBIC AND ANAEROBIC Blood Culture adequate volume Performed at [facility], [address] [place], [place] [address]    Culture   Final    NO GROWTH 4 DAYS Performed at [facility], [address] [place], [place] [address]    Report Status PENDING  Incomplete  Blood Culture (routine x 2)     Status: None (Preliminary result)   Collection Time: 06/03/19 12:35 PM   Specimen: BLOOD LEFT FOREARM  Result Value Ref Range Status   Specimen Description   Final    BLOOD LEFT FOREARM Performed at [facility], [address], [place], [place] [address]    Special Requests   Final    BOTTLES DRAWN AEROBIC AND ANAEROBIC Blood Culture results may not be optimal due to an excessive volume of blood received in culture bottles Performed at [facility], [address] [place], [place] [address]    Culture   Final    NO GROWTH 4 DAYS Performed at [facility], [address], [place], [place] [address]    Report Status PENDING  Incomplete     Labs: BNP  (last 3 results) No results for input(s): BNP in the last 8760 hours. Basic Metabolic Panel: Recent Labs  Lab 06/04/19 0405 06/05/19 0526 06/06/19 0845 06/07/19 0500 06/08/19 0435  NA 139 140 143 143 140  K 4.3 3.4* 3.4* 3.1* 3.1*  CL 104 103  106 104 102  CO2 26 21* GLUCOSE 220* 232* 126* 90 161*  BUN 15 25* 36* 37* 30*  CREATININE 0.95 1.13 1.15 1.01 0.85  CALCIUM 8.1* 8.7* 8.5* 8.2* 7.9*  MG 2.2 2.3 2.3 2.0 2.1  PHOS 3.1 3.9 4.2 3.6 3.2   Liver Function Tests: Recent Labs  Lab 06/04/19 0405 06/05/19 0526 06/06/19 0845 06/07/19 0500 06/08/19 0435  AST 33 51* 62* 59* 34  ALT 19 29 36 43 31  ALKPHOS 50 64 61 63 52  BILITOT 0.5 0.7 0.8 0.9 0.6  PROT 6.9 7.6 7.2 6.9 5.7*  ALBUMIN 2.4* 2.9* 2.7* 2.7* 2.3*   No results for input(s): LIPASE, AMYLASE in the last 168 hours. No results for input(s): AMMONIA in the last 168 hours. CBC: Recent Labs  Lab 06/04/19 0405 06/05/19 0526 06/06/19 0845 06/07/19 0500 06/08/19 0435  WBC 8.3 22.0* 21.1* 15.2* 13.5*  NEUTROABS 7.3 19.3* 18.3* 12.0* 10.5*  HGB 10.5* 12.0* 12.2* 11.8* 10.6*  HCT 32.9* 36.5* 37.6* 36.1* 32.2*  MCV 95.1 92.6 93.3 93.3 92.3  PLT 304 432* 420* 412* 371   Cardiac Enzymes: No results for input(s): CKTOTAL, CKMB, CKMBINDEX, TROPONINI in the last 168 hours. BNP: Invalid input(s): POCBNP CBG: Recent Labs  Lab 06/07/19 0326 06/07/19 0724 06/07/19 1226 06/07/19 2351 06/08/19 0029  GLUCAP 75 115* 288* 46* 85   D-Dimer Recent Labs    06/07/19 0500 06/08/19 0435  DDIMER 2.82* 1.78*   Hgb A1c No results for input(s): HGBA1C in the last 72 hours. Lipid Profile No results for input(s): CHOL, HDL, LDLCALC, TRIG, CHOLHDL, LDLDIRECT in the last 72 hours. Thyroid function studies No results for input(s): TSH, T4TOTAL, T3FREE, THYROIDAB in the last 72 hours.  Invalid input(s): FREET3 Anemia work up Recent Labs    06/07/19 0500 06/08/19 0435  FERRITIN 626* 462*   Urinalysis     Component Value Date/Time   COLORURINE YELLOW 09/09/2012 1601   APPEARANCEUR CLEAR 09/09/2012 1601   LABSPEC 1.029 09/09/2012 1601   PHURINE 5.5 09/09/2012 1601   GLUCOSEU NEGATIVE 09/09/2012 1601   HGBUR NEGATIVE 09/09/2012 1601   BILIRUBINUR NEGATIVE 09/09/2012 1601   KETONESUR NEGATIVE 09/09/2012 1601   PROTEINUR NEGATIVE 09/09/2012 1601   UROBILINOGEN 1.0 09/09/2012 1601   NITRITE NEGATIVE 09/09/2012 1601   LEUKOCYTESUR NEGATIVE 09/09/2012 1601   Sepsis Labs Invalid input(s): PROCALCITONIN,  WBC,  LACTICIDVEN Microbiology Recent Results (from the past 240 hour(s))  Novel Coronavirus, NAA ([facility])     Status: Abnormal   Collection Time: 05/30/19 12:10 PM   Specimen: Nasopharyngeal(NP) swabs in vial transport medium   NASOPHARYNGE  TESTING  Result Value Ref Range Status   SARS-CoV-2, NAA Detected (A) Not Detected Final    Comment: This nucleic acid amplification test was developed and its performance characteristics determined by [employer]. Nucleic acid amplification tests include PCR and TMA. This test has not been FDA cleared or approved. This test has been authorized by FDA under an Emergency Use Authorization (EUA). This test is only authorized for the duration of time the declaration that circumstances exist justifying the authorization of the emergency use of in vitro diagnostic tests for detection of SARS-CoV-2 virus and/or diagnosis of COVID-19 infection under section 564(b)(1) of the Act, 21 U.S.C. 562ZHY-8(M) (1), unless the authorization is terminated or revoked sooner. When diagnostic testing is negative, the possibility of a false negative result should be considered in the context of a patient's recent exposures and the presence of clinical signs  and symptoms consistent with COVID-19. An individual without symptoms of COVID-19 and who is not shedding SARS-CoV-2 virus would  expect to have a negative (not detected) result in this assay.   Blood  Culture (routine x 2)     Status: None   Collection Time: 05/31/19  7:45 PM   Specimen: BLOOD  Result Value Ref Range Status   Specimen Description   Final    BLOOD RIGHT ANTECUBITAL Performed at [facility] [place], [address] [place], [place] [address]    Special Requests   Final    BOTTLES DRAWN AEROBIC AND ANAEROBIC Blood Culture adequate volume Performed at [facility] [place], [address] [place], [place] [address]    Culture   Final    NO GROWTH 5 DAYS Performed at [facility], [address] [place], [place] [address]    Report Status 06/05/2019 FINAL  Final  Blood Culture (routine x 2)     Status: None   Collection Time: 05/31/19  8:00 PM   Specimen: BLOOD RIGHT FOREARM  Result Value Ref Range Status   Specimen Description   Final    BLOOD RIGHT FOREARM Performed at [facility], [address] [place], [place] [address]    Special Requests   Final    BOTTLES DRAWN AEROBIC AND ANAEROBIC Blood Culture adequate volume Performed at [facility] [place], [address] [place], [place] [address]    Culture   Final    NO GROWTH 5 DAYS Performed at [facility], [address] [place], [place] [address]    Report Status 06/05/2019 FINAL  Final  Blood Culture (routine x 2)     Status: None (Preliminary result)   Collection Time: 06/03/19 12:30 PM   Specimen: BLOOD  Result Value Ref Range Status   Specimen Description   Final    BLOOD LEFT ANTECUBITAL Performed at [facility], [address] [place], [place] [address]    Special Requests   Final    BOTTLES DRAWN AEROBIC AND ANAEROBIC Blood Culture adequate volume Performed at [facility], [address] [place], [place] [address]    Culture   Final    NO GROWTH 4 DAYS Performed at [facility], [address] [place], [place] [address]    Report Status PENDING  Incomplete  Blood Culture (routine x 2)     Status: None  (Preliminary result)   Collection Time: 06/03/19 12:35 PM   Specimen: BLOOD LEFT FOREARM  Result Value Ref Range Status   Specimen Description   Final    BLOOD LEFT FOREARM Performed at [facility], [address] [place], [place] [address]    Special Requests   Final    BOTTLES DRAWN AEROBIC AND ANAEROBIC Blood Culture results may not be optimal due to an excessive volume of blood received in culture bottles Performed at [facility], [address] [place], [place] [address]    Culture   Final    NO GROWTH 4 DAYS Performed at [facility], [address] [place], [place] [address]    Report Status PENDING  Incomplete     Time coordinating discharge: Over 40 minutes  SIGNED:   Marinda ElkAbraham Feliz Ortiz, MD  [facility] 06/08/2019, 7:35 AM Pager   If 7PM-7AM, please contact night-coverage [email or web address] Password [email or web address]

## 2019-06-12 NOTE — Consult Note (Signed)
   [facility] CM Inpatient Consult   06/12/2019  NIXON KOLTON 1946-03-04 [id number]    Follow-Up Note:   Received an inbasket message from Smolan (L. Comer) regarding patient returning call.   Attempted to call patient twice to return his call, but no response.  HIPAA compliant voice messages left, with request to call back.  Awaiting for patient's return call.   For questions, please call:  Edwena Felty A. Faiza Bansal, BSN, RN-BC [facility] Liaison Cell: [phone]

## 2019-06-19 DIAGNOSIS — E785 Hyperlipidemia, unspecified: Secondary | ICD-10-CM | POA: Diagnosis not present

## 2019-06-19 DIAGNOSIS — I10 Essential (primary) hypertension: Secondary | ICD-10-CM | POA: Diagnosis not present

## 2019-06-19 DIAGNOSIS — E611 Iron deficiency: Secondary | ICD-10-CM | POA: Diagnosis not present

## 2019-06-19 DIAGNOSIS — U071 COVID-19: Secondary | ICD-10-CM | POA: Diagnosis not present

## 2019-06-23 ENCOUNTER — Ambulatory Visit: Payer: Medicare HMO | Admitting: Podiatry

## 2019-06-25 ENCOUNTER — Emergency Department (HOSPITAL_BASED_OUTPATIENT_CLINIC_OR_DEPARTMENT_OTHER)
Admission: EM | Admit: 2019-06-25 | Discharge: 2019-06-25 | Disposition: A | Discharge status: Home / Self Care | Payer: No Typology Code available for payment source | Attending: Emergency Medicine | Admitting: Emergency Medicine

## 2019-06-25 ENCOUNTER — Other Ambulatory Visit: Payer: Self-pay

## 2019-06-25 ENCOUNTER — Emergency Department (HOSPITAL_BASED_OUTPATIENT_CLINIC_OR_DEPARTMENT_OTHER): Payer: No Typology Code available for payment source

## 2019-06-25 ENCOUNTER — Encounter (HOSPITAL_BASED_OUTPATIENT_CLINIC_OR_DEPARTMENT_OTHER): Payer: Self-pay

## 2019-06-25 DIAGNOSIS — R0789 Other chest pain: Secondary | ICD-10-CM | POA: Diagnosis not present

## 2019-06-25 DIAGNOSIS — J40 Bronchitis, not specified as acute or chronic: Secondary | ICD-10-CM | POA: Insufficient documentation

## 2019-06-25 DIAGNOSIS — R0989 Other specified symptoms and signs involving the circulatory and respiratory systems: Secondary | ICD-10-CM | POA: Insufficient documentation

## 2019-06-25 DIAGNOSIS — R0981 Nasal congestion: Secondary | ICD-10-CM

## 2019-06-25 DIAGNOSIS — E119 Type 2 diabetes mellitus without complications: Secondary | ICD-10-CM | POA: Insufficient documentation

## 2019-06-25 DIAGNOSIS — Z79899 Other long term (current) drug therapy: Secondary | ICD-10-CM | POA: Insufficient documentation

## 2019-06-25 DIAGNOSIS — I10 Essential (primary) hypertension: Secondary | ICD-10-CM | POA: Insufficient documentation

## 2019-06-25 DIAGNOSIS — R9431 Abnormal electrocardiogram [ECG] [EKG]: Secondary | ICD-10-CM | POA: Diagnosis not present

## 2019-06-25 DIAGNOSIS — R05 Cough: Secondary | ICD-10-CM | POA: Insufficient documentation

## 2019-06-25 LAB — COMPREHENSIVE METABOLIC PANEL
ALT: 18 U/L (ref 0–44)
AST: 18 U/L (ref 15–41)
Albumin: 3.2 g/dL — ABNORMAL LOW (ref 3.5–5.0)
Alkaline Phosphatase: 70 U/L (ref 38–126)
Anion gap: 8 (ref 5–15)
BUN: 16 mg/dL (ref 8–23)
CO2: 25 mmol/L (ref 22–32)
Calcium: 8.7 mg/dL — ABNORMAL LOW (ref 8.9–10.3)
Chloride: 105 mmol/L (ref 98–111)
Creatinine, Ser: 1 mg/dL (ref 0.61–1.24)
GFR calc Af Amer: >60 mL/min (ref >60)
GFR calc non Af Amer: >60 mL/min (ref >60)
Glucose, Bld: 254 mg/dL — ABNORMAL HIGH (ref 70–99)
Potassium: 3.7 mmol/L (ref 3.5–5.1)
Sodium: 138 mmol/L (ref 135–145)
Total Bilirubin: 0.4 mg/dL (ref 0.3–1.2)
Total Protein: 6.6 g/dL (ref 6.5–8.1)

## 2019-06-25 LAB — TROPONIN I (HIGH SENSITIVITY)
Troponin I (High Sensitivity): 3 ng/L (ref <18)
Troponin I (High Sensitivity): 2 ng/L (ref <18)

## 2019-06-25 LAB — CBC
HCT: 34.1 % — ABNORMAL LOW (ref 39.0–52.0)
Hemoglobin: 11.1 g/dL — ABNORMAL LOW (ref 13.0–17.0)
MCH: 30.8 pg (ref 26.0–34.0)
MCHC: 32.6 g/dL (ref 30.0–36.0)
MCV: 94.7 fL (ref 80.0–100.0)
Platelets: 264 10*3/uL (ref 150–400)
RBC: 3.6 MIL/uL — ABNORMAL LOW (ref 4.22–5.81)
RDW: 13.6 % (ref 11.5–15.5)
WBC: 8 10*3/uL (ref 4.0–10.5)
nRBC: 0 % (ref 0.0–0.2)

## 2019-06-25 MED ORDER — AZITHROMYCIN 200 MG/5ML PO SUSR: ORAL | 0 refills | Status: DC

## 2019-06-25 NOTE — Discharge Instructions (Signed)
It was our pleasure to provide your ER care today - we hope that you feel better.  Take antibiotic as prescribed.   Rest. Drink adequate fluids.   Follow up with primary care doctor in 1 week if symptoms fail to improve/resolve.  Return to ER if worse, new symptoms, high fevers, chest pain, increased trouble breathing, or other concern.

## 2019-06-25 NOTE — ED Provider Notes (Addendum)
[facility] EMERGENCY DEPARTMENT Provider Note   CSN: [id number] Arrival date & time: 06/25/19  1710     History   Chief Complaint Chief Complaint  Patient presents with  . Nasal Congestion    HPI GARLEN REINIG is a 73 y.o. male.     Patient indicates end of October was dx with covid and admitted for several days - improved and was d/c to home 11/5. In past 2 days, new onset nasal congestion, non prod cough, feels tight in chest. Symptoms acute onset, moderate, persistent, non radiating.  No exertional cp or discomfort. No pleuritic pain. Denies leg pain or swelling. No fever or chills. No new known ill contacts. No heartburn. No sore throat or trouble swallowing. No sob.   The history is provided by the patient.    Past Medical History:  Diagnosis Date  . Bronchitis   . Diabetes mellitus without complication (HCC)   . GERD (gastroesophageal reflux disease)   . Hypertension     Patient Active Problem List   Diagnosis Date Noted  . Diabetes mellitus type 2, uncontrolled, with complications (HCC) 06/05/2019  . Essential hypertension 06/05/2019  . Pneumonia due to COVID-19 virus 06/03/2019  . Type 2 diabetes mellitus (HCC) 06/03/2019  . Fall 06/03/2019  . Acute respiratory failure with hypoxia (HCC) 06/03/2019  . Sepsis due to pneumonia (HCC) 05/31/2019  . Lumbar radiculopathy 03/03/2018  . Hypertension associated with diabetes (HCC) 08/08/2012  . GERD (gastroesophageal reflux disease) 08/08/2012    History reviewed. No pertinent surgical history.      Home Medications    Prior to Admission medications   Medication Sig Start Date End Date Taking? Authorizing Provider  acetaminophen (TYLENOL) 160 MG/5ML elixir Take 15.6 mLs (500 mg total) by mouth every 4 (four) hours as needed for fever. 05/29/19   Arby Barrette, MD  bimatoprost (LUMIGAN) 0.03 % ophthalmic solution Place 1 drop into both eyes at bedtime.    [provider]   lisinopril-hydrochlorothiazide (ZESTORETIC) 20-25 MG tablet Take 1 tablet by mouth daily.    [provider]  metoprolol tartrate (LOPRESSOR) 25 MG tablet Take 1 tablet (25 mg total) by mouth 2 (two) times daily. 06/08/19   Marinda Elk, MD    Family History Family History  Problem Relation Age of Onset  . Diabetes Mother   . GER disease Mother   . Cancer Father        pancreas  . Diabetes Sister   . Diabetes Brother     Social History Social History   Tobacco Use  . Smoking status: Never Smoker  . Smokeless tobacco: Never Used  Substance Use Topics  . Alcohol use: No  . Drug use: No     Allergies   Penicillins, Trazodone and nefazodone, Ativan [lorazepam], Decongestant [pseudoephedrine hcl er], and Talwin [pentazocine]   Review of Systems Review of Systems  Constitutional: Negative for fever.  HENT: Positive for congestion. Negative for sore throat.   Eyes: Negative for redness.  Respiratory: Positive for cough. Negative for shortness of breath.   Cardiovascular: Negative for palpitations and leg swelling.  Gastrointestinal: Negative for abdominal pain, diarrhea and vomiting.  Genitourinary: Negative for flank pain.  Musculoskeletal: Negative for back pain and neck pain.  Skin: Negative for rash.  Neurological: Negative for headaches.  Hematological: Does not bruise/bleed easily.  Psychiatric/Behavioral: Negative for confusion.     Physical Exam Updated Vital Signs BP (!) 182/83 (BP Location: Right Arm)   Pulse 97  Temp 98.7 F (37.1 C) (Oral)   Resp 20   Ht 1.727 m (5\' 8" )   Wt 71.2 kg   SpO2 97%   BMI 23.87 kg/m   Physical Exam Vitals signs and nursing note reviewed.  Constitutional:      Appearance: Normal appearance. He is well-developed.  HENT:     Head: Atraumatic.     Nose: Congestion present.     Mouth/Throat:     Mouth: Mucous membranes are moist.     Pharynx: Oropharynx is clear.  Eyes:     General: No scleral  icterus.    Conjunctiva/sclera: Conjunctivae normal.     Pupils: Pupils are equal, round, and reactive to light.  Neck:     Musculoskeletal: Normal range of motion and neck supple. No neck rigidity.     Trachea: No tracheal deviation.  Cardiovascular:     Rate and Rhythm: Normal rate and regular rhythm.     Pulses: Normal pulses.     Heart sounds: Normal heart sounds. No murmur. No friction rub. No gallop.   Pulmonary:     Effort: Pulmonary effort is normal. No accessory muscle usage or respiratory distress.     Breath sounds: Normal breath sounds.  Chest:     Chest wall: No tenderness.  Abdominal:     General: Bowel sounds are normal. There is no distension.     Palpations: Abdomen is soft.     Tenderness: There is no abdominal tenderness. There is no guarding.  Genitourinary:    Comments: No cva tenderness. Musculoskeletal:        General: No swelling or tenderness.     Right lower leg: No edema.     Left lower leg: No edema.  Skin:    General: Skin is warm and dry.     Findings: No rash.  Neurological:     Mental Status: He is alert.     Comments: Alert, speech clear.   Psychiatric:        Mood and Affect: Mood normal.      ED Treatments / Results  Labs (all labs ordered are listed, but only abnormal results are displayed) Results for orders placed or performed during the hospital encounter of 06/25/19  CBC  Result Value Ref Range   WBC 8.0 4.0 - 10.5 K/uL   RBC 3.60 (L) 4.22 - 5.81 MIL/uL   Hemoglobin 11.1 (L) 13.0 - 17.0 g/dL   HCT 40.934.1 (L) 81.139.0 - 91.452.0 %   MCV 94.7 80.0 - 100.0 fL   MCH 30.8 26.0 - 34.0 pg   MCHC 32.6 30.0 - 36.0 g/dL   RDW 78.213.6 95.611.5 - 21.315.5 %   Platelets 264 150 - 400 K/uL   nRBC 0.0 0.0 - 0.2 %  CMET  Result Value Ref Range   Sodium 138 135 - 145 mmol/L   Potassium 3.7 3.5 - 5.1 mmol/L   Chloride 105 98 - 111 mmol/L   CO2 25 22 - 32 mmol/L   Glucose, Bld 254 (H) 70 - 99 mg/dL   BUN 16 8 - 23 mg/dL   Creatinine, Ser 0.861.00 0.61 - 1.24  mg/dL   Calcium 8.7 (L) 8.9 - 10.3 mg/dL   Total Protein 6.6 6.5 - 8.1 g/dL   Albumin 3.2 (L) 3.5 - 5.0 g/dL   AST 18 15 - 41 U/L   ALT 18 0 - 44 U/L   Alkaline Phosphatase 70 38 - 126 U/L   Total Bilirubin 0.4 0.3 -  1.2 mg/dL   GFR calc non Af Amer >60 >60 mL/min   GFR calc Af Amer >60 >60 mL/min   Anion gap 8 5 - 15  Troponin I (High Sensitivity)  Result Value Ref Range   Troponin I (High Sensitivity) 3 <18 ng/L  Troponin I (High Sensitivity)  Result Value Ref Range   Troponin I (High Sensitivity) 2 <18 ng/L    EKG EKG Interpretation  Date/Time:  Sunday June 25 2019 20:16:37 EST Ventricular Rate:  83 PR Interval:    QRS Duration: 96 QT Interval:  389 QTC Calculation: 458 R Axis:   65 Text Interpretation: Sinus rhythm Nonspecific ST abnormality Confirmed by Lajean Saver [phone]) on 06/25/2019 8:58:24 PM   Radiology Xr Chest Portable  Result Date: 06/25/2019 CLINICAL DATA:  Cough, chest tightness EXAM: PORTABLE CHEST 1 VIEW COMPARISON:  Chest radiograph dated 06/03/2019 FINDINGS: The heart size and mediastinal contours are within normal limits. Mild bibasilar airspace opacities are similar to prior exam. There is no pleural effusion or pneumothorax. The visualized skeletal structures are unremarkable. IMPRESSION: Mild bibasilar airspace opacities, similar to prior exam. Electronically Signed   By: Zerita Boers M.D.   On: 06/25/2019 20:11    Procedures Procedures (including critical care time)  Medications Ordered in ED Medications - No data to display   Initial Impression / Assessment and Plan / ED Course  I have reviewed the triage vital signs and the nursing notes.  Pertinent labs & imaging results that were available during my care of the patient were reviewed by me and considered in my medical decision making (see chart for details).  Labs and imaging ordered. Ecg.   Reviewed nursing notes and prior charts for additional history.   Labs  reviewed/interpreted by me - chem normal. Trop normal.   CXR reviewed/interpreted by me - c/w recent viral pna.   Given increased cough, ?possible bronchitis. Pt is not hypoxic and has normal work of breathing.   Pt requests rx liquid antibiotic - will provide.   Patient currently appears stable for d/c.   Return precautions provided.     Final Clinical Impressions(s) / ED Diagnoses   Final diagnoses:  None    ED Discharge Orders    None          Lajean Saver, MD 06/25/19 2258

## 2019-06-25 NOTE — ED Triage Notes (Signed)
Pt reports that he was dx with covid on Oct 31st, got out of the hospital on Nov 5th. Reports that he has recently developed increased nasal and chest congestion.

## 2019-06-26 DIAGNOSIS — R05 Cough: Secondary | ICD-10-CM | POA: Diagnosis not present

## 2019-06-26 DIAGNOSIS — R079 Chest pain, unspecified: Secondary | ICD-10-CM | POA: Diagnosis not present

## 2019-06-26 DIAGNOSIS — E119 Type 2 diabetes mellitus without complications: Secondary | ICD-10-CM | POA: Diagnosis not present

## 2019-06-26 MED FILL — AZITHROMYCIN 200 MG/5ML SUS: 200 | 7 days supply | Qty: 45 | Fill #0

## 2019-07-05 ENCOUNTER — Encounter (INDEPENDENT_AMBULATORY_CARE_PROVIDER_SITE_OTHER): Payer: Self-pay | Admitting: Otolaryngology

## 2019-07-05 ENCOUNTER — Ambulatory Visit (INDEPENDENT_AMBULATORY_CARE_PROVIDER_SITE_OTHER): Payer: Medicare HMO | Admitting: Otolaryngology

## 2019-07-05 ENCOUNTER — Other Ambulatory Visit: Payer: Self-pay

## 2019-07-05 VITALS — Temp 97.7°F

## 2019-07-05 DIAGNOSIS — J310 Chronic rhinitis: Secondary | ICD-10-CM | POA: Diagnosis not present

## 2019-07-05 NOTE — Progress Notes (Signed)
HPI: Ethan Pierce is a 73 y.o. male who returns today for evaluation of chronic nasal congestion.  Apparently the end of October patient developed COVID and bronchopneumonia and was hospitalized at the [facility] for 4 to 5 days.  He is slowly getting better but complains of some nasal congestion.  Still has some shortness of breath.  Has not been having any fevers.  Denies any yellow-green discharge from his nose.Marland Kitchen  Past Medical History:  Diagnosis Date  . Bronchitis   . Diabetes mellitus without complication (HCC)   . GERD (gastroesophageal reflux disease)   . Hypertension    No past surgical history on file. Social History   Socioeconomic History  . Marital status: Married    Spouse name: Not on file  . Number of children: Not on file  . Years of education: Not on file  . Highest education level: Not on file  Occupational History  . Not on file  Social Needs  . Financial resource strain: Not on file  . Food insecurity    Worry: Not on file    Inability: Not on file  . Transportation needs    Medical: Not on file    Non-medical: Not on file  Tobacco Use  . Smoking status: Never Smoker  . Smokeless tobacco: Never Used  Substance and Sexual Activity  . Alcohol use: No  . Drug use: No  . Sexual activity: Yes  Lifestyle  . Physical activity    Days per week: Not on file    Minutes per session: Not on file  . Stress: Not on file  Relationships  . Social Musician on phone: Not on file    Gets together: Not on file    Attends religious service: Not on file    Active member of club or organization: Not on file    Attends meetings of clubs or organizations: Not on file    Relationship status: Not on file  Other Topics Concern  . Not on file  Social History Narrative  . Not on file   Family History  Problem Relation Age of Onset  . Diabetes Mother   . GER disease Mother   . Cancer Father        pancreas  . Diabetes Sister   . Diabetes Brother     Allergies  Allergen Reactions  . Penicillins Anaphylaxis    Did it involve swelling of the face/tongue/throat, SOB, or low BP? Yes Did it involve sudden or severe rash/hives, skin peeling, or any reaction on the inside of your mouth or nose? No Did you need to seek medical attention at a hospital or doctor's office? No When did it last happen? If all above answers are "NO", may proceed with cephalosporin use.   . Trazodone And Nefazodone Other (See Comments)    Confusion, agitation, and hyperalert  . Ativan [Lorazepam] Other (See Comments)    agitation   . Decongestant [Pseudoephedrine Hcl Er] Other (See Comments)    Elevated blood pressure   . Talwin [Pentazocine] Other (See Comments)    Elevated blood pressure.   Prior to Admission medications   Medication Sig Start Date End Date Taking? Authorizing Provider  acetaminophen (TYLENOL) 160 MG/5ML elixir Take 15.6 mLs (500 mg total) by mouth every 4 (four) hours as needed for fever. 05/29/19  Yes Arby Barrette, MD  azithromycin (ZITHROMAX) 200 MG/5ML suspension Take 12.5 ml's on the first day, then take 7 ml's per day for  the next 4 days 06/25/19  Yes Lajean Saver, MD  bimatoprost (LUMIGAN) 0.03 % ophthalmic solution Place 1 drop into both eyes at bedtime.   Yes [provider]  lisinopril-hydrochlorothiazide (ZESTORETIC) 20-25 MG tablet Take 1 tablet by mouth daily.   Yes [provider]  metoprolol tartrate (LOPRESSOR) 25 MG tablet Take 1 tablet (25 mg total) by mouth 2 (two) times daily. 06/08/19  Yes Charlynne Cousins, MD     Positive ROS: Otherwise negative  All other systems have been reviewed and were otherwise negative with the exception of those mentioned in the HPI and as above.  Physical Exam: Constitutional: Alert, well-appearing, no acute distress Ears: External ears without lesions or tenderness. Ear canals are clear bilaterally with intact, clear TMs.  Nasal: External nose without  lesions. Septum is relatively midline.. Clear nasal passages.  No polyps no obstructing lesions within the nasal cavity both middle meatus regions were clear with no signs of infection.  Moderate rhinitis. Oral: Lips and gums without lesions. Tongue and palate mucosa without lesions. Posterior oropharynx clear. Neck: No palpable adenopathy or masses Respiratory: Breathing comfortably  Skin: No facial/neck lesions or rash noted.  Procedures  Assessment: Chronic rhinitis History of COVID and bronchopneumonia.  Plan: Recommended regular use of nasal steroid spray Nasacort 2 sprays each nostril at night and gave him a prescription. Also suggested use of saline irrigation and suggested using Xlear brand. He will follow-up as needed.   Radene Journey, MD

## 2019-07-13 ENCOUNTER — Encounter: Payer: Self-pay | Admitting: Critical Care Medicine

## 2019-07-13 ENCOUNTER — Other Ambulatory Visit: Payer: Self-pay

## 2019-07-13 ENCOUNTER — Ambulatory Visit: Payer: Medicare HMO | Admitting: Critical Care Medicine

## 2019-07-13 VITALS — BP 160/72 | HR 68 | Temp 98.7°F | Ht 67.0 in | Wt 160.4 lb

## 2019-07-13 DIAGNOSIS — Z23 Encounter for immunization: Secondary | ICD-10-CM | POA: Diagnosis not present

## 2019-07-13 DIAGNOSIS — Z8619 Personal history of other infectious and parasitic diseases: Secondary | ICD-10-CM | POA: Diagnosis not present

## 2019-07-13 DIAGNOSIS — Z8616 Personal history of COVID-19: Secondary | ICD-10-CM

## 2019-07-13 NOTE — Patient Instructions (Addendum)
Thank you for visiting Dr. Carlis Abbott at [facility]. We recommend the following:  Flu shot today. Talk to your primary care doctor about your pneumonia shots.    Return if symptoms worsen or fail to improve.    Please do your part to reduce the spread of COVID-19.

## 2019-07-13 NOTE — Progress Notes (Signed)
Synopsis: Referred in December 2020 for post-COVID by Ethan Gravel, MD.  Subjective:   PATIENT ID: Ethan Pierce GENDER: male DOB: [birth year], MRN: [id number]  Chief Complaint  Patient presents with  . Consult    Patient is here post covid Oct 31st. Bronchitis 2 weeks ago with z-pak.    Ethan Pierce is a 73 year old gentleman who presents for evaluation after having COVID-19 viral pneumonia.  He was diagnosed in late October and admitted to [facility] from 10/31-11/5.  He received remdesivir and IV steroids while in the hospital.  He did not require supplemental oxygen at the time of discharge.  He has been having ongoing nasal congestion and shortness of breath at home, and was treated with azithromycin for 5 days on 11/22 after being seen in the emergency department.  He was also started on Zyrtec recently.  He denies shortness of breath or dyspnea on exertion.  His cough has improved, and only occurs periodically in the evenings, but not overnight.  He has noticed that his heart rate is higher when he is exercising, but recovers when he is resting.  His endurance is improving and he is regaining weight that he had lost.  He has been checking his oxygen saturations at home and they have not been dropping.  He never smoked or vaped.  He denies other new complaints.  He has not yet had his flu vaccine or pneumonia vaccines.      Past Medical History:  Diagnosis Date  . Bronchitis   . Diabetes mellitus without complication ([place])   . GERD (gastroesophageal reflux disease)   . History of 2019 novel coronavirus disease (COVID-19) 05/2019  . Hypertension      Family History  Problem Relation Age of Onset  . Diabetes Mother   . GER disease Mother   . Cancer Father        pancreas  . Diabetes Sister   . Diabetes Brother      Past Surgical History:  Procedure Laterality Date  . CATARACT EXTRACTION, BILATERAL  2018, 2019  . WISDOM TOOTH EXTRACTION  05/2019    Social  History   Socioeconomic History  . Marital status: Married    Spouse name: Not on file  . Number of children: Not on file  . Years of education: Not on file  . Highest education level: Not on file  Occupational History  . Not on file  Tobacco Use  . Smoking status: Never Smoker  . Smokeless tobacco: Never Used  Substance and Sexual Activity  . Alcohol use: No  . Drug use: No  . Sexual activity: Yes  Other Topics Concern  . Not on file  Social History Narrative  . Not on file   Social Determinants of Health   Financial Resource Strain:   . Difficulty of Paying Living Expenses: Not on file  Food Insecurity:   . Worried About Charity fundraiser in the Last Year: Not on file  . Ran Out of Food in the Last Year: Not on file  Transportation Needs:   . Lack of Transportation (Medical): Not on file  . Lack of Transportation (Non-Medical): Not on file  Physical Activity:   . Days of Exercise per Week: Not on file  . Minutes of Exercise per Session: Not on file  Stress:   . Feeling of Stress : Not on file  Social Connections:   . Frequency of Communication with Friends and Family: Not on file  .  Frequency of Social Gatherings with Friends and Family: Not on file  . Attends Religious Services: Not on file  . Active Member of Clubs or Organizations: Not on file  . Attends BankerClub or Organization Meetings: Not on file  . Marital Status: Not on file  Intimate Partner Violence:   . Fear of Current or Ex-Partner: Not on file  . Emotionally Abused: Not on file  . Physically Abused: Not on file  . Sexually Abused: Not on file     Allergies  Allergen Reactions  . Penicillins Anaphylaxis    Did it involve swelling of the face/tongue/throat, SOB, or low BP? Yes Did it involve sudden or severe rash/hives, skin peeling, or any reaction on the inside of your mouth or nose? No Did you need to seek medical attention at a hospital or doctor's office? No When did it last happen? If  all above answers are "NO", may proceed with cephalosporin use.   . Trazodone And Nefazodone Other (See Comments)    Confusion, agitation, and hyperalert  . Ativan [Lorazepam] Other (See Comments)    agitation   . Decongestant [Pseudoephedrine Hcl Er] Other (See Comments)    Elevated blood pressure   . Talwin [Pentazocine] Other (See Comments)    Elevated blood pressure.     Immunization History  Administered Date(s) Administered  . Fluad Quad(high Dose 65+) 07/13/2019    Outpatient Medications Prior to Visit  Medication Sig Dispense Refill  . acetaminophen (TYLENOL) 160 MG/5ML elixir Take 15.6 mLs (500 mg total) by mouth every 4 (four) hours as needed for fever. 473 mL 2  . bimatoprost (LUMIGAN) 0.03 % ophthalmic solution Place 1 drop into both eyes at bedtime.    Marland Kitchen. lisinopril-hydrochlorothiazide (ZESTORETIC) 20-25 MG tablet Take 1 tablet by mouth daily.    . Melatonin 3 MG TABS Take 3 mg by mouth at bedtime.    . metoprolol tartrate (LOPRESSOR) 25 MG tablet Take 1 tablet (25 mg total) by mouth 2 (two) times daily. 60 tablet 3  . vitamin C (ASCORBIC ACID) 500 MG tablet Take 500 mg by mouth daily.    . Zinc Citrate-Phytase 25-500 MG CAPS Take by mouth.    Marland Kitchen. azithromycin (ZITHROMAX) 200 MG/5ML suspension Take 12.5 ml's on the first day, then take 7 ml's per day for the next 4 days 22.5 mL 0   No facility-administered medications prior to visit.    Review of Systems  Constitutional: Negative for chills and fever.       Regaining weight he lost  HENT: Negative for sore throat.        Chronic right-sided nasal congestion  Eyes: Negative.   Respiratory: Negative for cough, sputum production, shortness of breath and wheezing.   Cardiovascular: Negative for leg swelling.       Occ right sided CP  Gastrointestinal: Positive for heartburn. Negative for nausea and vomiting.  Genitourinary: Negative.   Musculoskeletal: Negative for joint pain and myalgias.  Neurological: Negative for  dizziness and focal weakness.  Endo/Heme/Allergies: Negative for environmental allergies.     Objective:   Vitals:   07/13/19 1108  BP: (!) 160/72  Pulse: 68  Temp: 98.7 F (37.1 C)  TempSrc: Temporal  SpO2: 98%  Weight: 160 lb 6.4 oz (72.8 kg)  Height: 5\' 7"  (1.702 m)   98% on   RA BMI Readings from Last 3 Encounters:  07/13/19 25.12 kg/m  06/25/19 23.87 kg/m  06/04/19 25.27 kg/m   Wt Readings from Last 3 Encounters:  07/13/19 160 lb 6.4 oz (72.8 kg)  06/25/19 157 lb (71.2 kg)  06/04/19 166 lb 3.2 oz (75.4 kg)    Physical Exam Vitals reviewed.  Constitutional:      General: He is not in acute distress.    Appearance: Normal appearance. He is not ill-appearing.  HENT:     Head: Normocephalic and atraumatic.     Nose:     Comments: Deferred due to masking requirement.    Mouth/Throat:     Comments: Deferred due to masking requirement. Eyes:     General: No scleral icterus. Cardiovascular:     Rate and Rhythm: Normal rate and regular rhythm.     Heart sounds: No murmur.  Pulmonary:     Comments: Breathing comfortably on room air, no conversational dyspnea.  No witnessed coughing.  Clear to auscultation bilaterally. Abdominal:     General: There is no distension.     Tenderness: There is no abdominal tenderness.  Musculoskeletal:        General: No swelling or deformity.     Cervical back: Neck supple.  Lymphadenopathy:     Cervical: No cervical adenopathy.  Skin:    General: Skin is warm and dry.     Findings: No rash.  Neurological:     Mental Status: He is alert.     Motor: No weakness.     Coordination: Coordination normal.  Psychiatric:        Mood and Affect: Mood normal.        Behavior: Behavior normal.      CBC    Component Value Date/Time   WBC 8.0 06/25/2019 2024   RBC 3.60 (L) 06/25/2019 2024   HGB 11.1 (L) 06/25/2019 2024   HCT 34.1 (L) 06/25/2019 2024   PLT 264 06/25/2019 2024   MCV 94.7 06/25/2019 2024   MCH 30.8 06/25/2019  2024   MCHC 32.6 06/25/2019 2024   RDW 13.6 06/25/2019 2024   LYMPHSABS 1.5 06/08/2019 0435   MONOABS 1.1 (H) 06/08/2019 0435   EOSABS 0.0 06/08/2019 0435   BASOSABS 0.0 06/08/2019 0435     Chest Imaging- films reviewed: CXR, 1 view 06/25/2019-resolving right and left lower lobe peripheral infiltrates in comparison to 06/03/2019 x-ray  Pulmonary Functions Testing Results: No flowsheet data found.      Assessment & Plan:     ICD-10-CM   1. History of 2019 novel coronavirus disease (COVID-19)  Z86.19   2. Need for immunization against influenza  Z23 Flu Vaccine QUAD High Dose(Fluad)    History of COVID-19 viral pneumonia-near complete resolution of symptoms.  All pulmonary symptoms resolved. -No further follow-up is needed at this time. -Continue Covid precautions as repeat infection has been described.  Continue mask wearing, social distancing, handwashing. -Strongly recommend flu and pneumonia vaccinations.  He agreed to have his flu shot today.  He will discuss pneumonia vaccines with his PCP. -Okay to get his final wisdom tooth extracted from a pulmonary standpoint.  RTC as needed.   Current Outpatient Medications:  .  acetaminophen (TYLENOL) 160 MG/5ML elixir, Take 15.6 mLs (500 mg total) by mouth every 4 (four) hours as needed for fever., Disp: 473 mL, Rfl: 2 .  bimatoprost (LUMIGAN) 0.03 % ophthalmic solution, Place 1 drop into both eyes at bedtime., Disp: , Rfl:  .  lisinopril-hydrochlorothiazide (ZESTORETIC) 20-25 MG tablet, Take 1 tablet by mouth daily., Disp: , Rfl:  .  Melatonin 3 MG TABS, Take 3 mg by mouth at bedtime., Disp: , Rfl:  .  metoprolol tartrate (LOPRESSOR) 25 MG tablet, Take 1 tablet (25 mg total) by mouth 2 (two) times daily., Disp: 60 tablet, Rfl: 3 .  vitamin C (ASCORBIC ACID) 500 MG tablet, Take 500 mg by mouth daily., Disp: , Rfl:  .  Zinc Citrate-Phytase 25-500 MG CAPS, Take by mouth., Disp: , Rfl:    Steffanie Dunn, DO [facility]  Critical Care 07/13/2019 11:41 AM

## 2019-07-14 ENCOUNTER — Encounter: Payer: Self-pay | Admitting: Cardiology

## 2019-07-14 ENCOUNTER — Ambulatory Visit (INDEPENDENT_AMBULATORY_CARE_PROVIDER_SITE_OTHER): Payer: Medicare HMO | Admitting: Cardiology

## 2019-07-14 VITALS — BP 165/84 | HR 88 | Temp 98.9°F | Resp 14 | Ht 67.0 in | Wt 157.8 lb

## 2019-07-14 DIAGNOSIS — Z8616 Personal history of COVID-19: Secondary | ICD-10-CM

## 2019-07-14 DIAGNOSIS — Z8619 Personal history of other infectious and parasitic diseases: Secondary | ICD-10-CM

## 2019-07-14 DIAGNOSIS — I10 Essential (primary) hypertension: Secondary | ICD-10-CM | POA: Diagnosis not present

## 2019-07-14 MED ORDER — HYDROCHLOROTHIAZIDE 25 MG PO TABS: 25.0000 mg | ORAL_TABLET | ORAL | 0 refills | Status: DC

## 2019-07-14 MED FILL — HYDROCHLOROTHIAZIDE 25 MG T: 25 | 90 days supply | Qty: 90 | Fill #0

## 2019-07-14 NOTE — Progress Notes (Signed)
Primary Physician/Referring:  Pearson GrippeKim, James, MD  Patient ID: Ethan Pierce, male    DOB: 04-11-1946, 73 y.o.   MRN: [id number]  Chief Complaint  Patient presents with  . Chest Pain   HPI:    Ethan Pierce  is a 73 y.o. AAM with hypertension and DM, H/O COVID-19 pneumonia and admitted to the hospital on 06/02/2014 and received remdesivir and IV steroids, since then has been having shortness of breath and nasal congestion, decreased exercise tolerance and presented to the ED on 06/25/2019 for chest pain and had negative high-sensitivity troponins x2.Cough and also chest pain.  He is asymptomatic today. Referred to me for further cardiac evaluation.   He remains asymptomatic, has returned to full activity without limitations.  Past Medical History:  Diagnosis Date  . Bronchitis   . Diabetes mellitus without complication (HCC)   . GERD (gastroesophageal reflux disease)   . History of 2019 novel coronavirus disease (COVID-19) 05/2019  . Hypertension    Past Surgical History:  Procedure Laterality Date  . CATARACT EXTRACTION, BILATERAL  2018, 2019  . WISDOM TOOTH EXTRACTION  05/2019   Social History   Socioeconomic History  . Marital status: Married    Spouse name: Not on file  . Number of children: 2  . Years of education: Not on file  . Highest education level: Not on file  Occupational History  . Not on file  Tobacco Use  . Smoking status: Never Smoker  . Smokeless tobacco: Never Used  Substance and Sexual Activity  . Alcohol use: No  . Drug use: No  . Sexual activity: Yes  Other Topics Concern  . Not on file  Social History Narrative  . Not on file   Social Determinants of Health   Financial Resource Strain:   . Difficulty of Paying Living Expenses: Not on file  Food Insecurity:   . Worried About Programme researcher, broadcasting/film/videounning Out of Food in the Last Year: Not on file  . Ran Out of Food in the Last Year: Not on file  Transportation Needs:   . Lack of Transportation (Medical): Not  on file  . Lack of Transportation (Non-Medical): Not on file  Physical Activity:   . Days of Exercise per Week: Not on file  . Minutes of Exercise per Session: Not on file  Stress:   . Feeling of Stress : Not on file  Social Connections:   . Frequency of Communication with Friends and Family: Not on file  . Frequency of Social Gatherings with Friends and Family: Not on file  . Attends Religious Services: Not on file  . Active Member of Clubs or Organizations: Not on file  . Attends BankerClub or Organization Meetings: Not on file  . Marital Status: Not on file  Intimate Partner Violence:   . Fear of Current or Ex-Partner: Not on file  . Emotionally Abused: Not on file  . Physically Abused: Not on file  . Sexually Abused: Not on file   ROS  Review of Systems  Constitution: Negative for chills, decreased appetite, malaise/fatigue and weight gain.  Cardiovascular: Negative for dyspnea on exertion, leg swelling and syncope.  Endocrine: Negative for cold intolerance.  Hematologic/Lymphatic: Does not bruise/bleed easily.  Musculoskeletal: Negative for joint swelling.  Gastrointestinal: Negative for abdominal pain, anorexia, change in bowel habit, hematochezia and melena.  Neurological: Negative for headaches and light-headedness.  Psychiatric/Behavioral: Negative for depression and substance abuse.  All other systems reviewed and are negative.  Objective  Blood pressure Marland Kitchen(!)  165/84, pulse 88, temperature 98.9 F (37.2 C), resp. rate 14, height 5\' 7"  (1.702 m), weight 157 lb 12.8 oz (71.6 kg), SpO2 98 %.  Vitals with BMI 07/14/2019 07/13/2019 06/25/2019  Height 5\' 7"  5\' 7"  -  Weight 157 lbs 13 oz 160 lbs 6 oz -  BMI 29.51 88.41 -  Systolic 660 630 160  Diastolic 84 72 61  Pulse 88 68 70      Physical Exam  Constitutional: He appears well-developed and well-nourished.  HENT:  Head: Atraumatic.  Eyes: Conjunctivae are normal.  Neck: No JVD present. No thyromegaly present.   Cardiovascular: Normal rate, regular rhythm, normal heart sounds and intact distal pulses. Exam reveals no gallop.  No murmur heard. No leg edema, no JVD.  Pulmonary/Chest: Effort normal and breath sounds normal.  Abdominal: Soft. Bowel sounds are normal.  Musculoskeletal:        General: Normal range of motion.     Cervical back: Neck supple.  Neurological: He is alert.  Skin: Skin is warm and dry.  Psychiatric: He has a normal mood and affect.   Laboratory examination:    Recent Labs    06/07/19 0500 06/08/19 0435 06/25/19 2024  NA 143 140 138  K 3.1* 3.1* 3.7  CL 104 102 105  CO2 27 29 25   GLUCOSE 90 161* 254*  BUN 37* 30* 16  CREATININE 1.01 0.85 1.00  CALCIUM 8.2* 7.9* 8.7*  GFRNONAA >60 >60 >60  GFRAA >60 >60 >60   estimated creatinine clearance is 61.5 mL/min (by C-G formula based on SCr of 1 mg/dL).  CMP Latest Ref Rng & Units 06/25/2019 06/08/2019 06/07/2019  Glucose 70 - 99 mg/dL 254(H) 161(H) 90  BUN 8 - 23 mg/dL 16 30(H) 37(H)  Creatinine 0.61 - 1.24 mg/dL 1.00 0.85 1.01  Sodium 135 - 145 mmol/L 138 140 143  Potassium 3.5 - 5.1 mmol/L 3.7 3.1(L) 3.1(L)  Chloride 98 - 111 mmol/L 105 102 104  CO2 22 - 32 mmol/L 25 29 27   Calcium 8.9 - 10.3 mg/dL 8.7(L) 7.9(L) 8.2(L)  Total Protein 6.5 - 8.1 g/dL 6.6 5.7(L) 6.9  Total Bilirubin 0.3 - 1.2 mg/dL 0.4 0.6 0.9  Alkaline Phos 38 - 126 U/L 70 52 63  AST 15 - 41 U/L 18 34 59(H)  ALT 0 - 44 U/L 18 31 43   CBC Latest Ref Rng & Units 06/25/2019 06/08/2019 06/07/2019  WBC 4.0 - 10.5 K/uL 8.0 13.5(H) 15.2(H)  Hemoglobin 13.0 - 17.0 g/dL 11.1(L) 10.6(L) 11.8(L)  Hematocrit 39.0 - 52.0 % 34.1(L) 32.2(L) 36.1(L)  Platelets 150 - 400 K/uL 264 371 412(H)   Lipid Panel     Component Value Date/Time   TRIG 18 06/03/2019 1230   HEMOGLOBIN A1C Lab Results  Component Value Date   HGBA1C 8.1 (H) 06/04/2019   MPG 185.77 06/04/2019   TSH No results for input(s): TSH in the last 8760 hours. Medications and allergies    Allergies  Allergen Reactions  . Penicillins Anaphylaxis    Did it involve swelling of the face/tongue/throat, SOB, or low BP? Yes Did it involve sudden or severe rash/hives, skin peeling, or any reaction on the inside of your mouth or nose? No Did you need to seek medical attention at a hospital or doctor's office? No When did it last happen? If all above answers are "NO", may proceed with cephalosporin use.   . Trazodone And Nefazodone Other (See Comments)    Confusion, agitation, and hyperalert  . Ativan [  Lorazepam] Other (See Comments)    agitation   . Decongestant [Pseudoephedrine Hcl Er] Other (See Comments)    Elevated blood pressure   . Talwin [Pentazocine] Other (See Comments)    Elevated blood pressure.     Current Outpatient Medications  Medication Instructions  . acetaminophen (TYLENOL) 500 mg, Oral, Every 4 hours PRN  . bimatoprost (LUMIGAN) 0.03 % ophthalmic solution 1 drop, Both Eyes, Daily at bedtime  . fluticasone (VERAMYST) 27.5 MCG/SPRAY nasal spray 2 sprays, Nasal, Daily  . lisinopril-hydrochlorothiazide (ZESTORETIC) 20-25 MG tablet 1 tablet, Oral, Daily  . Melatonin 3 mg, Oral, Daily at bedtime  . metoprolol tartrate (LOPRESSOR) 25 mg, Oral, 2 times daily  . vitamin C (ASCORBIC ACID) 500 mg, Oral, Daily  . Zinc Citrate-Phytase 25-500 MG CAPS Oral    Radiology:  No results found. Cardiac Studies:   None  Assessment     ICD-10-CM   1. Essential hypertension  I10 EKG 12-Lead    EKG 07/14/2019: Normal sinus rhythm at the rate of 69 bpm, normal axis.  No evidence of ischemia, normal EKG.   Recommendations:  No orders of the defined types were placed in this encounter.   Ethan Pierce  is a 73 y.o. AAM with hypertension and DM, H/O COVID-19 pneumonia and admitted to the hospital on 06/02/2014 and received remdesivir and IV steroids, since then has been having shortness of breath and nasal congestion, decreased exercise tolerance and presented  to the ED on 06/25/2019 for chest pain and had negative high-sensitivity troponins x2.Cough and also chest pain.  He is asymptomatic today. Referred to me for further cardiac evaluation.   His blood pressure is elevated however previously he was on lisinopril HCT 20/25 mg once a day, this was inadvertently changed to plain lisinopril.  I given a prescription for HCT 25 mg as he has 90 pills with him.  Further refills his with his PCP on through [place].  In view of normal high sensitive serum troponin, normal EKG, I do not suspect patient has myocardial involvement and he is completely asymptomatic and is return to full activity without limitation.  There is no clinical evidence of heart failure.  Hence were ordered.  I'll see him back on a p.r.n. basis.  Ethan Decamp, MD, [facility] 07/14/2019, 1:41 PM [facility] [place] Pager: [phone] Office: [phone] If no answer Cell [phone]

## 2019-08-17 ENCOUNTER — Encounter: Payer: Medicare HMO | Admitting: Critical Care Medicine

## 2019-08-17 ENCOUNTER — Other Ambulatory Visit: Payer: Self-pay

## 2019-08-17 ENCOUNTER — Telehealth: Payer: Self-pay | Admitting: Critical Care Medicine

## 2019-08-17 NOTE — Progress Notes (Signed)
Opened in error.  Steffanie Dunn, DO 08/17/19 3:42 PM [facility]

## 2019-08-17 NOTE — Patient Instructions (Signed)
Thank you for visiting Dr. Adelisa Satterwhite at [facility]. We recommend the following: No orders of the defined types were placed in this encounter.  No orders of the defined types were placed in this encounter.   No orders of the defined types were placed in this encounter.   No follow-ups on file.    Please do your part to reduce the spread of COVID-19.   

## 2019-08-17 NOTE — Telephone Encounter (Signed)
Called and spoke to pt. Pt returning call. Pt missed his televisit with Beth but pt was rescheduled for tomorrow with Beth in office, this was relayed to pt. Pt verbalized understanding and denied any further questions or concerns at this time.

## 2019-08-18 ENCOUNTER — Ambulatory Visit: Payer: Medicare HMO | Admitting: Primary Care

## 2019-08-18 ENCOUNTER — Ambulatory Visit (INDEPENDENT_AMBULATORY_CARE_PROVIDER_SITE_OTHER): Payer: Medicare HMO

## 2019-08-18 ENCOUNTER — Telehealth: Payer: Self-pay | Admitting: Primary Care

## 2019-08-18 ENCOUNTER — Other Ambulatory Visit: Payer: Self-pay

## 2019-08-18 ENCOUNTER — Encounter: Payer: Self-pay | Admitting: Primary Care

## 2019-08-18 ENCOUNTER — Encounter: Payer: Self-pay | Admitting: *Deleted

## 2019-08-18 VITALS — BP 148/62 | HR 75 | Temp 97.9°F | Ht 67.0 in | Wt 163.2 lb

## 2019-08-18 DIAGNOSIS — J1282 Pneumonia due to coronavirus disease 2019: Secondary | ICD-10-CM

## 2019-08-18 DIAGNOSIS — R05 Cough: Secondary | ICD-10-CM

## 2019-08-18 DIAGNOSIS — Z09 Encounter for follow-up examination after completed treatment for conditions other than malignant neoplasm: Secondary | ICD-10-CM

## 2019-08-18 DIAGNOSIS — J00 Acute nasopharyngitis [common cold]: Secondary | ICD-10-CM

## 2019-08-18 DIAGNOSIS — Z8616 Personal history of COVID-19: Secondary | ICD-10-CM | POA: Diagnosis not present

## 2019-08-18 DIAGNOSIS — U071 COVID-19: Secondary | ICD-10-CM | POA: Diagnosis not present

## 2019-08-18 DIAGNOSIS — J069 Acute upper respiratory infection, unspecified: Secondary | ICD-10-CM | POA: Insufficient documentation

## 2019-08-18 DIAGNOSIS — K219 Gastro-esophageal reflux disease without esophagitis: Secondary | ICD-10-CM

## 2019-08-18 DIAGNOSIS — R0989 Other specified symptoms and signs involving the circulatory and respiratory systems: Secondary | ICD-10-CM | POA: Diagnosis not present

## 2019-08-18 MED ORDER — GUAIFENESIN 100 MG/5ML PO SOLN: 10.0000 mL | ORAL | 0 refills | Status: DC | PRN

## 2019-08-18 MED ORDER — DOXYCYCLINE CALCIUM 50 MG/5ML PO SYRP: 100.0000 mg | ORAL_SOLUTION | Freq: Two times a day (BID) | ORAL | 0 refills | Status: DC

## 2019-08-18 MED ORDER — AZITHROMYCIN 200 MG/5ML PO SUSR: ORAL | 0 refills | Status: DC

## 2019-08-18 MED FILL — AZITHROMYCIN 200 MG/5ML SUS: 200 | 5 days supply | Qty: 45 | Fill #0

## 2019-08-18 NOTE — Telephone Encounter (Signed)
Thanks Arlys John, Sorry I was on video visit I will address swallow eval

## 2019-08-18 NOTE — Assessment & Plan Note (Signed)
-   Resolved - CXR 08/18/2019 showed clear lungs; no active cardiopulmonary disease - No further follow-up needed

## 2019-08-18 NOTE — Assessment & Plan Note (Signed)
-   Patient has difficulty taking oral medication d.t GERD symptoms - Recommend follow-up with PCP, consider swallow eval

## 2019-08-18 NOTE — Telephone Encounter (Signed)
Spoke with the pt  He states that his insurance will not cover the doxy and wants alternative that comes in liquid  Has had zithromax in liquid in the past  Please advise thanks

## 2019-08-18 NOTE — Patient Instructions (Addendum)
Recommendations: Take guaifenesin 25ml every 4 hours as needed for cough/loosen phlegm  Start using flonase nasal spray 2 sprays once daily  Doxycycline liquid 59ml twice daily x 7 days if cough symptoms do not improve in 3-5 days  Orders: CXR today re: cough/post covid  Follow-up: Return if no improvement in cough     Acute Bronchitis, Adult  Acute bronchitis is when air tubes in the lungs (bronchi) suddenly get swollen. The condition can make it hard for you to breathe. In adults, acute bronchitis usually goes away within 2 weeks. A cough caused by bronchitis may last up to 3 weeks. Smoking, allergies, and asthma can make the condition worse. What are the causes? This condition is caused by:  Cold and flu viruses. The most common cause of this condition is the virus that causes the common cold.  Bacteria.  Substances that irritate the lungs, including: ? Smoke from cigarettes and other types of tobacco. ? Dust and pollen. ? Fumes from chemicals, gases, or burned fuel. ? Other materials that pollute indoor or outdoor air.  Close contact with someone who has acute bronchitis. What increases the risk? The following factors may make you more likely to develop this condition:  A weak body's defense system. This is also called the immune system.  Any condition that affects your lungs and breathing, such as asthma. What are the signs or symptoms? Symptoms of this condition include:  A cough.  Coughing up clear, yellow, or green mucus.  Wheezing.  Chest congestion.  Shortness of breath.  A fever.  Body aches.  Chills.  A sore throat. How is this treated? Acute bronchitis may go away over time without treatment. Your doctor may recommend:  Drinking more fluids.  Taking a medicine for a fever or cough.  Using a device that gets medicine into your lungs (inhaler).  Using a vaporizer or a humidifier. These are machines that add water or moisture in the air to  help with coughing and poor breathing. Follow these instructions at home:  Activity  Get a lot of rest.  Avoid places where there are fumes from chemicals.  Return to your normal activities as told by your doctor. Ask your doctor what activities are safe for you. Lifestyle  Drink enough fluids to keep your pee (urine) pale yellow.  Do not drink alcohol.  Do not use any products that contain nicotine or tobacco, such as cigarettes, e-cigarettes, and chewing tobacco. If you need help quitting, ask your doctor. Be aware that: ? Your bronchitis will get worse if you smoke or breathe in other people's smoke (secondhand smoke). ? Your lungs will heal faster if you quit smoking. General instructions  Take over-the-counter and prescription medicines only as told by your doctor.  Use an inhaler, cool mist vaporizer, or humidifier as told by your doctor.  Rinse your mouth often with salt water. To make salt water, dissolve -1 tsp (3-6 g) of salt in 1 cup (237 mL) of warm water.  Keep all follow-up visits as told by your doctor. This is important. How is this prevented? To lower your risk of getting this condition again:  Wash your hands often with soap and water. If soap and water are not available, use hand sanitizer.  Avoid contact with people who have cold symptoms.  Try not to touch your mouth, nose, or eyes with your hands.  Make sure to get the flu shot every year. Contact a doctor if:  Your symptoms do not  get better in 2 weeks.  You vomit more than once or twice.  You have symptoms of loss of fluid from your body (dehydration). These include: ? Dark urine. ? Dry skin or eyes. ? Increased thirst. ? Headaches. ? Confusion. ? Muscle cramps. Get help right away if:  You cough up blood.  You have chest pain.  You have very bad shortness of breath.  You become dehydrated.  You faint or keep feeling like you are going to faint.  You keep vomiting.  You have a  very bad headache.  Your fever or chills get worse. These symptoms may be an emergency. Do not wait to see if the symptoms will go away. Get medical help right away. Call your local emergency services (911 in the [place] Do not drive yourself to the hospital. Summary  Acute bronchitis is when air tubes in the lungs (bronchi) suddenly get swollen. In adults, acute bronchitis usually goes away within 2 weeks.  Take over-the-counter and prescription medicines only as told by your doctor.  Drink enough fluid to keep your pee (urine) pale yellow.  Contact a doctor if your symptoms do not improve after 2 weeks of treatment.  Get help right away if you cough up blood, faint, or have chest pain or shortness of breath. This information is not intended to replace advice given to you by your health care provider. Make sure you discuss any questions you have with your health care provider. Document Revised: 02/10/2019 Document Reviewed: 02/10/2019 Elsevier Patient Education  2020 [employer].

## 2019-08-18 NOTE — Telephone Encounter (Signed)
08/18/2019 1705  Unsure why the patient cannot swallow pills.  As patient has pills on his med list.  Per previous chart review patient cannot tolerate azithromycin in liquid form which she was treated with from the emergency room in November/2020.  We have placed a similar prescription now.  We will route to [facility] as well as Dr. Chestine Spore as if patient is having difficulty swallowing pills may need to consider a swallow evaluation.  Or could recommend to primary care.  Prescription sent.   Elisha Headland, FNP

## 2019-08-18 NOTE — Telephone Encounter (Signed)
Spoke with the pt  He states that he does not have any trouble swallowing, he has GERD and this is why he needs to have the liquid form of abx  He is aware that we sent the liquid zithromax  I called [facility] and cancelled the Doxy   Will forward to [facility] and Dr Chestine Spore per Brian's request

## 2019-08-18 NOTE — Telephone Encounter (Signed)
Spoke with the pharmacist at [facility]  She states they do not have the liquid Doxy in stock She is asking if he can take the capsule- open up and mix powder with apple sauce or food  Is this okay?

## 2019-08-18 NOTE — Assessment & Plan Note (Addendum)
-   Symptoms consistent with acute URI, recommend symptomatic management - RX guaifenesin 37ml q 4 hours for cough/congestion; resume flonase nasal spray daily - Take azithromycin if symptoms for not improve in 3-5 days

## 2019-08-18 NOTE — Telephone Encounter (Signed)
Pt calling regarding antibiotic that was called in not being covered by insurance.  Please advise.  [phone]

## 2019-08-18 NOTE — Progress Notes (Signed)
@Patient  ID: , male    DOB: 08-31-1945, 74 y.o.   MRN: [id number]  Chief Complaint  Patient presents with  . Follow-up    Referring provider: [id number], MD   Synopsis: Referred in December 2020 for post-COVID by January 2021, MD.  HPI: 74 year old male, never smoked.  Past medical history significant for pneumonia due to COVID-19, acute respiratory failure with hypoxia, sepsis due to pneumonia, hypertension, GERD, diabetes type 2.  Patient of Dr. 65, seen for initial consult in December 2020. Received influenza vaccine on 07/13/19. Pulmonary symptoms felt to be resolved. No further follow-up needed  Previous [facility] pulmonary encounter: 07/13/19- Consult, Dr. 14/10/20  Ethan Pierce is a 74 year old gentleman who presents for evaluation after having COVID-19 viral pneumonia.  He was diagnosed in late October and admitted to [facility] from 10/31-11/5.  He received remdesivir and IV steroids while in the hospital.  He did not require supplemental oxygen at the time of discharge.  He has been having ongoing nasal congestion and shortness of breath at home, and was treated with azithromycin for 5 days on 11/22 after being seen in the emergency department.  He was also started on Zyrtec recently.  He denies shortness of breath or dyspnea on exertion.  His cough has improved, and only occurs periodically in the evenings, but not overnight.  He has noticed that his heart rate is higher when he is exercising, but recovers when he is resting.  His endurance is improving and he is regaining weight that he had lost.  He has been checking his oxygen saturations at home and they have not been dropping.  He never smoked or vaped.  He denies other new complaints.  He has not yet had his flu vaccine or pneumonia vaccines.  08/18/2019 Patient presents today for acute visit. Reports head/sinus pressure, chest congested x 4-5 days. Reports sweats today, may be d/t wearing two masks. Nasal  congestion is clear. Cough is non-productive. Feels he has to clear this throat often throughout the day. He has not been using fluticasone nasal spray as he thought it was just for allergy symptoms. He was taking Tussinex for cough. He has difficulty taking pills d/t GERD symptoms. Not currently taking an expectorant.   Chest Imaging- films reviewed: CXR, 1 view 06/25/2019-resolving right and left lower lobe peripheral infiltrates in comparison to 06/03/2019 x-ray  Pulmonary Functions Testing Results: No flowsheet data found.  Allergies  Allergen Reactions  . Penicillins Anaphylaxis    Did it involve swelling of the face/tongue/throat, SOB, or low BP? Yes Did it involve sudden or severe rash/hives, skin peeling, or any reaction on the inside of your mouth or nose? No Did you need to seek medical attention at a hospital or doctor's office? No When did it last happen? If all above answers are "NO", may proceed with cephalosporin use.   . Trazodone And Nefazodone Other (See Comments)    Confusion, agitation, and hyperalert  . Ativan [Lorazepam] Other (See Comments)    agitation   . Decongestant [Pseudoephedrine Hcl Er] Other (See Comments)    Elevated blood pressure   . Talwin [Pentazocine] Other (See Comments)    Elevated blood pressure.    Immunization History  Administered Date(s) Administered  . Fluad Quad(high Dose 65+) 07/13/2019    Past Medical History:  Diagnosis Date  . Bronchitis   . Diabetes mellitus without complication (HCC)   . GERD (gastroesophageal reflux disease)   . History of 2019 novel  coronavirus disease (COVID-19) 05/2019  . Hypertension     Tobacco History: Social History   Tobacco Use  Smoking Status Never Smoker  Smokeless Tobacco Never Used   Counseling given: Not Answered   Outpatient Medications Prior to Visit  Medication Sig Dispense Refill  . acetaminophen (TYLENOL) 160 MG/5ML elixir Take 15.6 mLs (500 mg total) by mouth every 4  (four) hours as needed for fever. 473 mL 2  . bimatoprost (LUMIGAN) 0.03 % ophthalmic solution Place 1 drop into both eyes at bedtime.    . fluticasone (VERAMYST) 27.5 MCG/SPRAY nasal spray Place 2 sprays into the nose daily.    . hydrochlorothiazide (HYDRODIURIL) 25 MG tablet Take 1 tablet (25 mg total) by mouth every morning. 90 tablet 0  . lisinopril-hydrochlorothiazide (ZESTORETIC) 20-25 MG tablet Take 1 tablet by mouth daily.    . Melatonin 3 MG TABS Take 3 mg by mouth at bedtime.    . metoprolol tartrate (LOPRESSOR) 25 MG tablet Take 1 tablet (25 mg total) by mouth 2 (two) times daily. 60 tablet 3  . vitamin C (ASCORBIC ACID) 500 MG tablet Take 500 mg by mouth daily.    . Zinc Citrate-Phytase 25-500 MG CAPS Take by mouth.     No facility-administered medications prior to visit.   Review of Systems  Review of Systems  Constitutional: Positive for diaphoresis. Negative for chills and fever.  HENT: Positive for congestion, postnasal drip and sinus pressure.   Respiratory: Positive for cough. Negative for chest tightness.    Physical Exam  BP (!) 148/62 (BP Location: Left Arm, Cuff Size: Normal)   Pulse 75   Temp 97.9 F (36.6 C) (Temporal)   Ht 5\' 7"  (1.702 m)   Wt 163 lb 3.2 oz (74 kg)   SpO2 100%   BMI 25.56 kg/m  Physical Exam Constitutional:      General: He is not in acute distress.    Appearance: Normal appearance. He is not ill-appearing.  HENT:     Head: Normocephalic and atraumatic.     Mouth/Throat:     Mouth: Mucous membranes are moist.     Pharynx: Oropharynx is clear.  Cardiovascular:     Rate and Rhythm: Normal rate and regular rhythm.  Pulmonary:     Effort: Pulmonary effort is normal.     Breath sounds: Normal breath sounds.  Musculoskeletal:        General: Normal range of motion.  Skin:    General: Skin is warm and dry.  Neurological:     General: No focal deficit present.     Mental Status: He is alert and oriented to person, place, and time.  Mental status is at baseline.  Psychiatric:        Mood and Affect: Mood normal.        Behavior: Behavior normal.        Thought Content: Thought content normal.        Judgment: Judgment normal.      Lab Results:  CBC    Component Value Date/Time   WBC 8.0 06/25/2019 2024   RBC 3.60 (L) 06/25/2019 2024   HGB 11.1 (L) 06/25/2019 2024   HCT 34.1 (L) 06/25/2019 2024   PLT 264 06/25/2019 2024   MCV 94.7 06/25/2019 2024   MCH 30.8 06/25/2019 2024   MCHC 32.6 06/25/2019 2024   RDW 13.6 06/25/2019 2024   LYMPHSABS 1.5 06/08/2019 0435   MONOABS 1.1 (H) 06/08/2019 0435   EOSABS 0.0 06/08/2019 0435  BASOSABS 0.0 06/08/2019 0435    BMET    Component Value Date/Time   NA 138 06/25/2019 2024   K 3.7 06/25/2019 2024   CL 105 06/25/2019 2024   CO2 25 06/25/2019 2024   GLUCOSE 254 (H) 06/25/2019 2024   BUN 16 06/25/2019 2024   CREATININE 1.00 06/25/2019 2024   CALCIUM 8.7 (L) 06/25/2019 2024   GFRNONAA >60 06/25/2019 2024   GFRAA >60 06/25/2019 2024    BNP    Component Value Date/Time   BNP 15.2 01/12/2015 0555    ProBNP    Component Value Date/Time   PROBNP 14.5 07/17/2014 0441    Imaging: DG Chest 2 View  Result Date: 08/18/2019 CLINICAL DATA:  Follow-up COVID EXAM: CHEST - 2 VIEW COMPARISON:  06/25/2019 FINDINGS: Heart is borderline in size. Lungs clear. No effusions. No acute bony abnormality. IMPRESSION: No active cardiopulmonary disease. Electronically Signed   By: Rolm Baptise M.D.   On: 08/18/2019 15:03     Assessment & Plan:   URI (upper respiratory infection) - Symptoms consistent with acute URI, recommend symptomatic management - RX guaifenesin 14ml q 4 hours for cough/congestion; resume flonase nasal spray daily - Take azithromycin if symptoms for not improve in 3-5 days   Pneumonia due to COVID-19 virus - Resolved - CXR 08/18/2019 showed clear lungs; no active cardiopulmonary disease - No further follow-up needed  GERD (gastroesophageal reflux  disease) - Patient has difficulty taking oral medication d.t GERD symptoms - Recommend follow-up with PCP, consider swallow eval    Ethan Ehrich, NP 08/18/2019

## 2019-08-21 NOTE — Telephone Encounter (Signed)
Thanks  Brian

## 2019-08-22 MED FILL — CHLORHEXIDINE 0.12% RINSE: 0.12 | 16 days supply | Qty: 473 | Fill #0

## 2019-08-22 MED FILL — traMADol HCL 50 MG TABS: 50 | 2 days supply | Qty: 8 | Fill #0

## 2019-09-20 ENCOUNTER — Ambulatory Visit: Payer: Medicare HMO | Admitting: Podiatry

## 2019-11-01 ENCOUNTER — Ambulatory Visit: Payer: Medicare HMO | Admitting: Podiatry

## 2019-11-29 MED FILL — oxyCODONE HCL 5 MG TABS: 5 | 2 days supply | Qty: 12 | Fill #0

## 2020-01-09 ENCOUNTER — Ambulatory Visit: Payer: Medicare HMO | Admitting: Podiatry

## 2020-03-06 DIAGNOSIS — H524 Presbyopia: Secondary | ICD-10-CM | POA: Diagnosis not present

## 2020-03-06 DIAGNOSIS — H52223 Regular astigmatism, bilateral: Secondary | ICD-10-CM | POA: Diagnosis not present

## 2020-03-08 ENCOUNTER — Ambulatory Visit (INDEPENDENT_AMBULATORY_CARE_PROVIDER_SITE_OTHER): Payer: Medicare HMO

## 2020-03-08 ENCOUNTER — Ambulatory Visit: Payer: Medicare HMO | Admitting: Pulmonary Disease

## 2020-03-08 ENCOUNTER — Ambulatory Visit (INDEPENDENT_AMBULATORY_CARE_PROVIDER_SITE_OTHER): Payer: Medicare HMO | Admitting: Adult Health

## 2020-03-08 ENCOUNTER — Encounter: Payer: Self-pay | Admitting: Adult Health

## 2020-03-08 ENCOUNTER — Other Ambulatory Visit: Payer: Self-pay

## 2020-03-08 VITALS — BP 124/84 | HR 74 | Temp 97.3°F | Ht 68.0 in | Wt 160.4 lb

## 2020-03-08 DIAGNOSIS — U071 COVID-19: Secondary | ICD-10-CM

## 2020-03-08 DIAGNOSIS — J40 Bronchitis, not specified as acute or chronic: Secondary | ICD-10-CM

## 2020-03-08 DIAGNOSIS — J1282 Pneumonia due to coronavirus disease 2019: Secondary | ICD-10-CM

## 2020-03-08 NOTE — Patient Instructions (Addendum)
Advance activity as tolerated.  [facility]  Follow up with ENT as planned.  Add Saline nasal Twice daily   Continue Veramyst 1 puff daily.  Chest xray today .  Follow up with Pulmonary as needed.

## 2020-03-08 NOTE — Progress Notes (Signed)
@Patient  ID: , male    DOB: 07-11-1946, 74 y.o.   MRN: [id number]  Chief Complaint  Patient presents with  . Follow-up    pna     Referring provider: [id number], MD  HPI: 74 yo male never smoker seen after developed COVID 19 infection with COVID Pneumonia 05/2019  TEST/EVENTS :  CXR 08/2019 Lung clear    CXR 05/2019 Bilateral opacities   03/08/2020 Follow up : COVID 19 PNA  Patient returns for 6 month follow up. Was seen initially after COVID 19 infection and PNA in October 2020 . He has progressively improved since then with resolution of cough . He was referred to pulmonary rehab by the [facility] system which seemed to help increase his strength. Denies cough . Does get winded with heavy activity . Does request a chest xray today as wants to make sure it has remained clear.   Does complain of nasal congesiton and stuffiness. Has ENT referral coming up . Remains on veramyst but not sure it helps much .    Allergies  Allergen Reactions  . Penicillins Anaphylaxis    Did it involve swelling of the face/tongue/throat, SOB, or low BP? Yes Did it involve sudden or severe rash/hives, skin peeling, or any reaction on the inside of your mouth or nose? No Did you need to seek medical attention at a hospital or doctor's office? No When did it last happen? If all above answers are "NO", may proceed with cephalosporin use.   . Trazodone And Nefazodone Other (See Comments)    Confusion, agitation, and hyperalert  . Ativan [Lorazepam] Other (See Comments)    agitation   . Decongestant [Pseudoephedrine Hcl Er] Other (See Comments)    Elevated blood pressure   . Talwin [Pentazocine] Other (See Comments)    Elevated blood pressure.    Immunization History  Administered Date(s) Administered  . Fluad Quad(high Dose 65+) 07/13/2019  . Janssen (J&J) SARS-COV-2 Vaccination 11/01/2019    Past Medical History:  Diagnosis Date  . Bronchitis   . Diabetes mellitus without  complication (HCC)   . GERD (gastroesophageal reflux disease)   . History of 2019 novel coronavirus disease (COVID-19) 05/2019  . Hypertension     Tobacco History: Social History   Tobacco Use  Smoking Status Never Smoker  Smokeless Tobacco Never Used   Counseling given: Not Answered   Outpatient Medications Prior to Visit  Medication Sig Dispense Refill  . acetaminophen (TYLENOL) 160 MG/5ML elixir Take 15.6 mLs (500 mg total) by mouth every 4 (four) hours as needed for fever. 473 mL 2  . bimatoprost (LUMIGAN) 0.03 % ophthalmic solution Place 1 drop into both eyes at bedtime.    . fluticasone (VERAMYST) 27.5 MCG/SPRAY nasal spray Place 2 sprays into the nose daily.    06/2019 guaiFENesin (ROBITUSSIN) 100 MG/5ML SOLN Take 10 mLs (200 mg total) by mouth every 4 (four) hours as needed for cough or to loosen phlegm. 300 mL 0  . lisinopril-hydrochlorothiazide (ZESTORETIC) 20-25 MG tablet Take 1 tablet by mouth daily.    . Melatonin 3 MG TABS Take 3 mg by mouth at bedtime.    . metoprolol tartrate (LOPRESSOR) 25 MG tablet Take 1 tablet (25 mg total) by mouth 2 (two) times daily. 60 tablet 3  . vitamin C (ASCORBIC ACID) 500 MG tablet Take 500 mg by mouth daily.    . Zinc Citrate-Phytase 25-500 MG CAPS Take by mouth.    . hydrochlorothiazide (HYDRODIURIL) 25 MG tablet  Take 1 tablet (25 mg total) by mouth every morning. 90 tablet 0  . azithromycin (ZITHROMAX) 200 MG/5ML suspension Take 12.5 ml's on the first day, then take 7 ml's per day for the next 4 days 40.5 mL 0   No facility-administered medications prior to visit.     Review of Systems:   Constitutional:   No  weight loss, night sweats,  Fevers, chills, fatigue, or  lassitude.  HEENT:   No headaches,  Difficulty swallowing,  Tooth/dental problems, or  Sore throat,                No sneezing, itching, ear ache,  +nasal congestion, post nasal drip,   CV:  No chest pain,  Orthopnea, PND, swelling in lower extremities, anasarca,  dizziness, palpitations, syncope.   GI  No heartburn, indigestion, abdominal pain, nausea, vomiting, diarrhea, change in bowel habits, loss of appetite, bloody stools.   Resp: No shortness of breath with exertion or at rest.  No excess mucus, no productive cough,  No non-productive cough,  No coughing up of blood.  No change in color of mucus.  No wheezing.  No chest wall deformity  Skin: no rash or lesions.  GU: no dysuria, change in color of urine, no urgency or frequency.  No flank pain, no hematuria   MS:  No joint pain or swelling.  No decreased range of motion.  No back pain.    Physical Exam  BP 124/84 (BP Location: Left Arm, Cuff Size: Normal)   Pulse 74   Temp (!) 97.3 F (36.3 C) (Oral)   Ht 5\' 8"  (1.727 m)   Wt 160 lb 6.4 oz (72.8 kg)   SpO2 99%   BMI 24.39 kg/m   GEN: A/Ox3; pleasant , NAD, well nourished    HEENT:  [place]/AT,   NOSE-clear, THROAT-clear, no lesions, no postnasal drip or exudate noted.   NECK:  Supple w/ fair ROM; no JVD; normal carotid impulses w/o bruits; no thyromegaly or nodules palpated; no lymphadenopathy.    RESP  Clear  P & A; w/o, wheezes/ rales/ or rhonchi. no accessory muscle use, no dullness to percussion  CARD:  RRR, no m/r/g, no peripheral edema, pulses intact, no cyanosis or clubbing.  GI:   Soft & nt; nml bowel sounds; no organomegaly or masses detected.   Musco: Warm bil, no deformities or joint swelling noted.   Neuro: alert, no focal deficits noted.    Skin: Warm, no lesions or rashes    Lab Results:  CBC  BMET   BNP  Imaging: DG Chest 2 View  Result Date: 03/08/2020 CLINICAL DATA:  Bronchitis. EXAM: CHEST - 2 VIEW COMPARISON:  August 18, 2019. FINDINGS: The heart size and mediastinal contours are within normal limits. Both lungs are clear. The visualized skeletal structures are unremarkable. IMPRESSION: No active cardiopulmonary disease. Electronically Signed   By: August 20, 2019 M.D.   On: 03/08/2020 13:18       No flowsheet data found.  No results found for: NITRICOXIDE      Assessment & Plan:   Pneumonia due to COVID-19 virus Appears to have fully recovered. Previous CXR showed clearance .  Xray today per patient request.  Activity as tolerated.  F/up with pulmonary As needed    Plan  Patient Instructions  Advance activity as tolerated.  [facility]  Follow up with ENT as planned.  Add Saline nasal Twice daily   Continue Veramyst 1 puff  daily.  Chest xray today .  Follow up with Pulmonary as needed.           Rubye Oaks, NP 03/08/2020

## 2020-03-08 NOTE — Assessment & Plan Note (Signed)
Appears to have fully recovered. Previous CXR showed clearance .  Xray today per patient request.  Activity as tolerated.  F/up with pulmonary As needed    Plan  Patient Instructions  Advance activity as tolerated.  [facility]  Follow up with ENT as planned.  Add Saline nasal Twice daily   Continue Veramyst 1 puff daily.  Chest xray today .  Follow up with Pulmonary as needed.

## 2020-03-15 ENCOUNTER — Ambulatory Visit (INDEPENDENT_AMBULATORY_CARE_PROVIDER_SITE_OTHER): Payer: Medicare HMO | Admitting: Otolaryngology

## 2020-03-15 ENCOUNTER — Other Ambulatory Visit: Payer: Self-pay

## 2020-03-15 ENCOUNTER — Encounter (INDEPENDENT_AMBULATORY_CARE_PROVIDER_SITE_OTHER): Payer: Self-pay | Admitting: Otolaryngology

## 2020-03-15 VITALS — Temp 97.7°F

## 2020-03-15 DIAGNOSIS — J310 Chronic rhinitis: Secondary | ICD-10-CM | POA: Diagnosis not present

## 2020-03-15 DIAGNOSIS — H6123 Impacted cerumen, bilateral: Secondary | ICD-10-CM

## 2020-03-15 NOTE — Progress Notes (Signed)
HPI: Ethan Pierce is a 74 y.o. male who returns today for evaluation of ear and sinus complaints.  He has a previous history of Mnire's disease for which she is treated at the [place] system.  More recently he has complained of a fullness feeling in the left ear.  He also has sinus problems and describes balance problems although he does not describe vertigo.  He was recently seen at the [facility] system and gets all his treatment and most of his medications from the [place] system.  He is presently on Flonase.  He also uses saline rinses.  He has not been on any antibiotics..  Past Medical History:  Diagnosis Date  . Bronchitis   . Diabetes mellitus without complication (HCC)   . GERD (gastroesophageal reflux disease)   . History of 2019 novel coronavirus disease (COVID-19) 05/2019  . Hypertension    Past Surgical History:  Procedure Laterality Date  . CATARACT EXTRACTION, BILATERAL  2018, 2019  . WISDOM TOOTH EXTRACTION  05/2019   Social History   Socioeconomic History  . Marital status: Married    Spouse name: Not on file  . Number of children: 2  . Years of education: Not on file  . Highest education level: Not on file  Occupational History  . Not on file  Tobacco Use  . Smoking status: Never Smoker  . Smokeless tobacco: Never Used  Vaping Use  . Vaping Use: Never used  Substance and Sexual Activity  . Alcohol use: No  . Drug use: No  . Sexual activity: Yes  Other Topics Concern  . Not on file  Social History Narrative  . Not on file   Social Determinants of Health   Financial Resource Strain:   . Difficulty of Paying Living Expenses:   Food Insecurity:   . Worried About Programme researcher, broadcasting/film/video in the Last Year:   . Barista in the Last Year:   Transportation Needs:   . Freight forwarder (Medical):   Marland Kitchen Lack of Transportation (Non-Medical):   Physical Activity:   . Days of Exercise per Week:   . Minutes of Exercise per Session:   Stress:   . Feeling of Stress :    Social Connections:   . Frequency of Communication with Friends and Family:   . Frequency of Social Gatherings with Friends and Family:   . Attends Religious Services:   . Active Member of Clubs or Organizations:   . Attends Banker Meetings:   Marland Kitchen Marital Status:    Family History  Problem Relation Age of Onset  . Diabetes Mother   . GER disease Mother   . Cancer Father        pancreas  . Diabetes Sister   . Diabetes Brother    Allergies  Allergen Reactions  . Penicillins Anaphylaxis    Did it involve swelling of the face/tongue/throat, SOB, or low BP? Yes Did it involve sudden or severe rash/hives, skin peeling, or any reaction on the inside of your mouth or nose? No Did you need to seek medical attention at a hospital or doctor's office? No When did it last happen? If all above answers are "NO", may proceed with cephalosporin use.   . Trazodone And Nefazodone Other (See Comments)    Confusion, agitation, and hyperalert  . Ativan [Lorazepam] Other (See Comments)    agitation   . Decongestant [Pseudoephedrine Hcl Er] Other (See Comments)    Elevated blood pressure   .  Talwin [Pentazocine] Other (See Comments)    Elevated blood pressure.   Prior to Admission medications   Medication Sig Start Date End Date Taking? Authorizing Provider  acetaminophen (TYLENOL) 160 MG/5ML elixir Take 15.6 mLs (500 mg total) by mouth every 4 (four) hours as needed for fever. 05/29/19  Yes Pfeiffer, Lebron Conners, MD  bimatoprost (LUMIGAN) 0.03 % ophthalmic solution Place 1 drop into both eyes at bedtime.   Yes [provider]  fluticasone (VERAMYST) 27.5 MCG/SPRAY nasal spray Place 2 sprays into the nose daily.   Yes [provider]  guaiFENesin (ROBITUSSIN) 100 MG/5ML SOLN Take 10 mLs (200 mg total) by mouth every 4 (four) hours as needed for cough or to loosen phlegm. 08/18/19  Yes Glenford Bayley, NP  lisinopril-hydrochlorothiazide (ZESTORETIC) 20-25 MG tablet  Take 1 tablet by mouth daily.   Yes [provider]  Melatonin 3 MG TABS Take 3 mg by mouth at bedtime.   Yes [provider]  metoprolol tartrate (LOPRESSOR) 25 MG tablet Take 1 tablet (25 mg total) by mouth 2 (two) times daily. 06/08/19  Yes Marinda Elk, MD  vitamin C (ASCORBIC ACID) 500 MG tablet Take 500 mg by mouth daily.   Yes [provider]  Zinc Citrate-Phytase 25-500 MG CAPS Take by mouth.   Yes [provider]  hydrochlorothiazide (HYDRODIURIL) 25 MG tablet Take 1 tablet (25 mg total) by mouth every morning. 07/14/19 10/12/19  Yates Decamp, MD     Positive ROS: Otherwise negative  All other systems have been reviewed and were otherwise negative with the exception of those mentioned in the HPI and as above.  Physical Exam: Constitutional: Alert, well-appearing, no acute distress Ears: External ears without lesions or tenderness.  He has wax buildup in both ears but on the left side he has some wax that is fallen down adjacent to the left TM.  This was removed in the office today using forceps and suction.  The TMs were otherwise clear.  Hearing and ear felt better following cleaning the ears.  Dix-Hallpike testing revealed no evidence of BPPV. Nasal: External nose without lesions. Septum with minimal deformity and mild rhinitis..  Both middle meatus regions were clear with no signs of active infection no polyps noted.  Does have mild rhinitis with mild thick mucus discharge. Oral: Lips and gums without lesions. Tongue and palate mucosa without lesions. Posterior oropharynx clear. Neck: No palpable adenopathy or masses Respiratory: Breathing comfortably  Skin: No facial/neck lesions or rash noted.  Cerumen impaction removal  Date/Time: 03/15/2020 5:03 PM Performed by: Drema Halon, MD Authorized by: Drema Halon, MD   Consent:    Consent obtained:  Verbal   Consent given by:  Patient   Risks discussed:  Pain and  bleeding Procedure details:    Location:  L ear and R ear   Procedure type: curette, suction and forceps   Post-procedure details:    Inspection:  TM intact and canal normal   Hearing quality:  Improved   Patient tolerance of procedure:  Tolerated well, no immediate complications Comments:     TMs are clear bilaterally.    Assessment: Chronic rhinitis Cerumen buildup in both ears worse on the left side.  Plan: He will follow-up as needed   Narda Bonds, MD

## 2020-05-01 IMAGING — MR MR HEAD W/O CM
2 series · 48 of 48 positions shown · non-contrast
Comparison: Head CT 11/26/2013

CLINICAL DATA: Fever.  Near syncopal episode.

EXAM:
MRI HEAD WITHOUT CONTRAST
TECHNIQUE: Multiplanar, multiecho pulse sequences of the brain and surrounding
structures were obtained without intravenous contrast.

[Series 5: DWI · axial · 3.0mm · 0.88mm/px · z∈[-57,+83]mm · 32 of 96 slices shown (1 of 2)]
[im 1/96]
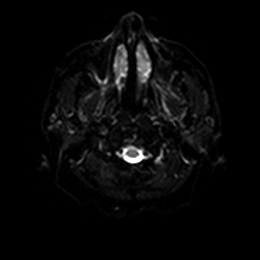
[im 4/96]
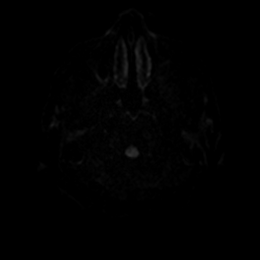
[im 7/96]
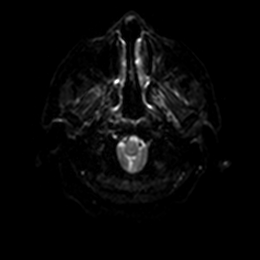
[im 10/96]
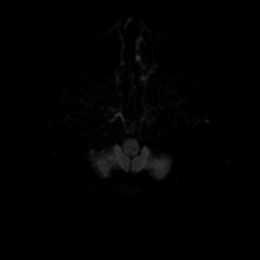
[im 13/96]
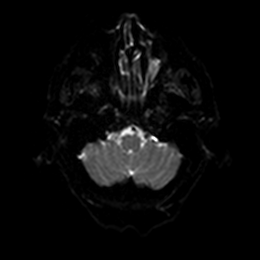
[im 16/96]
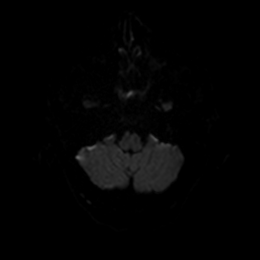
[im 19/96]
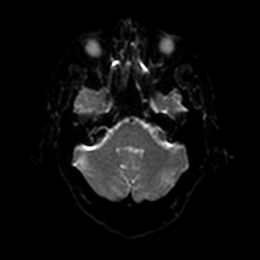
[im 22/96]
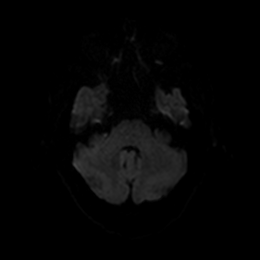
[im 25/96]
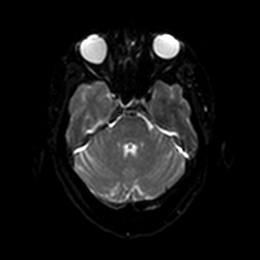
[im 28/96]
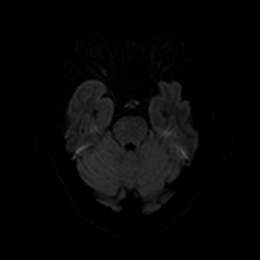
[im 31/96]
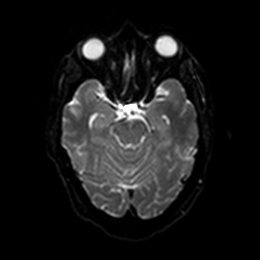
[im 34/96]
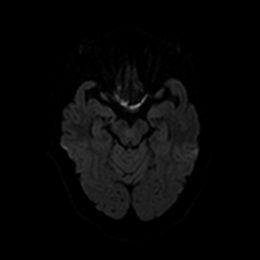
[im 37/96]
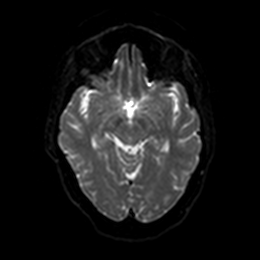
[im 40/96]
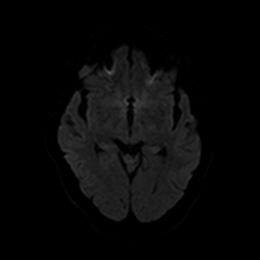
[im 43/96]
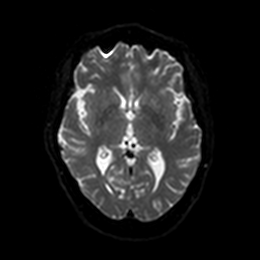
[im 46/96]
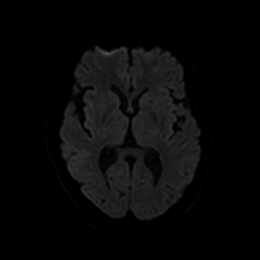
[im 50/96]
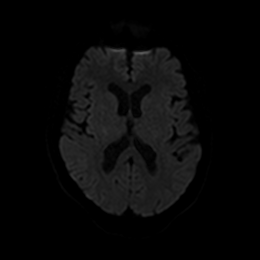
[im 53/96]
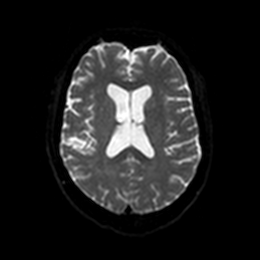
[im 56/96]
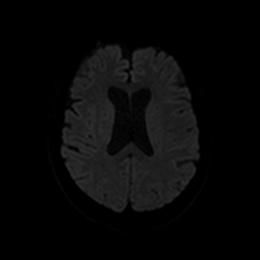
[im 59/96]
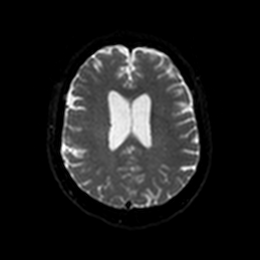
[im 62/96]
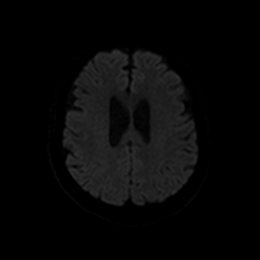
[im 65/96]
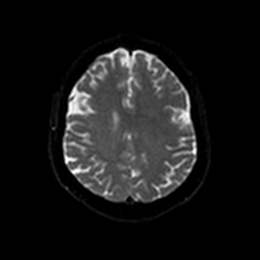
[im 68/96]
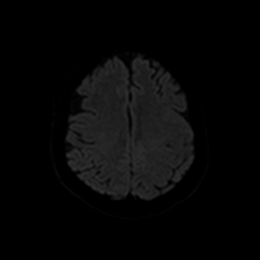
[im 71/96]
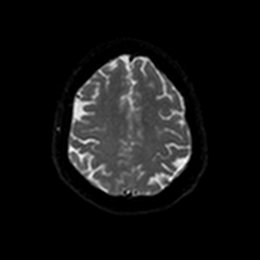
[im 74/96]
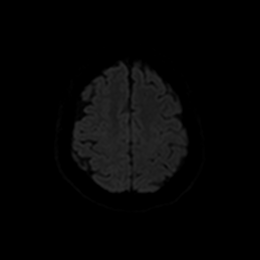
[im 77/96]
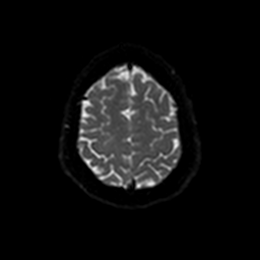
[im 80/96]
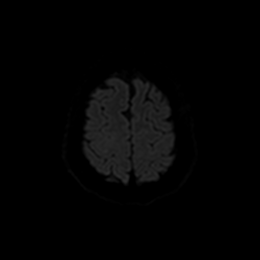
[im 83/96]
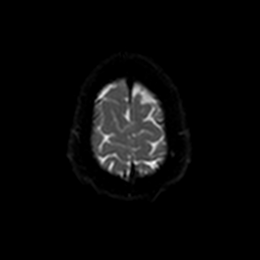
[im 86/96]
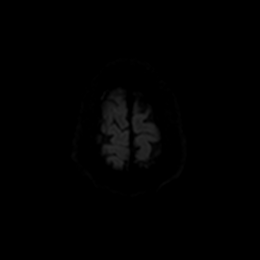
[im 89/96]
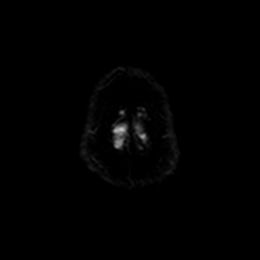
[im 92/96]
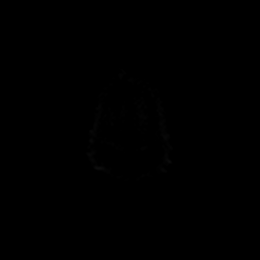
[im 96/96]
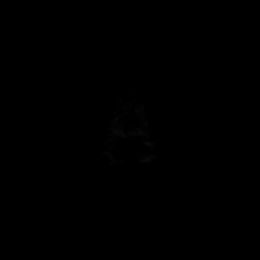

[Series 6: DWI · axial · 3.0mm · 0.88mm/px · z∈[-57,+83]mm · 16 of 48 slices shown (2 of 2)]
[im 1/48]
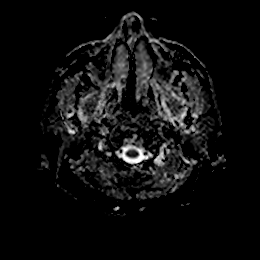
[im 4/48]
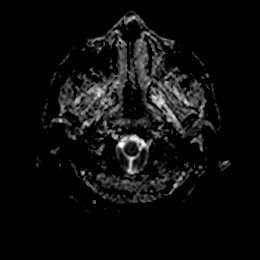
[im 7/48]
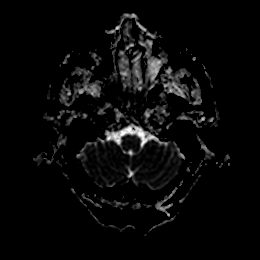
[im 10/48]
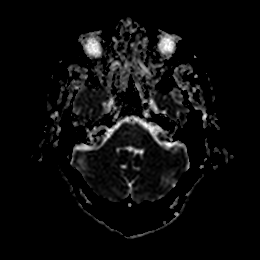
[im 13/48]
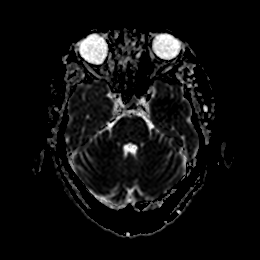
[im 16/48]
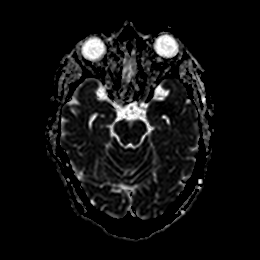
[im 19/48]
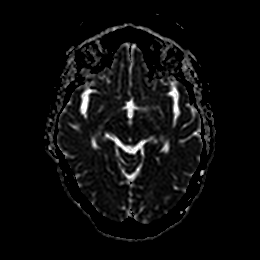
[im 22/48]
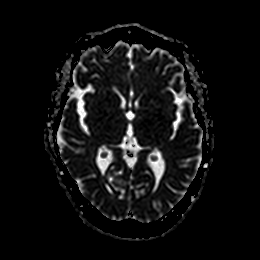
[im 26/48]
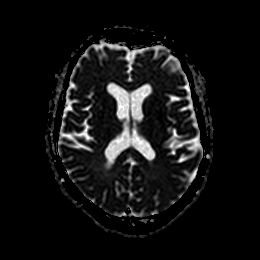
[im 29/48]
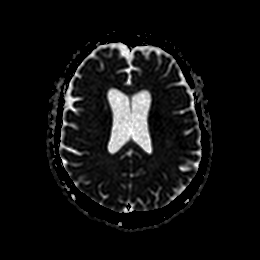
[im 32/48]
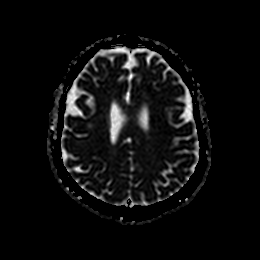
[im 35/48]
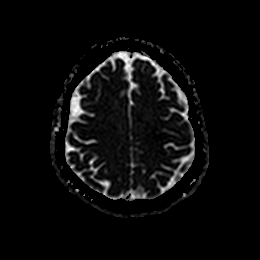
[im 38/48]
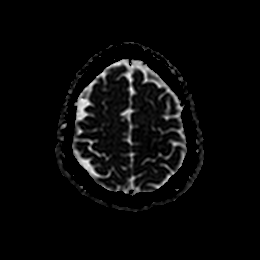
[im 41/48]
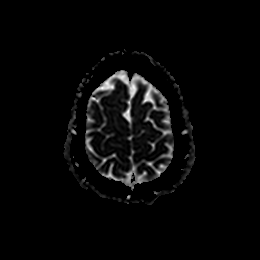
[im 44/48]
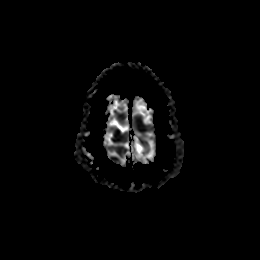
[im 48/48]
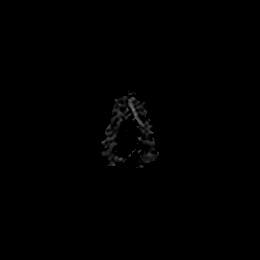

[48 of 48 positions shown; findings below may reference images not displayed]

FINDINGS: Brain: Only diffusion imaging was achieved because of patient
claustrophobia. Sequences normal without evidence of acute small or
large vessel infarction, mass lesion, hemorrhage, hydrocephalus or
extra-axial collection. No sign of advanced atrophy.

Vascular: Flow present in the major vessels at the base of the
brain.

Skull and upper cervical spine: No abnormality seen.

Sinuses/Orbits: Sinuses clear.  Orbits negative.

Other: None
IMPRESSION: Only diffusion imaging is obtained due to patient intolerance. The
diffusion scan is normal. No acute or reversible pathology
identified.

## 2020-05-01 IMAGING — CR DG SHOULDER 2+V*R*
3 series · 3 of 3 positions shown · non-contrast
Comparison: Chest x-ray 6206.

CLINICAL DATA: Fall this morning with right shoulder pain.

EXAM:
RIGHT SHOULDER - 2+ VIEW

[shoulder grashey]
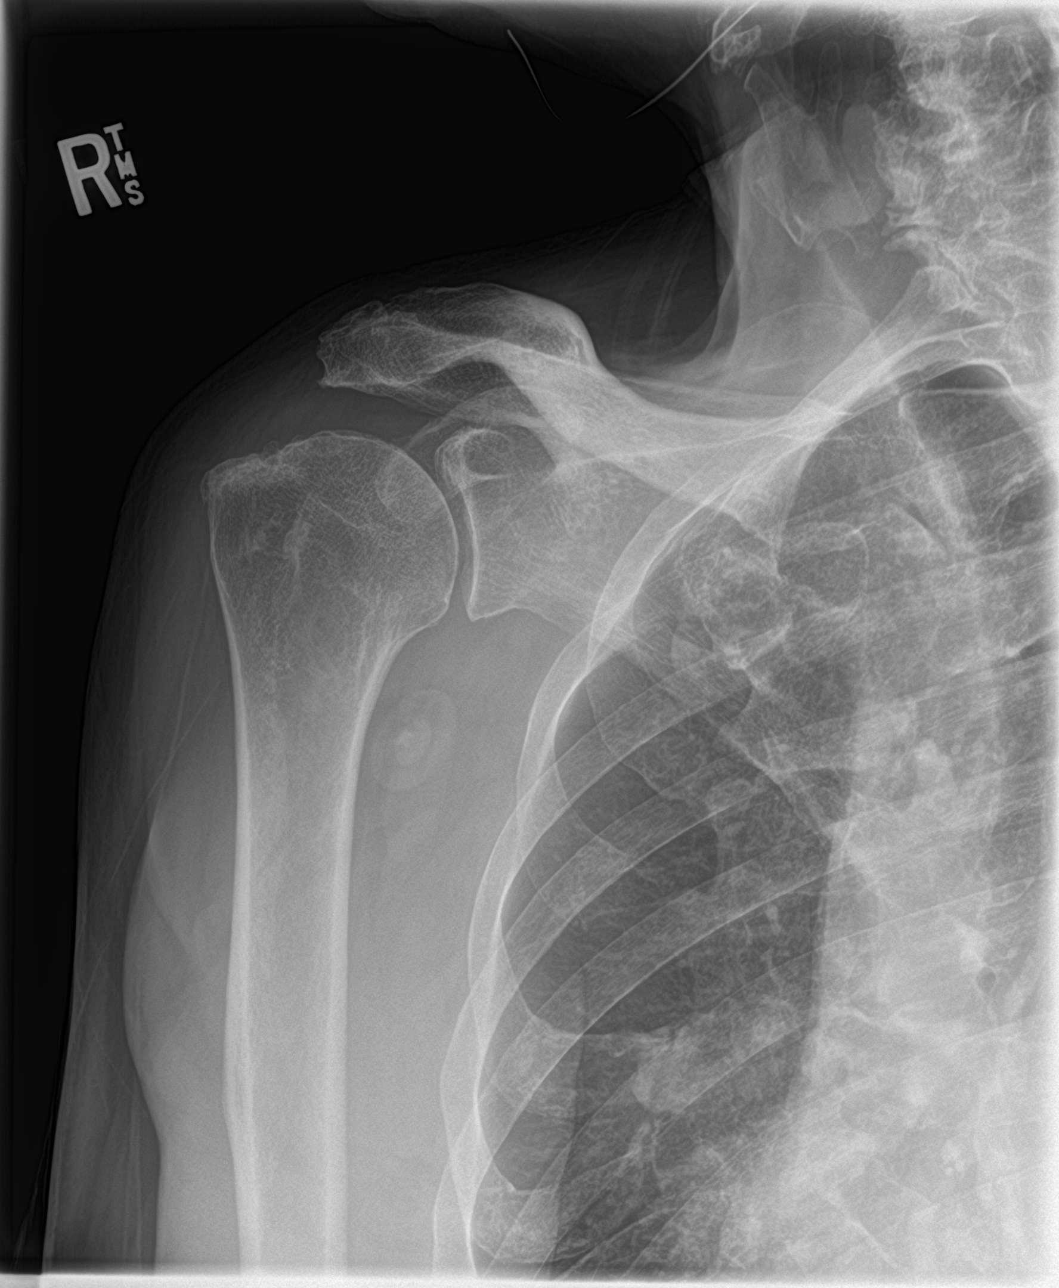

[shoulder y view]
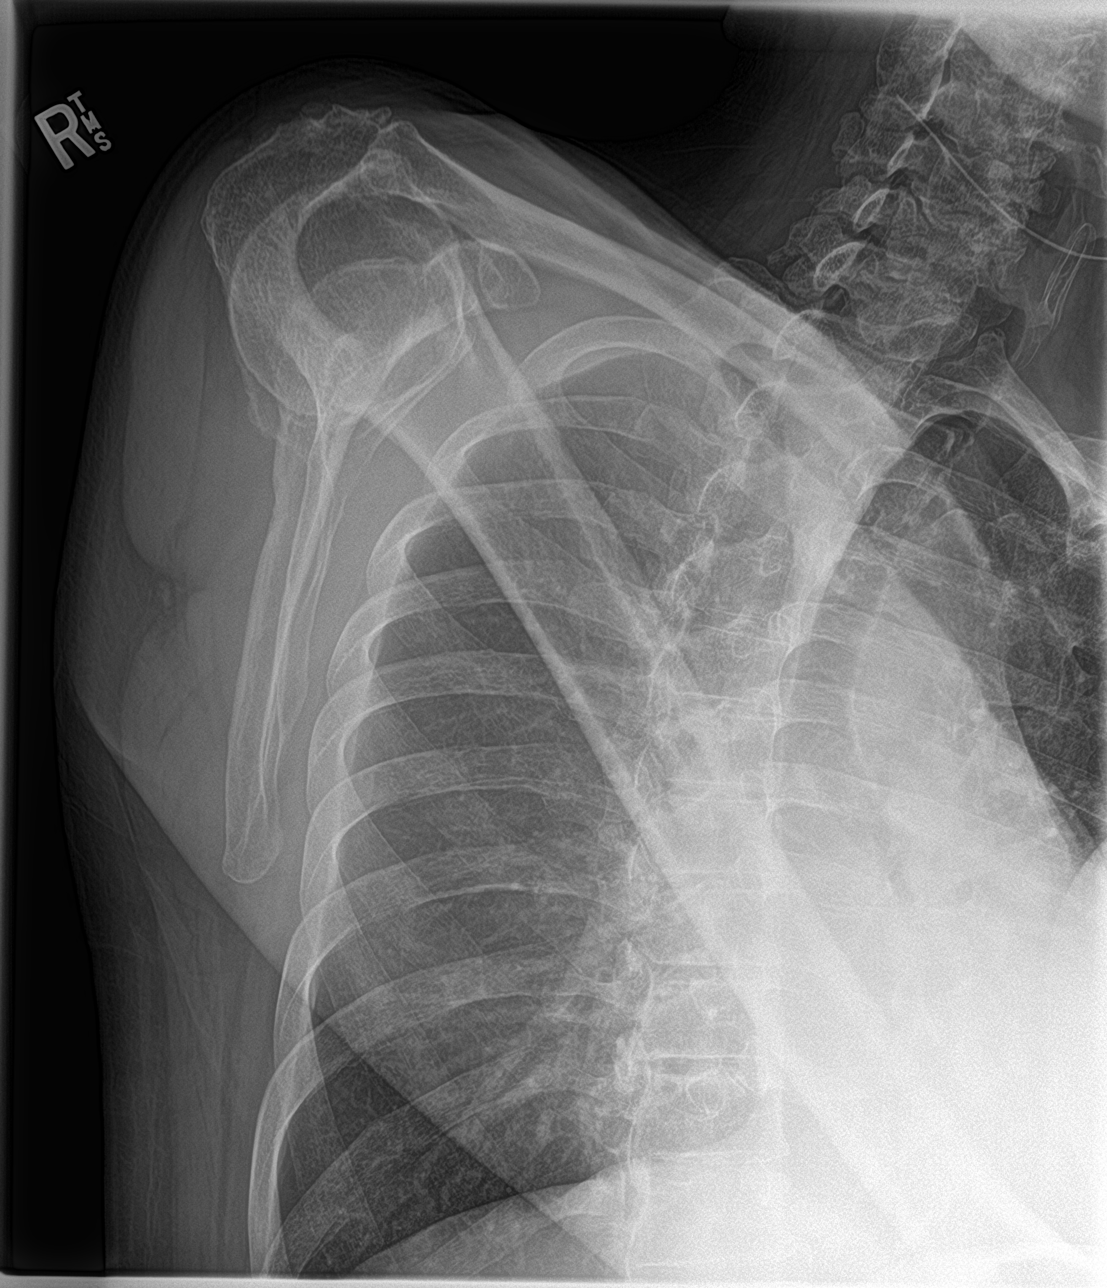

[shoulder axillary]
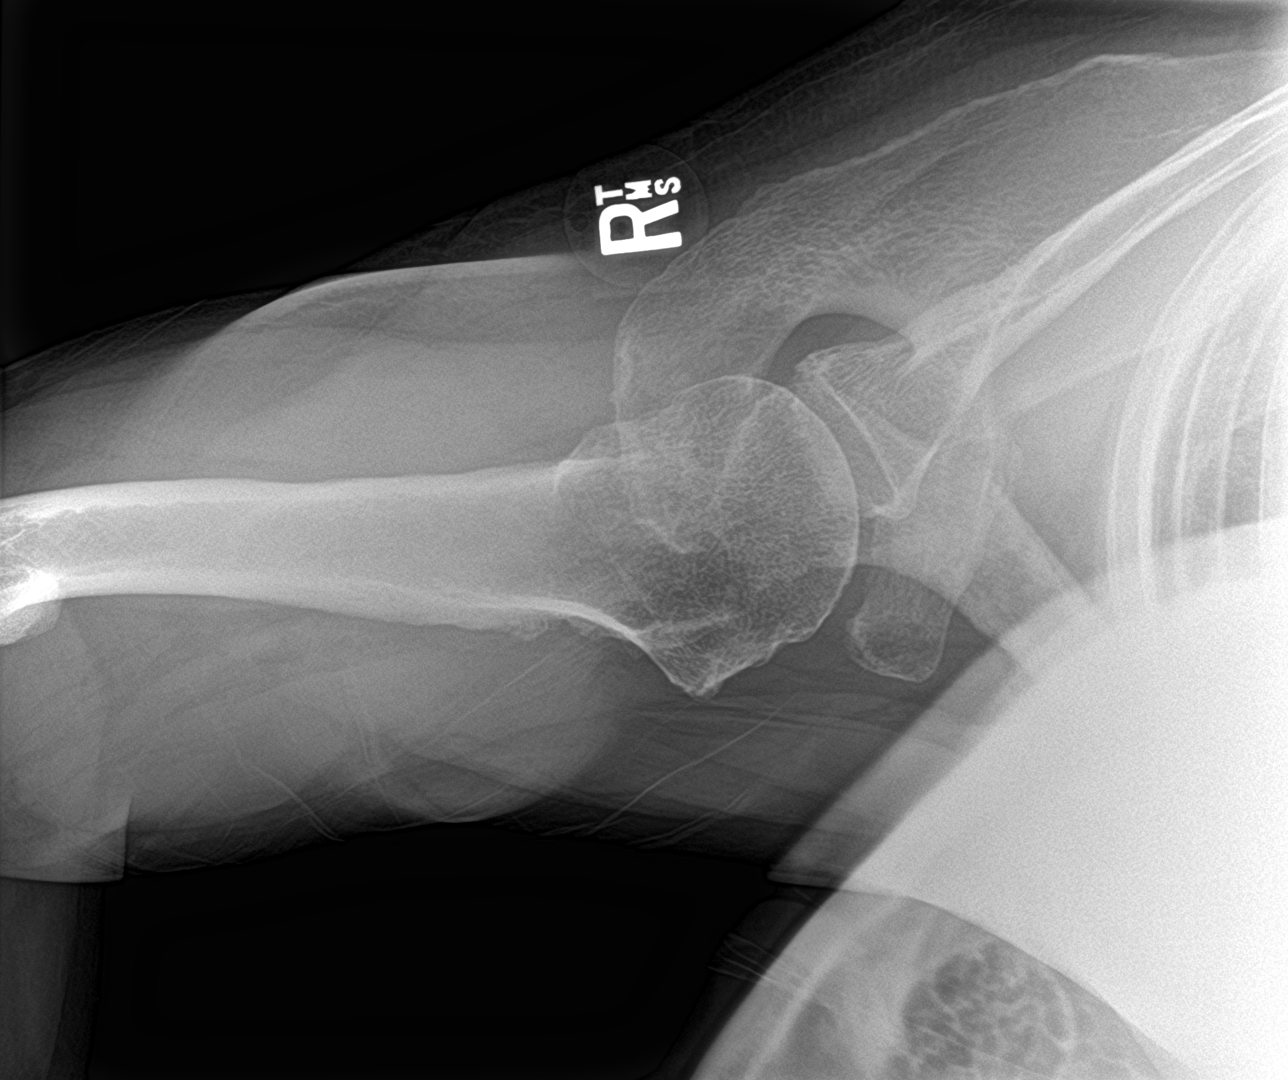

[3 of 3 positions shown; findings below may reference images not displayed]

FINDINGS: Minimal degenerative change of the AC joint and glenohumeral joints.
No evidence of acute fracture or dislocation.
IMPRESSION: No acute findings.

Mild degenerative changes.

## 2020-05-03 IMAGING — DX DG CHEST 1V PORT
1 series · 1 of 1 positions shown · non-contrast
Comparison: 01/12/2015

CLINICAL DATA: Fevers with headaches

EXAM:
PORTABLE CHEST 1 VIEW

[chest ap]
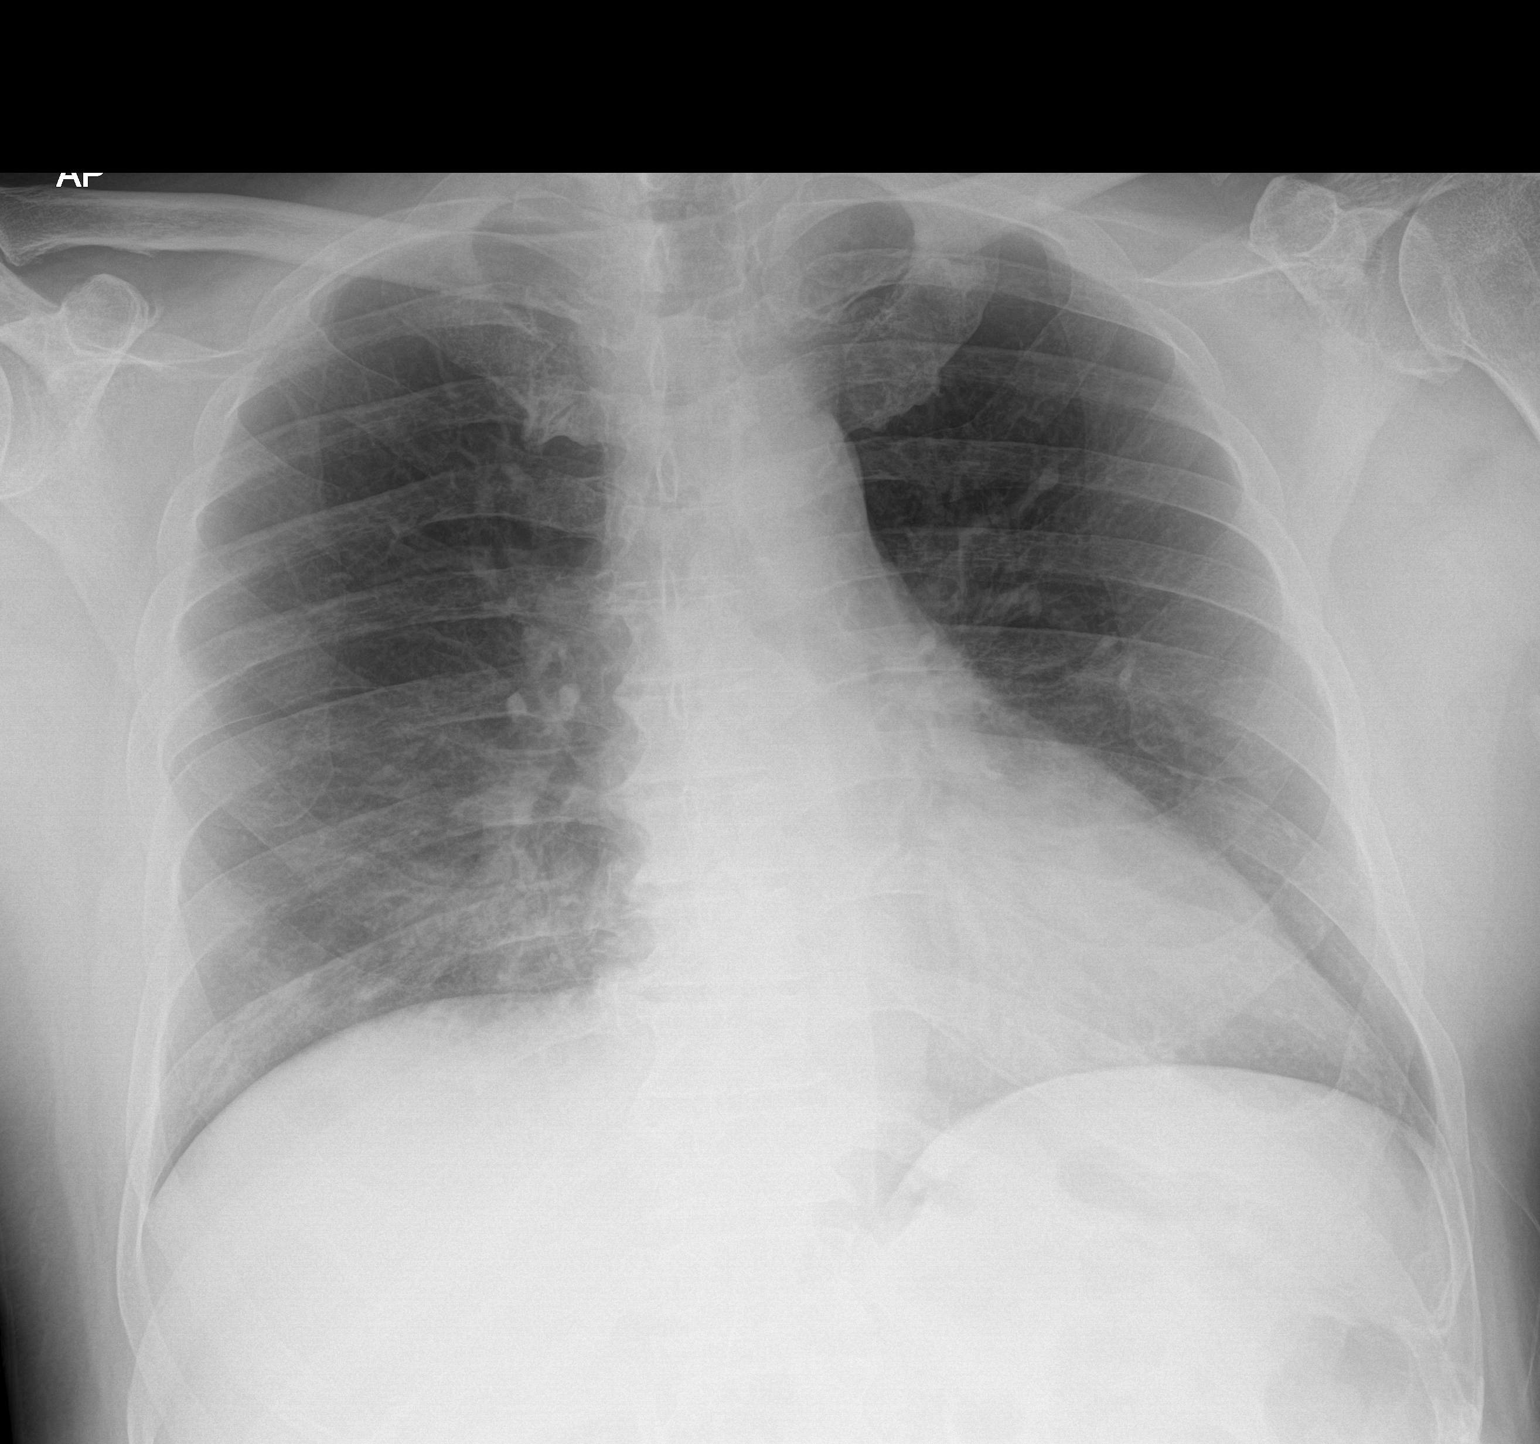

[1 of 1 positions shown; findings below may reference images not displayed]

FINDINGS: Cardiac shadow is stable. The lungs are well aerated bilaterally.
Mild increased density is noted in the right lung base consistent
with early infiltrate. No sizable effusion is noted. Degenerative
changes of the thoracic spine are again seen.
IMPRESSION: Mild early infiltrate in the medial right lung base.

## 2020-05-17 NOTE — Telephone Encounter (Signed)
Error

## 2020-05-28 IMAGING — DX DG CHEST 1V PORT
1 series · 1 of 1 positions shown · non-contrast
Comparison: Chest radiograph dated 06/03/2019

CLINICAL DATA: Cough, chest tightness

EXAM:
PORTABLE CHEST 1 VIEW

[chest ap]
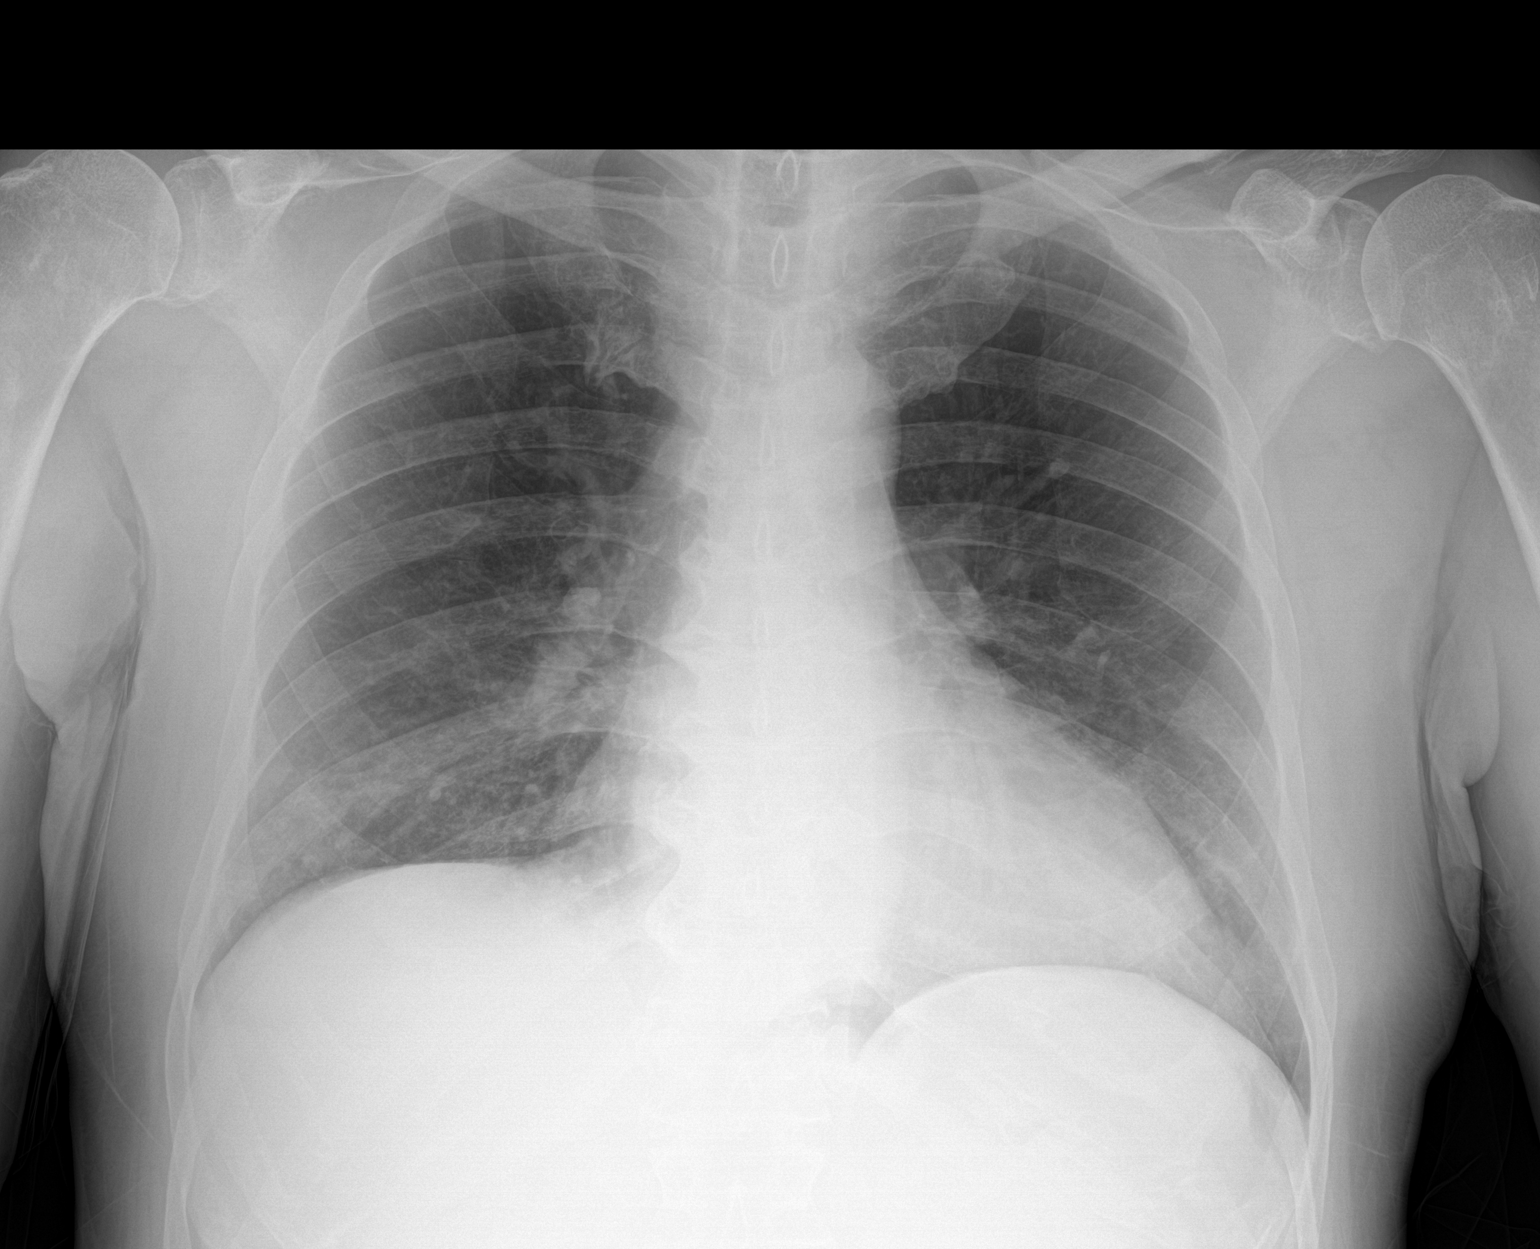

[1 of 1 positions shown; findings below may reference images not displayed]

FINDINGS: The heart size and mediastinal contours are within normal limits.
Mild bibasilar airspace opacities are similar to prior exam. There
is no pleural effusion or pneumothorax. The visualized skeletal
structures are unremarkable.
IMPRESSION: Mild bibasilar airspace opacities, similar to prior exam.

## 2020-06-10 ENCOUNTER — Other Ambulatory Visit: Payer: Self-pay

## 2020-06-10 ENCOUNTER — Encounter: Payer: Self-pay | Admitting: Adult Health

## 2020-06-10 ENCOUNTER — Ambulatory Visit (INDEPENDENT_AMBULATORY_CARE_PROVIDER_SITE_OTHER): Payer: Medicare HMO | Admitting: Adult Health

## 2020-06-10 ENCOUNTER — Other Ambulatory Visit (HOSPITAL_COMMUNITY): Payer: Self-pay | Admitting: Adult Health

## 2020-06-10 ENCOUNTER — Telehealth: Payer: Self-pay | Admitting: Adult Health

## 2020-06-10 DIAGNOSIS — J069 Acute upper respiratory infection, unspecified: Secondary | ICD-10-CM

## 2020-06-10 MED ORDER — ALBUTEROL SULFATE HFA 108 (90 BASE) MCG/ACT IN AERS
2.0000 | INHALATION_SPRAY | Freq: Four times a day (QID) | RESPIRATORY_TRACT | 6 refills | Status: DC | PRN
Start: 2020-06-10 — End: 2020-08-20

## 2020-06-10 MED FILL — ALBUTEROL SULFATE HFA 108 (: 108 (90 BAS | 25 days supply | Qty: 18 | Fill #0

## 2020-06-10 NOTE — Patient Instructions (Addendum)
Advance activity as tolerated.  [facility]  Saline nasal rinses As needed   Mucinex liquid As needed for congestion.  Flu shot as discussed  Albuterol inhaler 1 puff every 6 hrs as needed wheezing/shortness of breath. Follow up with Dr. Wynona Neat in 4-6 months and As needed   Please contact office for sooner follow up if symptoms do not improve or worsen or seek emergency care

## 2020-06-10 NOTE — Assessment & Plan Note (Addendum)
Mild URI , appears to be resolving. May have a component of reactive airways.  Add mucinex and saline As needed   Copy of PFT from [place] requested.   Plan  Patient Instructions  Advance activity as tolerated.  [facility]  Saline nasal rinses As needed   Mucinex liquid As needed for congestion.  Flu shot as discussed  Albuterol inhaler 1 puff every 6 hrs as needed wheezing/shortness of breath. Follow up with Dr. Wynona Neat in 4-6 months and As needed   Please contact office for sooner follow up if symptoms do not improve or worsen or seek emergency care

## 2020-06-10 NOTE — Telephone Encounter (Signed)
Patient Instructions by Julio Sicks, NP at 06/10/2020 12:00 PM Author: Julio Sicks, NP Author Type: Nurse Practitioner Filed: 06/10/2020 12:22 PM  Note Status: Addendum Cosign: Cosign Not Required Encounter Date: 06/10/2020  Editor: Julio Sicks, NP (Nurse Practitioner)      Prior Versions: 1. Parrett, Virgel Bouquet, NP (Nurse Practitioner) at 06/10/2020 12:03 PM - Signed      Advance activity as tolerated.  [facility]  Saline nasal rinses As needed   Mucinex liquid As needed for congestion.  Flu shot as discussed  Albuterol inhaler 1 puff every 6 hrs as needed wheezing/shortness of breath. Follow up with Dr. Wynona Neat in 4-6 months and As needed   Please contact office for sooner follow up if symptoms do not improve or worsen or seek emergency care      Rx for albuterol inhaler has been sent to preferred pharmacy for pt. Attempted to call pt but unable to reach. Left a detailed message on pt's phone letting him know that this had been done. Nothing further needed.

## 2020-06-10 NOTE — Progress Notes (Signed)
@Patient  ID: , male    DOB: Jul 03, 1946, 74 y.o.   MRN: [id number]  Chief Complaint  Patient presents with  . Follow-up    Dyspnea     Referring provider: [id number], MD  HPI: 74 year old male never smoker seen for pulmonary consult October 2020 during hospitalization for COVID-19 complicated by Covid pneumonia.   TEST/EVENTS :  Chest x-ray October 2020 showed bilateral opacities. Chest x-ray January 2021 showed clear lungs Chest x-ray August 2021 showed clear lungs  06/10/2020 Follow up :  Patient was seen for initial pulmonary consult during hospitalization October 2020 for COVID-19 complicated by Covid pneumonia.  He did require remdesivir and steroids. Patient was discharged home and did undergo pulmonary rehab for deconditioning.  Chest x-rays in January and August 2021 showed clearance of pneumonia and clear lungs. Patient says overall breathing has been doing well until couple of weeks ago when he went to [facility] and was out in the cold wind . Feels he has some mild congestion and drainage. No discolored mucus , no fever or loss of taste or smell. No chest pain or calf pain.  He did receive the [facility] Covid vaccine in March 2021 Had PFT at April 2021 early this year, says it was reported as normal.    Allergies  Allergen Reactions  . Penicillins Anaphylaxis    Did it involve swelling of the face/tongue/throat, SOB, or low BP? Yes Did it involve sudden or severe rash/hives, skin peeling, or any reaction on the inside of your mouth or nose? No Did you need to seek medical attention at a hospital or doctor's office? No When did it last happen? If all above answers are "NO", may proceed with cephalosporin use.   . Trazodone And Nefazodone Other (See Comments)    Confusion, agitation, and hyperalert  . Ativan [Lorazepam] Other (See Comments)    agitation   . Decongestant [Pseudoephedrine Hcl Er] Other (See Comments)    Elevated blood  pressure   . Talwin [Pentazocine] Other (See Comments)    Elevated blood pressure.    Immunization History  Administered Date(s) Administered  . Fluad Quad(high Dose 65+) 07/13/2019  . Janssen (J&J) SARS-COV-2 Vaccination 11/01/2019    Past Medical History:  Diagnosis Date  . Bronchitis   . Diabetes mellitus without complication (HCC)   . GERD (gastroesophageal reflux disease)   . History of 2019 novel coronavirus disease (COVID-19) 05/2019  . Hypertension     Tobacco History: Social History   Tobacco Use  Smoking Status Never Smoker  Smokeless Tobacco Never Used   Counseling given: Not Answered   Outpatient Medications Prior to Visit  Medication Sig Dispense Refill  . acetaminophen (TYLENOL) 160 MG/5ML elixir Take 15.6 mLs (500 mg total) by mouth every 4 (four) hours as needed for fever. 473 mL 2  . bimatoprost (LUMIGAN) 0.03 % ophthalmic solution Place 1 drop into both eyes at bedtime.    . fluticasone (VERAMYST) 27.5 MCG/SPRAY nasal spray Place 2 sprays into the nose daily.    06/2019 guaiFENesin (ROBITUSSIN) 100 MG/5ML SOLN Take 10 mLs (200 mg total) by mouth every 4 (four) hours as needed for cough or to loosen phlegm. 300 mL 0  . lisinopril-hydrochlorothiazide (ZESTORETIC) 20-25 MG tablet Take 1 tablet by mouth daily.    . Melatonin 3 MG TABS Take 3 mg by mouth at bedtime.    . metoprolol tartrate (LOPRESSOR) 25 MG tablet Take 1 tablet (25 mg total) by mouth 2 (two)  times daily. 60 tablet 3  . vitamin C (ASCORBIC ACID) 500 MG tablet Take 500 mg by mouth daily.    . Zinc Citrate-Phytase 25-500 MG CAPS Take by mouth.    . hydrochlorothiazide (HYDRODIURIL) 25 MG tablet Take 1 tablet (25 mg total) by mouth every morning. 90 tablet 0   No facility-administered medications prior to visit.     Review of Systems:   Constitutional:   No  weight loss, night sweats,  Fevers, chills, + fatigue, or  lassitude.  HEENT:   No headaches,  Difficulty swallowing,  Tooth/dental  problems, or  Sore throat,                No sneezing, itching, ear ache, +nasal congestion, post nasal drip,   CV:  No chest pain,  Orthopnea, PND, swelling in lower extremities, anasarca, dizziness, palpitations, syncope.   GI  No heartburn, indigestion, abdominal pain, nausea, vomiting, diarrhea, change in bowel habits, loss of appetite, bloody stools.   Resp: .  No chest wall deformity  Skin: no rash or lesions.  GU: no dysuria, change in color of urine, no urgency or frequency.  No flank pain, no hematuria   MS:  No joint pain or swelling.  No decreased range of motion.  No back pain.    Physical Exam  BP (!) 152/62 (BP Location: Left Arm, Cuff Size: Normal)   Pulse 83   Temp 98.8 F (37.1 C) (Temporal)   Ht 5\' 8"  (1.727 m)   Wt 166 lb 9.6 oz (75.6 kg)   SpO2 99%   BMI 25.33 kg/m   GEN: A/Ox3; pleasant , NAD, well nourished    HEENT:  [place]/AT,  EACs-clear, TMs-wnl, NOSE-clear, THROAT-clear, no lesions, no postnasal drip or exudate noted.   NECK:  Supple w/ fair ROM; no JVD; normal carotid impulses w/o bruits; no thyromegaly or nodules palpated; no lymphadenopathy.    RESP  Clear  P & A; w/o, wheezes/ rales/ or rhonchi. no accessory muscle use, no dullness to percussion  CARD:  RRR, no m/r/g, no peripheral edema, pulses intact, no cyanosis or clubbing.  GI:   Soft & nt; nml bowel sounds; no organomegaly or masses detected.   Musco: Warm bil, no deformities or joint swelling noted.   Neuro: alert, no focal deficits noted.    Skin: Warm, no lesions or rashes    Lab Results:  CBC  BMET  ProBNP   Imaging: No results found.    No flowsheet data found.  No results found for: NITRICOXIDE      Assessment & Plan:   URI (upper respiratory infection) Mild URI , appears to be resolving. May have a component of reactive airways.  Add mucinex and saline As needed   Copy of PFT from requested.   Plan  Patient Instructions  Advance activity as  tolerated.  [facility]  Saline nasal rinses As needed   Mucinex liquid As needed for congestion.  Flu shot as discussed  Albuterol inhaler 1 puff every 6 hrs as needed wheezing/shortness of breath. Follow up with Dr. [place] in 4-6 months and As needed   Please contact office for sooner follow up if symptoms do not improve or worsen or seek emergency care          Wynona Neat, NP 06/10/2020

## 2020-08-06 ENCOUNTER — Ambulatory Visit: Payer: Medicare HMO | Admitting: Pulmonary Disease

## 2020-08-20 ENCOUNTER — Other Ambulatory Visit: Payer: Self-pay

## 2020-08-20 ENCOUNTER — Encounter: Payer: Self-pay | Admitting: Pulmonary Disease

## 2020-08-20 ENCOUNTER — Other Ambulatory Visit (HOSPITAL_COMMUNITY): Payer: Self-pay | Admitting: Pulmonary Disease

## 2020-08-20 ENCOUNTER — Ambulatory Visit (INDEPENDENT_AMBULATORY_CARE_PROVIDER_SITE_OTHER): Payer: Medicare HMO | Admitting: Pulmonary Disease

## 2020-08-20 VITALS — BP 138/78 | HR 98 | Ht 68.0 in | Wt 165.4 lb

## 2020-08-20 DIAGNOSIS — J310 Chronic rhinitis: Secondary | ICD-10-CM | POA: Diagnosis not present

## 2020-08-20 DIAGNOSIS — Z8616 Personal history of COVID-19: Secondary | ICD-10-CM | POA: Diagnosis not present

## 2020-08-20 MED ORDER — IPRATROPIUM BROMIDE 0.03 % NA SOLN: 2.0000 | Freq: Two times a day (BID) | NASAL | 12 refills | Status: DC

## 2020-08-20 MED ORDER — ALBUTEROL SULFATE HFA 108 (90 BASE) MCG/ACT IN AERS: 2.0000 | INHALATION_SPRAY | Freq: Four times a day (QID) | RESPIRATORY_TRACT | 6 refills | Status: DC | PRN

## 2020-08-20 MED FILL — ALBUTEROL SULFATE HFA 108 (: 108 (90 BAS | 20 days supply | Qty: 9 | Fill #0

## 2020-08-20 MED FILL — IPRATROPIUM 0.03% SPRAY: 0.03 | 43 days supply | Qty: 30 | Fill #0

## 2020-08-20 NOTE — Progress Notes (Signed)
Synopsis: Referred in December 2020 for post-COVID by Ethan Pierce, James, MD.   Subjective:   PATIENT ID: Ethan Pierce GENDER: male DOB: [birth year], MRN: [id number]   HPI  Chief Complaint  Patient presents with  . Follow-up    Pt is a former pt of Dr. Chestine Sporelark here to establish with Dr. Francine Gravenewald. Pt states he has been doing okay since last visit. States his cough is better and denies any complaints. Pt was prescribed albuterol at last visit which he states works well.    Ethan Pierce is a 75 year old male, never smoker who has been followed in pulmonary clinic after covid 19 infection in 07/2019. He returns today for follow up after treatment for an upper respiratory viral infection.   He was last seen 06/10/20 where he was treated for an URI as he was experiencing cough, sinus congestion/drainage and shortness of breath. He was given an albuterol inhaler, started on saline nasal rinses, and mucinex for congestion.   He reports he has been feeling better with albuterol inhaler. He uses the inhaler 1-2 times per week when he wakes up with chest congestion and he notes significant improvement after each use. He continues to have runny nose and denies overt post-nasal drainage. He is using fluticasone nasal spray daily. He has also started using a humidifier in his bedroom at night when he sleeps which has helped.   Past Medical History:  Diagnosis Date  . Bronchitis   . Diabetes mellitus without complication (HCC)   . GERD (gastroesophageal reflux disease)   . History of 2019 novel coronavirus disease (COVID-19) 05/2019  . Hypertension      Family History  Problem Relation Age of Onset  . Diabetes Mother   . GER disease Mother   . Cancer Father        pancreas  . Diabetes Sister   . Diabetes Brother      Social History   Socioeconomic History  . Marital status: Married    Spouse name: Not on file  . Number of children: 2  . Years of education: Not on file  . Highest education  level: Not on file  Occupational History  . Not on file  Tobacco Use  . Smoking status: Never Smoker  . Smokeless tobacco: Never Used  Vaping Use  . Vaping Use: Never used  Substance and Sexual Activity  . Alcohol use: No  . Drug use: No  . Sexual activity: Yes  Other Topics Concern  . Not on file  Social History Narrative  . Not on file   Social Determinants of Health   Financial Resource Strain: Not on file  Food Insecurity: Not on file  Transportation Needs: Not on file  Physical Activity: Not on file  Stress: Not on file  Social Connections: Not on file  Intimate Partner Violence: Not on file     Allergies  Allergen Reactions  . Penicillins Anaphylaxis    Did it involve swelling of the face/tongue/throat, SOB, or low BP? Yes Did it involve sudden or severe rash/hives, skin peeling, or any reaction on the inside of your mouth or nose? No Did you need to seek medical attention at a hospital or doctor's office? No When did it last happen? If all above answers are "NO", may proceed with cephalosporin use.   . Trazodone And Nefazodone Other (See Comments)    Confusion, agitation, and hyperalert  . Ativan [Lorazepam] Other (See Comments)    agitation   .  Decongestant [Pseudoephedrine Hcl Er] Other (See Comments)    Elevated blood pressure   . Talwin [Pentazocine] Other (See Comments)    Elevated blood pressure.     Outpatient Medications Prior to Visit  Medication Sig Dispense Refill  . acetaminophen (TYLENOL) 160 MG/5ML elixir Take 15.6 mLs (500 mg total) by mouth every 4 (four) hours as needed for fever. 473 mL 2  . bimatoprost (LUMIGAN) 0.03 % ophthalmic solution Place 1 drop into both eyes at bedtime.    . diphenhydrAMINE-Acetaminophen (TYLENOL COLD RELIEF PO) Take by mouth as needed.    . fluticasone (VERAMYST) 27.5 MCG/SPRAY nasal spray Place 2 sprays into the nose daily.    Marland Kitchen lisinopril-hydrochlorothiazide (ZESTORETIC) 20-25 MG tablet Take 1 tablet by  mouth daily.    . metoprolol tartrate (LOPRESSOR) 25 MG tablet Take 1 tablet (25 mg total) by mouth 2 (two) times daily. 60 tablet 3  . Multiple Vitamins-Minerals (CENTRUM SILVER 50+MEN) TABS Take 2 tablets by mouth daily.    Marland Kitchen albuterol (VENTOLIN HFA) 108 (90 Base) MCG/ACT inhaler Inhale 2 puffs into the lungs every 6 (six) hours as needed for wheezing or shortness of breath. 8 g 6  . hydrochlorothiazide (HYDRODIURIL) 25 MG tablet Take 1 tablet (25 mg total) by mouth every morning. 90 tablet 0  . Melatonin 3 MG TABS Take 3 mg by mouth at bedtime. (Patient not taking: Reported on 08/20/2020)    . guaiFENesin (ROBITUSSIN) 100 MG/5ML SOLN Take 10 mLs (200 mg total) by mouth every 4 (four) hours as needed for cough or to loosen phlegm. 300 mL 0  . vitamin C (ASCORBIC ACID) 500 MG tablet Take 500 mg by mouth daily.    . Zinc Citrate-Phytase 25-500 MG CAPS Take by mouth.     No facility-administered medications prior to visit.    Review of Systems  Constitutional: Negative for chills, fever, malaise/fatigue and weight loss.  HENT: Positive for congestion. Negative for nosebleeds and sore throat.        Runny nose  Eyes: Negative.   Respiratory: Negative for cough, hemoptysis, sputum production, shortness of breath and wheezing.   Cardiovascular: Negative for chest pain, palpitations, orthopnea, claudication, leg swelling and PND.  Gastrointestinal: Negative for abdominal pain, heartburn, nausea and vomiting.  Genitourinary: Negative.   Musculoskeletal: Negative.   Skin: Negative for rash.  Neurological: Negative.   Endo/Heme/Allergies: Negative.   Psychiatric/Behavioral: Negative.    Objective:   Vitals:   08/20/20 1122  BP: 138/78  Pulse: 98  SpO2: 99%  Weight: 165 lb 6.4 oz (75 kg)  Height: 5\' 8"  (1.727 m)     Physical Exam HENT:     Head: Normocephalic and atraumatic.     Nose:     Comments: Deferred due to mask use for covid 19    Mouth/Throat:     Comments: Deferred due  to mask use for covid 19 Eyes:     General: No scleral icterus.    Conjunctiva/sclera: Conjunctivae normal.  Cardiovascular:     Rate and Rhythm: Normal rate and regular rhythm.     Pulses: Normal pulses.     Heart sounds: Normal heart sounds. No murmur heard.   Pulmonary:     Effort: Pulmonary effort is normal.     Breath sounds: Normal breath sounds. No wheezing, rhonchi or rales.  Musculoskeletal:     Right lower leg: No edema.     Left lower leg: No edema.  Lymphadenopathy:     Cervical: No cervical adenopathy.  Skin:    General: Skin is warm and dry.  Neurological:     General: No focal deficit present.  Psychiatric:        Mood and Affect: Mood normal.        Behavior: Behavior normal.        Thought Content: Thought content normal.        Judgment: Judgment normal.     CBC    Component Value Date/Time   WBC 8.0 06/25/2019 2024   RBC 3.60 (L) 06/25/2019 2024   HGB 11.1 (L) 06/25/2019 2024   HCT 34.1 (L) 06/25/2019 2024   PLT 264 06/25/2019 2024   MCV 94.7 06/25/2019 2024   MCH 30.8 06/25/2019 2024   MCHC 32.6 06/25/2019 2024   RDW 13.6 06/25/2019 2024   LYMPHSABS 1.5 06/08/2019 0435   MONOABS 1.1 (H) 06/08/2019 0435   EOSABS 0.0 06/08/2019 0435   BASOSABS 0.0 06/08/2019 0435   BMP Latest Ref Rng & Units 06/25/2019 06/08/2019 06/07/2019  Glucose 70 - 99 mg/dL 845(X) 646(O) 90  BUN 8 - 23 mg/dL 16 03(O) 12(Y)  Creatinine 0.61 - 1.24 mg/dL 4.82 5.00 3.70  Sodium 135 - 145 mmol/L 138 140 143  Potassium 3.5 - 5.1 mmol/L 3.7 3.1(L) 3.1(L)  Chloride 98 - 111 mmol/L 105 102 104  CO2 22 - 32 mmol/L 25 29 27   Calcium 8.9 - 10.3 mg/dL ) 7.9(L) 8.2(L)   Chest imaging: CXR 03/08/20 The heart size and mediastinal contours are within normal limits. Both lungs are clear. The visualized skeletal structures are Unremarkable.  CXR 08/18/19 Heart is borderline in size. Lungs clear. No effusions. No acute bony abnormality.  CXR 06/25/2019 The heart size and  mediastinal contours are within normal limits. Mild bibasilar airspace opacities are similar to prior exam. There is no pleural effusion or pneumothorax. The visualized skeletal structures are unremarkable.  PFT: No flowsheet data found.  Assessment & Plan:   Chronic rhinitis  History of 2019 novel coronavirus disease (COVID-19)  Discussion: Markice Torbert is a 75 year old male, never smoker who has been followed in pulmonary clinic after covid 19 infection in 07/2019. He returns today for follow up after treatment for an upper respiratory viral infection.   He continues to have rhinitis and sinus congestion with possible nocturnal post-nasal drainage. He is to continue fluticasone nasal spray daily and add ipratropium nasal spray twice daily. He can use saline nasal spray or Ayr saline gel to keep his nasal passages moisturized with the nasal/sinus medications. He can continue as needed use for albuterol for chest congestion, cough, wheezing or chest tightness.   Follow up in 6 months  08/2019, MD [facility] Office: [phone]   See Amion for Pager Details      Current Outpatient Medications:  .  acetaminophen (TYLENOL) 160 MG/5ML elixir, Take 15.6 mLs (500 mg total) by mouth every 4 (four) hours as needed for fever., Disp: 473 mL, Rfl: 2 .  bimatoprost (LUMIGAN) 0.03 % ophthalmic solution, Place 1 drop into both eyes at bedtime., Disp: , Rfl:  .  diphenhydrAMINE-Acetaminophen (TYLENOL COLD RELIEF PO), Take by mouth as needed., Disp: , Rfl:  .  fluticasone (VERAMYST) 27.5 MCG/SPRAY nasal spray, Place 2 sprays into the nose daily., Disp: , Rfl:  .  ipratropium (ATROVENT) 0.03 % nasal spray, Place 2 sprays into both nostrils every 12 (twelve) hours., Disp: 30 mL, Rfl: 12 .  lisinopril-hydrochlorothiazide (ZESTORETIC) 20-25 MG tablet, Take 1 tablet by mouth  daily., Disp: , Rfl:  .  metoprolol tartrate (LOPRESSOR) 25 MG tablet, Take 1 tablet (25 mg  total) by mouth 2 (two) times daily., Disp: 60 tablet, Rfl: 3 .  Multiple Vitamins-Minerals (CENTRUM SILVER 50+MEN) TABS, Take 2 tablets by mouth daily., Disp: , Rfl:  .  albuterol (VENTOLIN HFA) 108 (90 Base) MCG/ACT inhaler, Inhale 2 puffs into the lungs every 6 (six) hours as needed for wheezing or shortness of breath., Disp: 8 g, Rfl: 6 .  hydrochlorothiazide (HYDRODIURIL) 25 MG tablet, Take 1 tablet (25 mg total) by mouth every morning., Disp: 90 tablet, Rfl: 0 .  Melatonin 3 MG TABS, Take 3 mg by mouth at bedtime. (Patient not taking: Reported on 08/20/2020), Disp: , Rfl:

## 2020-08-20 NOTE — Patient Instructions (Addendum)
Start ipratropium nasal spray twice daily  Continue fluticasone nasal spray  Use saline nasal spray or Ayr Saline gel as needed for nasal dryness  Continue albuterol inhaler as needed 1-2 sprays for cough, chest tightness, chest congestion or wheezing

## 2020-10-01 ENCOUNTER — Encounter: Payer: Self-pay | Admitting: Pulmonary Disease

## 2020-10-01 ENCOUNTER — Ambulatory Visit (INDEPENDENT_AMBULATORY_CARE_PROVIDER_SITE_OTHER): Payer: Medicare HMO | Admitting: Pulmonary Disease

## 2020-10-01 ENCOUNTER — Other Ambulatory Visit: Payer: Self-pay

## 2020-10-01 VITALS — BP 130/70 | HR 77 | Ht 68.0 in | Wt 165.2 lb

## 2020-10-01 DIAGNOSIS — R0982 Postnasal drip: Secondary | ICD-10-CM

## 2020-10-01 DIAGNOSIS — R059 Cough, unspecified: Secondary | ICD-10-CM

## 2020-10-01 NOTE — Progress Notes (Signed)
Synopsis: Referred in December 2020 for post-COVID by Pearson GrippeKim, James, MD.   Subjective:   PATIENT ID: Ethan Pierce GENDER: male DOB: Jan 09, 1946, MRN: [id number]  Phlegm in his throat Sinus congestion is better. Some drainage.  [facility] cough [facility]   HPI  Chief Complaint  Patient presents with  . Follow-up    Pt states he has been having problems with congestion in both head and chest. Pt does have a mild cough. States he has been blowing his nose getting out clear postnasal drainage. Denies any complaints of wheezing or SOB.    Ethan Pierce is a 75 year old male, never smoker who has been followed in pulmonary clinic after covid 19 infection in 07/2019. He returns today for acute visit for cough and congestion.   He reports increase nasal congestion, cough and phlegm in his throat with the recent weather changes. [facility] but is unable to recall which medicine.  He denies fevers, chill or sweats. He does have some post-nasal drainage. He has not started the ipratropium nasal spray that was prescribed at the last visit. He continues to use flonase daily.   Past Medical History:  Diagnosis Date  . Bronchitis   . Diabetes mellitus without complication (HCC)   . GERD (gastroesophageal reflux disease)   . History of 2019 novel coronavirus disease (COVID-19) 05/2019  . Hypertension      Family History  Problem Relation Age of Onset  . Diabetes Mother   . GER disease Mother   . Cancer Father        pancreas  . Diabetes Sister   . Diabetes Brother      Social History   Socioeconomic History  . Marital status: Married    Spouse name: Not on file  . Number of children: 2  . Years of education: Not on file  . Highest education level: Not on file  Occupational History  . Not on file  Tobacco Use  . Smoking status: Never Smoker  . Smokeless tobacco: Never Used  Vaping Use  . Vaping Use: Never used   Substance and Sexual Activity  . Alcohol use: No  . Drug use: No  . Sexual activity: Yes  Other Topics Concern  . Not on file  Social History Narrative  . Not on file   Social Determinants of Health   Financial Resource Strain: Not on file  Food Insecurity: Not on file  Transportation Needs: Not on file  Physical Activity: Not on file  Stress: Not on file  Social Connections: Not on file  Intimate Partner Violence: Not on file     Allergies  Allergen Reactions  . Penicillins Anaphylaxis    Did it involve swelling of the face/tongue/throat, SOB, or low BP? Yes Did it involve sudden or severe rash/hives, skin peeling, or any reaction on the inside of your mouth or nose? No Did you need to seek medical attention at a hospital or doctor's office? No When did it last happen? If all above answers are "NO", may proceed with cephalosporin use.   . Trazodone And Nefazodone Other (See Comments)    Confusion, agitation, and hyperalert  . Ativan [Lorazepam] Other (See Comments)    agitation   . Decongestant [Pseudoephedrine Hcl Er] Other (See Comments)    Elevated blood pressure   . Talwin [Pentazocine] Other (See Comments)    Elevated blood pressure.     Outpatient  Medications Prior to Visit  Medication Sig Dispense Refill  . acetaminophen (TYLENOL) 160 MG/5ML elixir Take 15.6 mLs (500 mg total) by mouth every 4 (four) hours as needed for fever. 473 mL 2  . albuterol (VENTOLIN HFA) 108 (90 Base) MCG/ACT inhaler Inhale 2 puffs into the lungs every 6 (six) hours as needed for wheezing or shortness of breath. 8 g 6  . bimatoprost (LUMIGAN) 0.03 % ophthalmic solution Place 1 drop into both eyes at bedtime.    . diphenhydrAMINE-Acetaminophen (TYLENOL COLD RELIEF PO) Take by mouth as needed.    . fluticasone (VERAMYST) 27.5 MCG/SPRAY nasal spray Place 2 sprays into the nose daily.    Marland Kitchen ipratropium (ATROVENT) 0.03 % nasal spray Place 2 sprays into both nostrils every 12 (twelve)  hours. 30 mL 12  . lisinopril-hydrochlorothiazide (ZESTORETIC) 20-25 MG tablet Take 1 tablet by mouth daily.    . Melatonin 3 MG TABS Take 3 mg by mouth at bedtime.    . metoprolol tartrate (LOPRESSOR) 25 MG tablet Take 1 tablet (25 mg total) by mouth 2 (two) times daily. 60 tablet 3  . Multiple Vitamins-Minerals (CENTRUM SILVER 50+MEN) TABS Take 2 tablets by mouth daily.    . hydrochlorothiazide (HYDRODIURIL) 25 MG tablet Take 25 mg by mouth daily.    . hydrochlorothiazide (HYDRODIURIL) 25 MG tablet Take 1 tablet (25 mg total) by mouth every morning. 90 tablet 0   No facility-administered medications prior to visit.    Review of Systems  Constitutional: Negative for chills, fever, malaise/fatigue and weight loss.  HENT: Positive for congestion. Negative for nosebleeds and sore throat.        Runny nose  Eyes: Negative.   Respiratory: Positive for cough and sputum production. Negative for hemoptysis, shortness of breath and wheezing.   Cardiovascular: Negative for chest pain, palpitations, orthopnea, claudication, leg swelling and PND.  Gastrointestinal: Negative for abdominal pain, heartburn, nausea and vomiting.  Genitourinary: Negative.   Musculoskeletal: Negative.   Skin: Negative for rash.  Neurological: Negative.   Endo/Heme/Allergies: Negative.   Psychiatric/Behavioral: Negative.    Objective:   Vitals:   10/01/20 0935  BP: 130/70  Pulse: 77  SpO2: 99%  Weight: 165 lb 3.2 oz (74.9 kg)  Height: 5\' 8"  (1.727 m)     Physical Exam Constitutional:      General: He is not in acute distress.    Appearance: Normal appearance. He is normal weight. He is not ill-appearing.  HENT:     Head: Normocephalic and atraumatic.     Nose: Nose normal.     Mouth/Throat:     Mouth: Mucous membranes are moist.     Pharynx: Oropharynx is clear. No oropharyngeal exudate or posterior oropharyngeal erythema.  Eyes:     General: No scleral icterus.    Conjunctiva/sclera: Conjunctivae  normal.  Cardiovascular:     Rate and Rhythm: Normal rate and regular rhythm.     Pulses: Normal pulses.     Heart sounds: Normal heart sounds. No murmur heard.   Pulmonary:     Effort: Pulmonary effort is normal.     Breath sounds: Normal breath sounds. No wheezing, rhonchi or rales.  Musculoskeletal:     Right lower leg: No edema.     Left lower leg: No edema.  Lymphadenopathy:     Cervical: No cervical adenopathy.  Skin:    General: Skin is warm and dry.  Neurological:     General: No focal deficit present.     Mental Status:  He is alert.  Psychiatric:        Mood and Affect: Mood normal.        Behavior: Behavior normal.        Thought Content: Thought content normal.        Judgment: Judgment normal.     CBC    Component Value Date/Time   WBC 8.0 06/25/2019 2024   RBC 3.60 (L) 06/25/2019 2024   HGB 11.1 (L) 06/25/2019 2024   HCT 34.1 (L) 06/25/2019 2024   PLT 264 06/25/2019 2024   MCV 94.7 06/25/2019 2024   MCH 30.8 06/25/2019 2024   MCHC 32.6 06/25/2019 2024   RDW 13.6 06/25/2019 2024   LYMPHSABS 1.5 06/08/2019 0435   MONOABS 1.1 (H) 06/08/2019 0435   EOSABS 0.0 06/08/2019 0435   BASOSABS 0.0 06/08/2019 0435   BMP Latest Ref Rng & Units 06/25/2019 06/08/2019 06/07/2019  Glucose 70 - 99 mg/dL 841(L) 244(W) 90  BUN 8 - 23 mg/dL 16 10(U) 72(Z)  Creatinine 0.61 - 1.24 mg/dL 3.66 4.40 3.47  Sodium 135 - 145 mmol/L 138 140 143  Potassium 3.5 - 5.1 mmol/L 3.7 3.1(L) 3.1(L)  Chloride 98 - 111 mmol/L 105 102 104  CO2 22 - 32 mmol/L 25 29 27   Calcium 8.9 - 10.3 mg/dL ) 7.9(L) 8.2(L)   Chest imaging: CXR 03/08/20 The heart size and mediastinal contours are within normal limits. Both lungs are clear. The visualized skeletal structures are Unremarkable.  CXR 08/18/19 Heart is borderline in size. Lungs clear. No effusions. No acute bony abnormality.  CXR 06/25/2019 The heart size and mediastinal contours are within normal limits. Mild bibasilar airspace  opacities are similar to prior exam. There is no pleural effusion or pneumothorax. The visualized skeletal structures are unremarkable.  PFT: No flowsheet data found.  Assessment & Plan:   Cough  Post-nasal drip  Discussion: Ethan Pierce is a 75 year old male, never smoker who has been followed in pulmonary clinic after covid 19 infection in 07/2019. He returns today for acute visit for cough and congestion.   He continues to have rhinitis and sinus congestion with possible nocturnal post-nasal drainage. He is to continue fluticasone nasal spray daily and add ipratropium nasal spray twice daily. He can use saline nasal spray or Ayr saline gel to keep his nasal passages moisturized with the nasal/sinus medications. He can continue as needed use for albuterol for chest congestion, cough, wheezing or chest tightness.   He can add mucinex twice daily as needed for the phlegm if it is difficult to clear.  If he continues to have issues in 7-10 days then we will consider antibiotic therapy. He may also require a daily allergy medicine such as allegra or zyrtec.     9-10, MD [facility] Office: [phone]   See Amion for Pager Details      Current Outpatient Medications:  .  acetaminophen (TYLENOL) 160 MG/5ML elixir, Take 15.6 mLs (500 mg total) by mouth every 4 (four) hours as needed for fever., Disp: 473 mL, Rfl: 2 .  albuterol (VENTOLIN HFA) 108 (90 Base) MCG/ACT inhaler, Inhale 2 puffs into the lungs every 6 (six) hours as needed for wheezing or shortness of breath., Disp: 8 g, Rfl: 6 .  bimatoprost (LUMIGAN) 0.03 % ophthalmic solution, Place 1 drop into both eyes at bedtime., Disp: , Rfl:  .  diphenhydrAMINE-Acetaminophen (TYLENOL COLD RELIEF PO), Take by mouth as needed., Disp: , Rfl:  .  fluticasone (VERAMYST) 27.5  MCG/SPRAY nasal spray, Place 2 sprays into the nose daily., Disp: , Rfl:  .  ipratropium (ATROVENT) 0.03 % nasal spray, Place 2  sprays into both nostrils every 12 (twelve) hours., Disp: 30 mL, Rfl: 12 .  lisinopril-hydrochlorothiazide (ZESTORETIC) 20-25 MG tablet, Take 1 tablet by mouth daily., Disp: , Rfl:  .  Melatonin 3 MG TABS, Take 3 mg by mouth at bedtime., Disp: , Rfl:  .  metoprolol tartrate (LOPRESSOR) 25 MG tablet, Take 1 tablet (25 mg total) by mouth 2 (two) times daily., Disp: 60 tablet, Rfl: 3 .  Multiple Vitamins-Minerals (CENTRUM SILVER 50+MEN) TABS, Take 2 tablets by mouth daily., Disp: , Rfl:

## 2020-10-01 NOTE — Patient Instructions (Signed)
Use ipratropium nasal spray, 2 sprays per nostril twice daily  Continue to use fluticasone nasal spray, 1 spray daily  Use Mucinex (Guaifenisin) 600mg  twice daily as needed  - can pick this medicine up over the counter at pharmacy  Call in 7-10 days if still have issue and will send in an antibiotic

## 2021-01-08 ENCOUNTER — Encounter: Payer: Self-pay | Admitting: Primary Care

## 2021-01-08 ENCOUNTER — Other Ambulatory Visit (HOSPITAL_COMMUNITY): Payer: Self-pay

## 2021-01-08 ENCOUNTER — Encounter (INDEPENDENT_AMBULATORY_CARE_PROVIDER_SITE_OTHER): Payer: Self-pay | Admitting: Otolaryngology

## 2021-01-08 ENCOUNTER — Ambulatory Visit (INDEPENDENT_AMBULATORY_CARE_PROVIDER_SITE_OTHER): Payer: Medicare HMO | Admitting: Otolaryngology

## 2021-01-08 ENCOUNTER — Ambulatory Visit (INDEPENDENT_AMBULATORY_CARE_PROVIDER_SITE_OTHER): Payer: Medicare HMO | Admitting: Primary Care

## 2021-01-08 ENCOUNTER — Other Ambulatory Visit: Payer: Self-pay

## 2021-01-08 VITALS — Temp 97.2°F

## 2021-01-08 DIAGNOSIS — J0100 Acute maxillary sinusitis, unspecified: Secondary | ICD-10-CM | POA: Insufficient documentation

## 2021-01-08 DIAGNOSIS — J310 Chronic rhinitis: Secondary | ICD-10-CM

## 2021-01-08 DIAGNOSIS — J0101 Acute recurrent maxillary sinusitis: Secondary | ICD-10-CM | POA: Diagnosis not present

## 2021-01-08 DIAGNOSIS — H8101 Meniere's disease, right ear: Secondary | ICD-10-CM | POA: Diagnosis not present

## 2021-01-08 MED ORDER — DOXYCYCLINE CALCIUM 50 MG/5ML PO SYRP
100.0000 mg | ORAL_SOLUTION | Freq: Two times a day (BID) | ORAL | 0 refills | Status: AC
  Filled 2021-01-08 – 2021-01-10 (×3): qty 140, 7d supply, fill #0

## 2021-01-08 MED ORDER — LORATADINE 5 MG/5ML PO SYRP
10.0000 mg | ORAL_SOLUTION | Freq: Every day | ORAL | 3 refills | Status: DC | PRN
  Filled 2021-01-08: qty 180, 18d supply, fill #0

## 2021-01-08 NOTE — Patient Instructions (Addendum)
Recommendations: Continue Atrovent nasal spray Continue flonase nasal spray  Rx: Start loratadine (claritin)- take 29ml daily as needed for allergy symptoms  Start Doxycycline 10mg  twice daily x 7 days for sinusitis   Follow-up: Sept-October with Dr. (recall should be in, please check)    Allergies, Adult An allergy means that your body reacts to something that bothers it (allergen). This can happen from something that you eat, breathe in, or touch. Allergies often affect the nose, eyes, skin, and stomach. They can be mild, moderate, or very bad (severe). An allergy cannot spread from person to person. They can happen at any age. Sometimes, people outgrow them. What are the causes?  Outdoor things, such as pollen, car fumes, and mold.  Indoor things, such as dust, smoke, mold, and pets.  Foods.  Medicines.  Things that bother your skin, such as perfume and bug bites. What increases the risk?  Having family members with allergies or asthma. What are the signs or symptoms? Symptoms depend on how bad your allergy is. Mild to moderate symptoms  Runny nose, stuffy nose, or sneezing.  Itchy mouth, ears, or throat.  A feeling of mucus dripping down the back of your throat.  Sore throat.  Eyes that are itchy, red, watery, or puffy.  A skin rash, or red, swollen areas of skin (hives).  Stomach cramps or bloating. Severe symptoms Very bad allergies to food, medicine, or bug bites may cause a very bad allergy reaction (anaphylaxis). This can be life-threatening. Symptoms include:  A red face.  Wheezing or coughing.  Swollen lips, tongue, or mouth.  Tight or swollen throat.  Chest pain or tightness, or a fast heartbeat.  Trouble breathing or shortness of breath.  Pain in your belly (abdomen), vomiting, or watery poop (diarrhea).  Feeling dizzy or fainting. How is this treated? Treatment for this condition depends on your symptoms. Treatment may  include:  Cold, wet cloths for itching and swelling.  Eye drops, nose sprays, or skin creams.  Washing out your nose each day.  A humidifier.  Medicines.  A change to the foods you eat.  Being exposed again and again to tiny amounts of allergens. This helps your body get used to them. You might have: ? Allergy shots. ? Very small amounts of allergen put under your tongue.  An emergency shot (auto-injector pen) if you have a very bad allergy reaction. ? This is a medicine with a needle. You can put it into your skin by yourself. ? Your doctor will teach you how to use it.      Follow these instructions at home: Medicines  Take or apply over-the-counter and prescription medicines only as told by your doctor.  If you are at risk for a very bad allergy reaction, keep an auto-injector pen with you all the time.   Eating and drinking  Follow instructions from your doctor about what to eat and drink.  Drink enough fluid to keep your pee (urine) pale yellow. General instructions  If you have ever had a very bad allergy reaction, wear a medical alert bracelet or necklace.  Stay away from things that you are allergic to.  Keep all follow-up visits as told by your doctor. This is important. Contact a doctor if:  Your symptoms do not get better with treatment. Get help right away if:  You have symptoms of a very bad allergy reaction. These include: ? A swollen mouth, tongue, or throat. ? Pain or tightness in your chest. ?  Trouble breathing. ? Being short of breath. ? Dizziness. ? Fainting. ? Very bad pain in your belly. ? Vomiting. ? Watery poop. These symptoms may be an emergency. Do not wait to see if the symptoms will go away. Get medical help right away. Call your local emergency services (911 in the [place] Do not drive yourself to the hospital. Summary  Take or apply over-the-counter and prescription medicines only as told by your doctor.  Stay away from things  you are allergic to.  If you are at risk for a very bad allergy reaction, carry an auto-injector pen all the time.  Wear a medical alert bracelet or necklace.  Very bad allergy reactions can be life-threatening. Get help right away. This information is not intended to replace advice given to you by your health care provider. Make sure you discuss any questions you have with your health care provider. Document Revised: 05/31/2019 Document Reviewed: 05/31/2019 Elsevier Patient Education  2021 [employer]  Sinusitis, Adult Sinusitis is soreness and swelling (inflammation) of your sinuses. Sinuses are hollow spaces in the bones around your face. They are located:  Around your eyes.  In the middle of your forehead.  Behind your nose.  In your cheekbones. Your sinuses and nasal passages are lined with a fluid called mucus. Mucus drains out of your sinuses. Swelling can trap mucus in your sinuses. This lets germs (bacteria, virus, or fungus) grow, which leads to infection. Most of the time, this condition is caused by a virus. What are the causes? This condition is caused by:  Allergies.  Asthma.  Germs.  Things that block your nose or sinuses.  Growths in the nose (nasal polyps).  Chemicals or irritants in the air.  Fungus (rare). What increases the risk? You are more likely to develop this condition if:  You have a weak body defense system (immune system).  You do a lot of swimming or diving.  You use nasal sprays too much.  You smoke. What are the signs or symptoms? The main symptoms of this condition are pain and a feeling of pressure around the sinuses. Other symptoms include:  Stuffy nose (congestion).  Runny nose (drainage).  Swelling and warmth in the sinuses.  Headache.  Toothache.  A cough that may get worse at night.  Mucus that collects in the throat or the back of the nose (postnasal drip).  Being unable to smell and taste.  Being very tired  (fatigue).  A fever.  Sore throat.  Bad breath. How is this diagnosed? This condition is diagnosed based on:  Your symptoms.  Your medical history.  A physical exam.  Tests to find out if your condition is short-term (acute) or long-term (chronic). Your doctor may: ? Check your nose for growths (polyps). ? Check your sinuses using a tool that has a light (endoscope). ? Check for allergies or germs. ? Do imaging tests, such as an MRI or CT scan. How is this treated? Treatment for this condition depends on the cause and whether it is short-term or long-term.  If caused by a virus, your symptoms should go away on their own within 10 days. You may be given medicines to relieve symptoms. They include: ? Medicines that shrink swollen tissue in the nose. ? Medicines that treat allergies (antihistamines). ? A spray that treats swelling of the nostrils. ? Rinses that help get rid of thick mucus in your nose (nasal saline washes).  If caused by bacteria, your doctor may wait to  see if you will get better without treatment. You may be given antibiotic medicine if you have: ? A very bad infection. ? A weak body defense system.  If caused by growths in the nose, you may need to have surgery. Follow these instructions at home: Medicines  Take, use, or apply over-the-counter and prescription medicines only as told by your doctor. These may include nasal sprays.  If you were prescribed an antibiotic medicine, take it as told by your doctor. Do not stop taking the antibiotic even if you start to feel better. Hydrate and humidify  Drink enough water to keep your pee (urine) pale yellow.  Use a cool mist humidifier to keep the humidity level in your home above 50%.  Breathe in steam for 10-15 minutes, 3-4 times a day, or as told by your doctor. You can do this in the bathroom while a hot shower is running.  Try not to spend time in cool or dry air.   Rest  Rest as much as you  can.  Sleep with your head raised (elevated).  Make sure you get enough sleep each night. General instructions  Put a warm, moist washcloth on your face 3-4 times a day, or as often as told by your doctor. This will help with discomfort.  Wash your hands often with soap and water. If there is no soap and water, use hand sanitizer.  Do not smoke. Avoid being around people who are smoking (secondhand smoke).  Keep all follow-up visits as told by your doctor. This is important.   Contact a doctor if:  You have a fever.  Your symptoms get worse.  Your symptoms do not get better within 10 days. Get help right away if:  You have a very bad headache.  You cannot stop throwing up (vomiting).  You have very bad pain or swelling around your face or eyes.  You have trouble seeing.  You feel confused.  Your neck is stiff.  You have trouble breathing. Summary  Sinusitis is swelling of your sinuses. Sinuses are hollow spaces in the bones around your face.  This condition is caused by tissues in your nose that become inflamed or swollen. This traps germs. These can lead to infection.  If you were prescribed an antibiotic medicine, take it as told by your doctor. Do not stop taking it even if you start to feel better.  Keep all follow-up visits as told by your doctor. This is important. This information is not intended to replace advice given to you by your health care provider. Make sure you discuss any questions you have with your health care provider. Document Revised: 12/20/2017 Document Reviewed: 12/20/2017 Elsevier Patient Education  2021 [employer].

## 2021-01-08 NOTE — Assessment & Plan Note (Signed)
-   Patient reports moderate amount of nasal congestion x 1 month. He does not have a lot of associated respiratory complaints. Symptoms are mainly driven by seasonal allergic rhinitis. He is complaint with both Ipratropium and fluticasone nasal sprays. Sending in RX for Doxyxyline 100mg  BID x 7 days and adding loratadine 10mg  daily prn allergy symptoms. May need to consider referral to ENT if symtpoms persist. He is due for regular follow-up in September with Dr. .

## 2021-01-08 NOTE — Progress Notes (Signed)
@Patient  ID: , male    DOB: 10-09-1945, 75 y.o.   MRN: [id number]  Chief Complaint  Patient presents with  . Follow-up    Referring provider: [id number], MD  HPI: 75 year old male, never smoked.  Past medical history hypertension, pneumonia due to COVID-19 in October 2020 (treated with remdesivir and steroids), GERD, type 2 diabetes.  Patient of Dr. November 2020 last seen in office on 10/01/2020.  01/08/2021 - Interim hx  Patient presents today for acute visit. Patient had COVID 19 pneumonia back in October 2020. Follows with our office for chronic rhinitis and sinus congestion. He developed nasal congestion 1 month ago. He has been using both Ipratropium and Flonase nasal sprays as prescribed. He is not taking any over the counter antihistamine. He tells me that he can not take Mucinex. He has been using Albuterol rescue inhaler once a day which helps him breath better. Denies f/c/s, shortness of breath or cough.    Allergies  Allergen Reactions  . Penicillins Anaphylaxis    Did it involve swelling of the face/tongue/throat, SOB, or low BP? Yes Did it involve sudden or severe rash/hives, skin peeling, or any reaction on the inside of your mouth or nose? No Did you need to seek medical attention at a hospital or doctor's office? No When did it last happen? If all above answers are "NO", may proceed with cephalosporin use.   . Trazodone And Nefazodone Other (See Comments)    Confusion, agitation, and hyperalert  . Ativan [Lorazepam] Other (See Comments)    agitation   . Decongestant [Pseudoephedrine Hcl Er] Other (See Comments)    Elevated blood pressure   . Talwin [Pentazocine] Other (See Comments)    Elevated blood pressure.    Immunization History  Administered Date(s) Administered  . Fluad Quad(high Dose 65+) 07/13/2019  . H1N1 06/12/2008  . Influenza-Unspecified 05/03/2005, 06/16/2006, 07/12/2007, 04/16/2008, 05/03/2008  . Janssen (J&J) SARS-COV-2 Vaccination  11/01/2019  . Pneumococcal Polysaccharide-23 08/04/2003  . Pneumococcal-Unspecified 05/03/2004  . Tdap 08/03/2010    Past Medical History:  Diagnosis Date  . Bronchitis   . Diabetes mellitus without complication (HCC)   . GERD (gastroesophageal reflux disease)   . History of 2019 novel coronavirus disease (COVID-19) 05/2019  . Hypertension     Tobacco History: Social History   Tobacco Use  Smoking Status Never Smoker  Smokeless Tobacco Never Used   Counseling given: Not Answered   Outpatient Medications Prior to Visit  Medication Sig Dispense Refill  . acetaminophen (TYLENOL) 160 MG/5ML elixir Take 15.6 mLs (500 mg total) by mouth every 4 (four) hours as needed for fever. 473 mL 2  . albuterol (VENTOLIN HFA) 108 (90 Base) MCG/ACT inhaler Inhale 2 puffs into the lungs every 6 (six) hours as needed for wheezing or shortness of breath. 8 g 6  . albuterol (VENTOLIN HFA) 108 (90 Base) MCG/ACT inhaler INHALE 2 PUFFS BY MOUTH INTO THE LUNGS EVERY 6 HOURS AS NEEDED FOR WHEEZING OR SHORTNESS OF BREATH 8.5 g 6  . albuterol (VENTOLIN HFA) 108 (90 Base) MCG/ACT inhaler INHALE 2 PUFFS INTO THE LUNGS EVERY 6 HOURS AS NEEDED FOR WHEEZING OR SHORTNESS OF BREATH. 18 g 6  . bimatoprost (LUMIGAN) 0.03 % ophthalmic solution Place 1 drop into both eyes at bedtime.    . diphenhydrAMINE-Acetaminophen (TYLENOL COLD RELIEF PO) Take by mouth as needed.    . fluticasone (VERAMYST) 27.5 MCG/SPRAY nasal spray Place 2 sprays into the nose daily.    06/2019 ipratropium (ATROVENT)  0.03 % nasal spray Place 2 sprays into both nostrils every 12 (twelve) hours. 30 mL 12  . ipratropium (ATROVENT) 0.03 % nasal spray PLACE 2 SPRAYS INTO BOTH NOSTRILS EVERY 12 HOURS 30 mL 12  . lisinopril-hydrochlorothiazide (ZESTORETIC) 20-25 MG tablet Take 1 tablet by mouth daily.    . Melatonin 3 MG TABS Take 3 mg by mouth at bedtime.    . metoprolol tartrate (LOPRESSOR) 25 MG tablet Take 1 tablet (25 mg total) by mouth 2 (two) times  daily. 60 tablet 3  . Multiple Vitamins-Minerals (CENTRUM SILVER 50+MEN) TABS Take 2 tablets by mouth daily.     No facility-administered medications prior to visit.   Review of Systems  Review of Systems  Constitutional: Negative.   HENT: Positive for congestion and postnasal drip.   Respiratory: Negative for cough, chest tightness, shortness of breath and wheezing.   Cardiovascular: Negative.     Physical Exam  BP 122/70 (BP Location: Left Arm, Patient Position: Sitting, Cuff Size: Normal)   Pulse 84   Temp 98.6 F (37 C) (Temporal)   Ht 5\' 7"  (1.702 m)   Wt 162 lb (73.5 kg)   SpO2 97%   BMI 25.37 kg/m  Physical Exam Constitutional:      Appearance: Normal appearance.  HENT:     Head: Normocephalic and atraumatic.     Nose: Congestion present. No nasal deformity.     Right Sinus: Maxillary sinus tenderness and frontal sinus tenderness present.     Left Sinus: Maxillary sinus tenderness and frontal sinus tenderness present.     Mouth/Throat:     Mouth: Mucous membranes are moist.     Pharynx: Oropharynx is clear.  Cardiovascular:     Rate and Rhythm: Normal rate and regular rhythm.  Pulmonary:     Effort: Pulmonary effort is normal.     Breath sounds: Normal breath sounds.     Comments: CTA Neurological:     General: No focal deficit present.     Mental Status: He is alert and oriented to person, place, and time. Mental status is at baseline.  Psychiatric:        Mood and Affect: Mood normal.        Behavior: Behavior normal.        Thought Content: Thought content normal.        Judgment: Judgment normal.      Lab Results:  CBC    Component Value Date/Time   WBC 8.0 06/25/2019 2024   RBC 3.60 (L) 06/25/2019 2024   HGB 11.1 (L) 06/25/2019 2024   HCT 34.1 (L) 06/25/2019 2024   PLT 264 06/25/2019 2024   MCV 94.7 06/25/2019 2024   MCH 30.8 06/25/2019 2024   MCHC 32.6 06/25/2019 2024   RDW 13.6 06/25/2019 2024   LYMPHSABS 1.5 06/08/2019 0435   MONOABS  1.1 (H) 06/08/2019 0435   EOSABS 0.0 06/08/2019 0435   BASOSABS 0.0 06/08/2019 0435    BMET    Component Value Date/Time   NA 138 06/25/2019 2024   K 3.7 06/25/2019 2024   CL 105 06/25/2019 2024   CO2 25 06/25/2019 2024   GLUCOSE 254 (H) 06/25/2019 2024   BUN 16 06/25/2019 2024   CREATININE 1.00 06/25/2019 2024   CALCIUM 8.7 (L) 06/25/2019 2024   GFRNONAA >60 06/25/2019 2024   GFRAA >60 06/25/2019 2024    BNP    Component Value Date/Time   BNP 15.2 01/12/2015 0555    ProBNP    Component  Value Date/Time   PROBNP 14.5 07/17/2014 0441    Imaging: No results found.   Assessment & Plan:   Sinusitis, acute maxillary - Patient reports moderate amount of nasal congestion x 1 month. He does not have a lot of associated respiratory complaints. Symptoms are mainly driven by seasonal allergic rhinitis. He is complaint with both Ipratropium and fluticasone nasal sprays. Sending in RX for Doxyxyline 100mg  BID x 7 days and adding loratadine 10mg  daily prn allergy symptoms. May need to consider referral to ENT if symtpoms persist. He is due for regular follow-up in September with Dr. .      October, NP 01/08/2021

## 2021-01-08 NOTE — Progress Notes (Signed)
HPI: Ethan Pierce is a 75 y.o. male who returns today for evaluation of sinuses with congestion.  He denies any yellow-green discharge from his nose but does have a lot of mucus.  He has been using Flonase which seems to help as well saline rinses.  He also has pulmonary problems and has seen pulmonary earlier today and was treated with a round of doxycycline 100 mg twice daily for a week.  He is on inhalers for his pulmonary disease. He also carries a diagnosis of Mnire's disease of the right ear and is treated at the [place] [facility] for the Mnire's disease.  He describes some congestion in his nose but is breathing reasonably well today..  Past Medical History:  Diagnosis Date  . Bronchitis   . Diabetes mellitus without complication (HCC)   . GERD (gastroesophageal reflux disease)   . History of 2019 novel coronavirus disease (COVID-19) 05/2019  . Hypertension    Past Surgical History:  Procedure Laterality Date  . CATARACT EXTRACTION, BILATERAL  2018, 2019  . WISDOM TOOTH EXTRACTION  05/2019   Social History   Socioeconomic History  . Marital status: Married    Spouse name: Not on file  . Number of children: 2  . Years of education: Not on file  . Highest education level: Not on file  Occupational History  . Not on file  Tobacco Use  . Smoking status: Never Smoker  . Smokeless tobacco: Never Used  Vaping Use  . Vaping Use: Never used  Substance and Sexual Activity  . Alcohol use: No  . Drug use: No  . Sexual activity: Yes  Other Topics Concern  . Not on file  Social History Narrative  . Not on file   Social Determinants of Health   Financial Resource Strain: Not on file  Food Insecurity: Not on file  Transportation Needs: Not on file  Physical Activity: Not on file  Stress: Not on file  Social Connections: Not on file   Family History  Problem Relation Age of Onset  . Diabetes Mother   . GER disease Mother   . Cancer Father        pancreas  . Diabetes  Sister   . Diabetes Brother    Allergies  Allergen Reactions  . Penicillins Anaphylaxis    Did it involve swelling of the face/tongue/throat, SOB, or low BP? Yes Did it involve sudden or severe rash/hives, skin peeling, or any reaction on the inside of your mouth or nose? No Did you need to seek medical attention at a hospital or doctor's office? No When did it last happen? If all above answers are "NO", may proceed with cephalosporin use.   . Trazodone And Nefazodone Other (See Comments)    Confusion, agitation, and hyperalert  . Ativan [Lorazepam] Other (See Comments)    agitation   . Decongestant [Pseudoephedrine Hcl Er] Other (See Comments)    Elevated blood pressure   . Talwin [Pentazocine] Other (See Comments)    Elevated blood pressure.   Prior to Admission medications   Medication Sig Start Date End Date Taking? Authorizing Provider  acetaminophen (TYLENOL) 160 MG/5ML elixir Take 15.6 mLs (500 mg total) by mouth every 4 (four) hours as needed for fever. 05/29/19   Arby Barrette, MD  albuterol (VENTOLIN HFA) 108 (90 Base) MCG/ACT inhaler Inhale 2 puffs into the lungs every 6 (six) hours as needed for wheezing or shortness of breath. 08/20/20   Martina Sinner, MD  albuterol (VENTOLIN  HFA) 108 (90 Base) MCG/ACT inhaler INHALE 2 PUFFS BY MOUTH INTO THE LUNGS EVERY 6 HOURS AS NEEDED FOR WHEEZING OR SHORTNESS OF BREATH 08/20/20 08/20/21  Martina Sinner, MD  albuterol (VENTOLIN HFA) 108 (90 Base) MCG/ACT inhaler INHALE 2 PUFFS INTO THE LUNGS EVERY 6 HOURS AS NEEDED FOR WHEEZING OR SHORTNESS OF BREATH. 06/10/20 06/10/21  Parrett, Tammy S, NP  bimatoprost (LUMIGAN) 0.03 % ophthalmic solution Place 1 drop into both eyes at bedtime.    [provider]  diphenhydrAMINE-Acetaminophen (TYLENOL COLD RELIEF PO) Take by mouth as needed.    [provider]  doxycycline (VIBRAMYCIN) 50 MG/5ML SYRP Take 10 mLs (100 mg total) by mouth 2 (two) times daily for 7 days.  01/08/21 01/15/21  Glenford Bayley, NP  fluticasone (VERAMYST) 27.5 MCG/SPRAY nasal spray Place 2 sprays into the nose daily.    [provider]  ipratropium (ATROVENT) 0.03 % nasal spray Place 2 sprays into both nostrils every 12 (twelve) hours. 08/20/20   Martina Sinner, MD  ipratropium (ATROVENT) 0.03 % nasal spray PLACE 2 SPRAYS INTO BOTH NOSTRILS EVERY 12 HOURS 08/20/20 08/20/21  Martina Sinner, MD  lisinopril-hydrochlorothiazide (ZESTORETIC) 20-25 MG tablet Take 1 tablet by mouth daily.    [provider]  loratadine (SB LORATADINE) 5 MG/5ML syrup Take 10 mLs (10 mg total) by mouth daily as needed for allergies or rhinitis. 01/08/21   Glenford Bayley, NP  Melatonin 3 MG TABS Take 3 mg by mouth at bedtime.    [provider]  metoprolol tartrate (LOPRESSOR) 25 MG tablet Take 1 tablet (25 mg total) by mouth 2 (two) times daily. 06/08/19   Marinda Elk, MD  Multiple Vitamins-Minerals (CENTRUM SILVER 50+MEN) TABS Take 2 tablets by mouth daily.    [provider]     Positive ROS: Otherwise negative  All other systems have been reviewed and were otherwise negative with the exception of those mentioned in the HPI and as above.  Physical Exam: Constitutional: Alert, well-appearing, no acute distress Ears: External ears without lesions or tenderness. Ear canals are clear bilaterally with intact, clear TMs bilaterally.  On hearing screening with a 512 1024 tuning fork he hears little better in the left compared to the right with a mild right ear sensorineural hearing loss. Nasal: External nose without lesions. Septum with minimal deformity..  On nasal endoscopy he appears to have had surgery in the sinus region but patient states that he has had no surgery on his sinuses.  The maxillary and ethmoid sinus openings appear clear with no mucopurulent discharge.  He does have mild edema but mostly just clear mucus discharge from the middle meatus region.   The nasopharynx was clear. Oral: Lips and gums without lesions. Tongue and palate mucosa without lesions. Posterior oropharynx clear.  No purulent discharge in the posterior oropharynx. Neck: No palpable adenopathy or masses Respiratory: Breathing comfortably  Skin: No facial/neck lesions or rash noted. Of note I reviewed a CT scan of his head performed 20 months ago that showed minimal sinus disease with slight mucoperiosteal thickening within the ethmoid area and left maxillary sinus.  Sphenoid and frontal sinuses were clear.  Procedures  Assessment: Chronic rhinitis History of Mnire's disease involving the right ear [facility].  Plan: Reviewed with him concerning his nasal stuffiness and drainage would recommend regular use of the Flonase 2 sprays each nostril at night and saline rinse as needed. He gets his medications for Mnire's  disease from the [place] [facility]. Presently no evidence of acute infection requiring further antibiotic therapy.   Narda Bonds, MD

## 2021-01-09 ENCOUNTER — Other Ambulatory Visit (HOSPITAL_COMMUNITY): Payer: Self-pay

## 2021-01-10 ENCOUNTER — Other Ambulatory Visit (HOSPITAL_COMMUNITY): Payer: Self-pay

## 2021-01-10 ENCOUNTER — Telehealth: Payer: Self-pay | Admitting: Primary Care

## 2021-01-10 MED ORDER — AZITHROMYCIN 200 MG/5ML PO SUSR
ORAL | 0 refills | Status: AC
  Filled 2021-01-10: qty 30, 3d supply, fill #0
  Filled 2021-01-10: qty 30, 2d supply, fill #0

## 2021-01-10 NOTE — Telephone Encounter (Signed)
Ill send in liquid azithromycin

## 2021-01-10 NOTE — Telephone Encounter (Signed)
I tried to call patient to inform him about liquid Zpak being sent in. There was no answer, left detailed message in accordance with DPR stating the med was sent to preferred pharmacy and to call with any other questions/concerns. Nothing further needed.

## 2021-01-10 NOTE — Telephone Encounter (Signed)
Called and spoke with the staff at the [facility] outpatient pharmacy.  The liquid Doxycycline is not on the patient's formulary and is not covered by insurance.    Beth,  Please clarify if patient needs liquid Doxycycline as insurance does not cover this.  Thank you.  Assessment & Plan Note by Glenford Bayley, NP at 01/08/2021 9:27 AM  Author: Glenford Bayley, NP Author Type: Nurse Practitioner Filed: 01/08/2021  9:30 AM  Note Status: Written Cosign: Cosign Not Required Encounter Date: 01/08/2021  Problem: Sinusitis, acute maxillary  Editor: Glenford Bayley, NP (Nurse Practitioner)               - Patient reports moderate amount of nasal congestion x 1 month. He does not have a lot of associated respiratory complaints. Symptoms are mainly driven by seasonal allergic rhinitis. He is complaint with both Ipratropium and fluticasone nasal sprays. Sending in RX for Doxyxyline 100  mg BID x 7 days and adding loratadine 10mg  daily prn allergy symptoms. May need to consider referral to ENT if symtpoms persist. He is due for regular follow-up in September with Dr. October.          Patient Instructions by Francine Graven, NP at 01/08/2021 9:00 AM  Author: 03/10/2021, NP Author Type: Nurse Practitioner Filed: 01/08/2021  9:22 AM  Note Status: Addendum Cosign: Cosign Not Required Encounter Date: 01/08/2021  Editor: 03/10/2021, NP (Nurse Practitioner)      Prior Versions: 1. Glenford Bayley, NP (Nurse Practitioner) at 01/08/2021  9:22 AM - Signed    Recommendations: Continue Atrovent nasal spray Continue flonase nasal spray   Rx: Start loratadine (claritin)- take 81ml daily as needed for allergy symptoms Start Doxycycline 10mg  twice daily x 7 days for sinusitis   Follow-up: Sept-October with Dr. 9m (recall should be in, please check)       Allergies, Adult An allergy means that your body reacts to something that bothers it (allergen). This can happen from something that you  eat, breathe in, or touch. Allergies often affect the nose, eyes, skin, and stomach. They can be mild, moderate, or very bad (severe). An allergy cannot spread from person to person. They can happen at any age. Sometimes, people outgrow them. What are the causes? Outdoor things, such as pollen, car fumes, and mold. Indoor things, such as dust, smoke, mold, and pets. Foods. Medicines. Things that bother your skin, such as perfume and bug bites. What increases the risk? Having family members with allergies or asthma. What are the signs or symptoms? Symptoms depend on how bad your allergy is. Mild to moderate symptoms Runny nose, stuffy nose, or sneezing. Itchy mouth, ears, or throat. A feeling of mucus dripping down the back of your throat. Sore throat. Eyes that are itchy, red, watery, or puffy. A skin rash, or red, swollen areas of skin (hives). Stomach cramps or bloating. Severe symptoms Very bad allergies to food, medicine, or bug bites may cause a very bad allergy reaction (anaphylaxis). This can be life-threatening. Symptoms include: A red face. Wheezing or coughing. Swollen lips, tongue, or mouth. Tight or swollen throat. Chest pain or tightness, or a fast heartbeat. Trouble breathing or shortness of breath. Pain in your belly (abdomen), vomiting, or watery poop (diarrhea). Feeling dizzy or fainting. How is this treated? Treatment for this condition depends on your symptoms. Treatment may include: Cold, wet cloths for itching and swelling. Eye drops, nose sprays, or skin creams. Washing out your nose  each day. A humidifier. Medicines. A change to the foods you eat. Being exposed again and again to tiny amounts of allergens. This helps your body get used to them. You might have: Allergy shots. Very small amounts of allergen put under your tongue. An emergency shot (auto-injector pen) if you have a very bad allergy reaction. This is a medicine with a needle. You can put  it into your skin by yourself. Your doctor will teach you how to use it.      Follow these instructions at home: Medicines Take or apply over-the-counter and prescription medicines only as told by your doctor. If you are at risk for a very bad allergy reaction, keep an auto-injector pen with you all the time.   Eating and drinking Follow instructions from your doctor about what to eat and drink. Drink enough fluid to keep your pee (urine) pale yellow. General instructions If you have ever had a very bad allergy reaction, wear a medical alert bracelet or necklace. Stay away from things that you are allergic to. Keep all follow-up visits as told by your doctor. This is important. Contact a doctor if: Your symptoms do not get better with treatment. Get help right away if: You have symptoms of a very bad allergy reaction. These include: A swollen mouth, tongue, or throat. Pain or tightness in your chest. Trouble breathing. Being short of breath. Dizziness. Fainting. Very bad pain in your belly. Vomiting. Watery poop. These symptoms may be an emergency. Do not wait to see if the symptoms will go away. Get medical help right away. Call your local emergency services (911 in the [place] Do not drive yourself to the hospital. Summary Take or apply over-the-counter and prescription medicines only as told by your doctor. Stay away from things you are allergic to. If you are at risk for a very bad allergy reaction, carry an auto-injector pen all the time. Wear a medical alert bracelet or necklace. Very bad allergy reactions can be life-threatening. Get help right away. This information is not intended to replace advice given to you by your health care provider. Make sure you discuss any questions you have with your health care provider. Document Revised: 05/31/2019 Document Reviewed: 05/31/2019 Elsevier Patient Education  2021 Else[employer] Inc.   Sinusitis, Adult  Sinusitis is soreness and  swelling (inflammation) of your sinuses. Sinuses are hollow spaces in the bones around your face. They are located: Around your eyes. In the middle of your forehead. Behind your nose. In your cheekbones. Your sinuses and nasal passages are lined with a fluid called mucus. Mucus drains out of your sinuses. Swelling can trap mucus in your sinuses. This lets germs (bacteria, virus, or fungus) grow, which leads to infection. Most of the time, this condition is caused by a virus. What are the causes? This condition is caused by: Allergies. Asthma. Germs. Things that block your nose or sinuses. Growths in the nose (nasal polyps). Chemicals or irritants in the air. Fungus (rare). What increases the risk? You are more likely to develop this condition if: You have a weak body defense system (immune system). You do a lot of swimming or diving. You use nasal sprays too much. You smoke. What are the signs or symptoms? The main symptoms of this condition are pain and a feeling of pressure around the sinuses. Other symptoms include: Stuffy nose (congestion). Runny nose (drainage). Swelling and warmth in the sinuses. Headache. Toothache. A cough that may get worse at night. Mucus that collects  in the throat or the back of the nose (postnasal drip). Being unable to smell and taste. Being very tired (fatigue). A fever. Sore throat. Bad breath. How is this diagnosed? This condition is diagnosed based on: Your symptoms. Your medical history. A physical exam. Tests to find out if your condition is short-term (acute) or long-term (chronic). Your doctor may: Check your nose for growths (polyps). Check your sinuses using a tool that has a light (endoscope). Check for allergies or germs. Do imaging tests, such as an MRI or CT scan. How is this treated? Treatment for this condition depends on the cause and whether it is short-term or long-term. If caused by a virus, your symptoms should go away  on their own within 10 days. You may be given medicines to relieve symptoms. They include: Medicines that shrink swollen tissue in the nose. Medicines that treat allergies (antihistamines). A spray that treats swelling of the nostrils.  Rinses that help get rid of thick mucus in your nose (nasal saline washes). If caused by bacteria, your doctor may wait to see if you will get better without treatment. You may be given antibiotic medicine if you have: A very bad infection. A weak body defense system. If caused by growths in the nose, you may need to have surgery. Follow these instructions at home: Medicines Take, use, or apply over-the-counter and prescription medicines only as told by your doctor. These may include nasal sprays. If you were prescribed an antibiotic medicine, take it as told by your doctor. Do not stop taking the antibiotic even if you start to feel better. Hydrate and humidify Drink enough water to keep your pee (urine) pale yellow. Use a cool mist humidifier to keep the humidity level in your home above 50%. Breathe in steam for 10-15 minutes, 3-4 times a day, or as told by your doctor. You can do this in the bathroom while a hot shower is running. Try not to spend time in cool or dry air.   Rest Rest as much as you can. Sleep with your head raised (elevated). Make sure you get enough sleep each night. General instructions Put a warm, moist washcloth on your face 3-4 times a day, or as often as told by your doctor. This will help with discomfort. Wash your hands often with soap and water. If there is no soap and water, use hand sanitizer. Do not smoke. Avoid being around people who are smoking (secondhand smoke). Keep all follow-up visits as told by your doctor. This is important.   Contact a doctor if: You have a fever. Your symptoms get worse. Your symptoms do not get better within 10 days. Get help right away if: You have a very bad headache. You cannot stop  throwing up (vomiting). You have very bad pain or swelling around your face or eyes. You have trouble seeing. You feel confused. Your neck is stiff. You have trouble breathing. Summary Sinusitis is swelling of your sinuses. Sinuses are hollow spaces in the bones around your face. This condition is caused by tissues in your nose that become inflamed or swollen. This traps germs. These can lead to infection. If you were prescribed an antibiotic medicine, take it as told by your doctor. Do not stop taking it even if you start to feel better. Keep all follow-up visits as told by your doctor. This is important. This information is not intended to replace advice given to you by your health care provider. Make sure you discuss any  questions you have with your health care provider. Document Revised: 12/20/2017 Document Reviewed: 12/20/2017 Elsevier Patient Education  2021 [employer].           Instructions    Return in about 3 months (around 04/10/2021).  Recommendations: Continue Atrovent nasal spray Continue flonase nasal spray   Rx: Start loratadine (claritin)- take 85ml daily as needed for allergy symptoms Start Doxycycline 10mg  twice daily x 7 days for sinusitis   Follow-up: Sept-October with Dr. (recall should be in, please check)

## 2021-01-13 ENCOUNTER — Other Ambulatory Visit (HOSPITAL_COMMUNITY): Payer: Self-pay

## 2021-01-15 DIAGNOSIS — R599 Enlarged lymph nodes, unspecified: Secondary | ICD-10-CM | POA: Diagnosis not present

## 2021-03-10 ENCOUNTER — Other Ambulatory Visit (HOSPITAL_COMMUNITY): Payer: Self-pay

## 2021-03-10 MED FILL — Albuterol Sulfate Inhal Aero 108 MCG/ACT (90MCG Base Equiv): RESPIRATORY_TRACT | 25 days supply | Qty: 8.5 | Fill #0 | Status: AC

## 2021-03-22 DIAGNOSIS — J309 Allergic rhinitis, unspecified: Secondary | ICD-10-CM | POA: Diagnosis not present

## 2021-03-22 DIAGNOSIS — J069 Acute upper respiratory infection, unspecified: Secondary | ICD-10-CM | POA: Diagnosis not present

## 2021-03-22 DIAGNOSIS — R059 Cough, unspecified: Secondary | ICD-10-CM | POA: Diagnosis not present

## 2021-03-25 ENCOUNTER — Telehealth: Payer: Self-pay | Admitting: Pulmonary Disease

## 2021-03-25 NOTE — Telephone Encounter (Signed)
Spoke with patient to let him know the recommendations from Dr. Francine Graven. He expressed understanding and states he will call his PCP. Also asked to be scheduled for his follow up. Patient is now scheduled with Dr. Francine Graven. Nothing further needed at this time.

## 2021-03-25 NOTE — Telephone Encounter (Signed)
Tried calling pt back and there was no answer- LMTCB 

## 2021-03-25 NOTE — Telephone Encounter (Signed)
I agree he should be transitioned from lisinopril to another blood pressure medication as this is well known to cause cough. He should speak with his primary care physician or other provider who has been providing him with this prescription.

## 2021-03-25 NOTE — Telephone Encounter (Signed)
Spoke with the pt  He recently traveled to [place]  While there had to go to ED for his cough- 03/21/21 He says cough has been present for at least 20 years, but got worse while on his trip  He was dx with sinus infection and given abx and steroids  He feels okay, but continues to have a dry, hacking cough  The ED physician talked with him about his lisinopril use and that this can cause cough  He wants to know if we can change this to benicar instead to see if cough goes away  I advised will ask Dr Francine Graven, but he should probably get some input on this from PCP as well  Please advise thanks

## 2021-03-25 NOTE — Telephone Encounter (Signed)
Pt in office, went to urgent care in [place],  was given breathing treatment of Duo neb. Pt was also given Prednisone oral solution 15mg /55mL. Pt states he was told the hacking cough could be caused by Lisinopril. Was told Valsartan 40mg  may be a better choice for him with less side effects. Please advise [phone]

## 2021-03-26 ENCOUNTER — Other Ambulatory Visit (HOSPITAL_COMMUNITY): Payer: Self-pay

## 2021-03-26 DIAGNOSIS — R059 Cough, unspecified: Secondary | ICD-10-CM | POA: Diagnosis not present

## 2021-03-26 MED ORDER — VALSARTAN 40 MG PO TABS
40.0000 mg | ORAL_TABLET | Freq: Every day | ORAL | 1 refills | Status: DC
  Filled 2021-03-26: qty 30, 30d supply, fill #0

## 2021-05-08 DIAGNOSIS — I10 Essential (primary) hypertension: Secondary | ICD-10-CM | POA: Diagnosis not present

## 2021-05-08 DIAGNOSIS — E1140 Type 2 diabetes mellitus with diabetic neuropathy, unspecified: Secondary | ICD-10-CM | POA: Diagnosis not present

## 2021-05-08 DIAGNOSIS — Z125 Encounter for screening for malignant neoplasm of prostate: Secondary | ICD-10-CM | POA: Diagnosis not present

## 2021-05-08 DIAGNOSIS — Z23 Encounter for immunization: Secondary | ICD-10-CM | POA: Diagnosis not present

## 2021-05-08 DIAGNOSIS — R7989 Other specified abnormal findings of blood chemistry: Secondary | ICD-10-CM | POA: Diagnosis not present

## 2021-05-08 DIAGNOSIS — E559 Vitamin D deficiency, unspecified: Secondary | ICD-10-CM | POA: Diagnosis not present

## 2021-05-08 DIAGNOSIS — E785 Hyperlipidemia, unspecified: Secondary | ICD-10-CM | POA: Diagnosis not present

## 2021-05-13 ENCOUNTER — Other Ambulatory Visit (HOSPITAL_COMMUNITY): Payer: Self-pay

## 2021-05-13 ENCOUNTER — Encounter: Payer: Self-pay | Admitting: Pulmonary Disease

## 2021-05-13 ENCOUNTER — Other Ambulatory Visit: Payer: Self-pay

## 2021-05-13 ENCOUNTER — Ambulatory Visit: Payer: Medicare HMO | Admitting: Pulmonary Disease

## 2021-05-13 VITALS — BP 140/82 | HR 74 | Ht 67.0 in | Wt 170.2 lb

## 2021-05-13 DIAGNOSIS — R0982 Postnasal drip: Secondary | ICD-10-CM

## 2021-05-13 MED ORDER — IPRATROPIUM BROMIDE 0.03 % NA SOLN
NASAL | 12 refills | Status: DC
  Filled 2021-05-13: qty 30, 43d supply, fill #0

## 2021-05-13 NOTE — Patient Instructions (Addendum)
Continue ipratropium nasal spray as needed.   You can try mucinex with dextromethorphan or Mucinex DM for cough and chest congestion.

## 2021-05-13 NOTE — Progress Notes (Signed)
Synopsis: Referred in December 2020 for post-COVID by Pearson Grippe, MD.   Subjective:   PATIENT ID: Ethan Pierce GENDER: male DOB: 05-01-1946, MRN: [id number]   HPI  Chief Complaint  Patient presents with   Follow-up    3-4 mo f/u for cough. States his cough has improved since last visit. Stopped Linsopril     Nikan Ellingson is a 75 year old male, never smoker who has been followed in pulmonary clinic after covid 19 infection in 07/2019 for ongoing rhinitis and cough.   He has been doing well since last visit.  His cough is significantly improved since stopping lisinopril.  He continues to have nasal congestion and postnasal drainage.  He reports significant improvement with as needed ipratropium nasal spray use for the sinus congestion and postnasal drainage.  He reports going to an urgent care in [place] while traveling in August where he reported cough and some wheezing.  He was treated with prednisone which helped alleviate his cough and wheezing symptoms.  Past Medical History:  Diagnosis Date   Bronchitis    Diabetes mellitus without complication (HCC)    GERD (gastroesophageal reflux disease)    History of 2019 novel coronavirus disease (COVID-19) 05/2019   Hypertension      Family History  Problem Relation Age of Onset   Diabetes Mother    GER disease Mother    Cancer Father        pancreas   Diabetes Sister    Diabetes Brother      Social History   Socioeconomic History   Marital status: Married    Spouse name: Not on file   Number of children: 2   Years of education: Not on file   Highest education level: Not on file  Occupational History   Not on file  Tobacco Use   Smoking status: Never   Smokeless tobacco: Never  Vaping Use   Vaping Use: Never used  Substance and Sexual Activity   Alcohol use: No   Drug use: No   Sexual activity: Yes  Other Topics Concern   Not on file  Social History Narrative   Not on file   Social Determinants of Health    Financial Resource Strain: Not on file  Food Insecurity: Not on file  Transportation Needs: Not on file  Physical Activity: Not on file  Stress: Not on file  Social Connections: Not on file  Intimate Partner Violence: Not on file     Allergies  Allergen Reactions   Penicillins Anaphylaxis    Did it involve swelling of the face/tongue/throat, SOB, or low BP? Yes Did it involve sudden or severe rash/hives, skin peeling, or any reaction on the inside of your mouth or nose? No Did you need to seek medical attention at a hospital or doctor's office? No When did it last happen?       If all above answers are "NO", may proceed with cephalosporin use.    Trazodone And Nefazodone Other (See Comments)    Confusion, agitation, and hyperalert   Ativan [Lorazepam] Other (See Comments)    agitation    Decongestant [Pseudoephedrine Hcl Er] Other (See Comments)    Elevated blood pressure    Talwin [Pentazocine] Other (See Comments)    Elevated blood pressure.     Outpatient Medications Prior to Visit  Medication Sig Dispense Refill   acetaminophen (TYLENOL) 160 MG/5ML elixir Take 15.6 mLs (500 mg total) by mouth every 4 (four) hours as needed for  fever. 473 mL 2   albuterol (VENTOLIN HFA) 108 (90 Base) MCG/ACT inhaler INHALE 2 PUFFS BY MOUTH INTO THE LUNGS EVERY 6 HOURS AS NEEDED FOR WHEEZING OR SHORTNESS OF BREATH 8.5 g 6   bimatoprost (LUMIGAN) 0.03 % ophthalmic solution Place 1 drop into both eyes at bedtime.     diphenhydrAMINE-Acetaminophen (TYLENOL COLD RELIEF PO) Take by mouth as needed.     fluticasone (VERAMYST) 27.5 MCG/SPRAY nasal spray Place 2 sprays into the nose daily.     loratadine (SB LORATADINE) 5 MG/5ML syrup Take 10 mLs (10 mg total) by mouth daily as needed for allergies or rhinitis. 180 mL 3   losartan (COZAAR) 25 MG tablet Take 25 mg by mouth.     Melatonin 3 MG TABS Take 3 mg by mouth at bedtime.     metoprolol tartrate (LOPRESSOR) 25 MG tablet Take 1 tablet (25 mg  total) by mouth 2 (two) times daily. 60 tablet 3   Multiple Vitamins-Minerals (CENTRUM SILVER 50+MEN) TABS Take 2 tablets by mouth daily.     valsartan (DIOVAN) 40 MG tablet Take 1 tablet (40 mg total) by mouth daily for blood pressure 30 tablet 1   ipratropium (ATROVENT) 0.03 % nasal spray PLACE 2 SPRAYS INTO BOTH NOSTRILS EVERY 12 HOURS 30 mL 12   albuterol (VENTOLIN HFA) 108 (90 Base) MCG/ACT inhaler Inhale 2 puffs into the lungs every 6 (six) hours as needed for wheezing or shortness of breath. 8 g 6   albuterol (VENTOLIN HFA) 108 (90 Base) MCG/ACT inhaler INHALE 2 PUFFS INTO THE LUNGS EVERY 6 HOURS AS NEEDED FOR WHEEZING OR SHORTNESS OF BREATH. 18 g 6   ipratropium (ATROVENT) 0.03 % nasal spray Place 2 sprays into both nostrils every 12 (twelve) hours. 30 mL 12   lisinopril-hydrochlorothiazide (ZESTORETIC) 20-25 MG tablet Take 1 tablet by mouth daily.     No facility-administered medications prior to visit.    Review of Systems  Constitutional:  Negative for chills, fever, malaise/fatigue and weight loss.  HENT:  Positive for congestion. Negative for nosebleeds and sore throat.        Runny nose  Eyes: Negative.   Respiratory:  Positive for cough and sputum production. Negative for hemoptysis, shortness of breath and wheezing.   Cardiovascular:  Negative for chest pain, palpitations, orthopnea, claudication, leg swelling and PND.  Gastrointestinal:  Negative for abdominal pain, heartburn, nausea and vomiting.  Genitourinary: Negative.   Musculoskeletal: Negative.   Skin:  Negative for rash.  Neurological: Negative.   Endo/Heme/Allergies: Negative.   Psychiatric/Behavioral: Negative.     Objective:   Vitals:   05/13/21 1546  BP: 140/82  Pulse: 74  SpO2: 100%  Weight: 170 lb 3.2 oz (77.2 kg)  Height: 5\' 7"  (1.702 m)     Physical Exam Constitutional:      General: He is not in acute distress.    Appearance: Normal appearance. He is normal weight. He is not ill-appearing.   HENT:     Head: Normocephalic and atraumatic.     Nose: Nose normal.     Mouth/Throat:     Mouth: Mucous membranes are moist.     Pharynx: Oropharynx is clear. No oropharyngeal exudate or posterior oropharyngeal erythema.  Eyes:     General: No scleral icterus.    Conjunctiva/sclera: Conjunctivae normal.  Cardiovascular:     Rate and Rhythm: Normal rate and regular rhythm.     Pulses: Normal pulses.     Heart sounds: Normal heart sounds. No murmur  heard. Pulmonary:     Effort: Pulmonary effort is normal.     Breath sounds: Normal breath sounds. No wheezing, rhonchi or rales.  Musculoskeletal:     Right lower leg: No edema.     Left lower leg: No edema.  Skin:    General: Skin is warm and dry.  Neurological:     General: No focal deficit present.     Mental Status: He is alert.    CBC    Component Value Date/Time   WBC 8.0 06/25/2019 2024   RBC 3.60 (L) 06/25/2019 2024   HGB 11.1 (L) 06/25/2019 2024   HCT 34.1 (L) 06/25/2019 2024   PLT 264 06/25/2019 2024   MCV 94.7 06/25/2019 2024   MCH 30.8 06/25/2019 2024   MCHC 32.6 06/25/2019 2024   RDW 13.6 06/25/2019 2024   LYMPHSABS 1.5 06/08/2019 0435   MONOABS 1.1 (H) 06/08/2019 0435   EOSABS 0.0 06/08/2019 0435   BASOSABS 0.0 06/08/2019 0435   BMP Latest Ref Rng & Units 06/25/2019 06/08/2019 06/07/2019  Glucose 70 - 99 mg/dL 941(D) 408(X) 90  BUN 8 - 23 mg/dL 16 44(Y) 18(H)  Creatinine 0.61 - 1.24 mg/dL 6.31 4.97 0.26  Sodium 135 - 145 mmol/L 138 140 143  Potassium 3.5 - 5.1 mmol/L 3.7 3.1(L) 3.1(L)  Chloride 98 - 111 mmol/L 105 102 104  CO2 22 - 32 mmol/L 25 29 27   Calcium 8.9 - 10.3 mg/dL ) 7.9(L) 8.2(L)   Chest imaging: CXR 03/08/20 The heart size and mediastinal contours are within normal limits. Both lungs are clear. The visualized skeletal structures are Unremarkable.  CXR 08/18/19 Heart is borderline in size. Lungs clear. No effusions. No acute bony abnormality.  CXR 06/25/2019 The heart size and  mediastinal contours are within normal limits. Mild bibasilar airspace opacities are similar to prior exam. There is no pleural effusion or pneumothorax. The visualized skeletal structures are unremarkable.  PFT: No flowsheet data found.  Assessment & Plan:   Post-nasal drip - Plan: ipratropium (ATROVENT) 0.03 % nasal spray  Discussion: Charbel Los is a 75 year old male, never smoker who has been followed in pulmonary clinic after covid 19 infection in 07/2019 for ongoing rhinitis and cough.  He continues to have rhinitis and sinus congestion with possible nocturnal post-nasal drainage. He is to continue fluticasone nasal spray daily and ipratropium nasal spray twice daily as needed. He can continue as needed use for albuterol for chest congestion, cough, wheezing or chest tightness.   He can add mucinex twice daily as needed for the phlegm if it is difficult to clear. He can also try Mucinex DM cough syrup.  Follow up in 5 months.   08/2019, MD [facility] Office: [phone]   See Amion for Pager Details      Current Outpatient Medications:    acetaminophen (TYLENOL) 160 MG/5ML elixir, Take 15.6 mLs (500 mg total) by mouth every 4 (four) hours as needed for fever., Disp: 473 mL, Rfl: 2   albuterol (VENTOLIN HFA) 108 (90 Base) MCG/ACT inhaler, INHALE 2 PUFFS BY MOUTH INTO THE LUNGS EVERY 6 HOURS AS NEEDED FOR WHEEZING OR SHORTNESS OF BREATH, Disp: 8.5 g, Rfl: 6   bimatoprost (LUMIGAN) 0.03 % ophthalmic solution, Place 1 drop into both eyes at bedtime., Disp: , Rfl:    diphenhydrAMINE-Acetaminophen (TYLENOL COLD RELIEF PO), Take by mouth as needed., Disp: , Rfl:    fluticasone (VERAMYST) 27.5 MCG/SPRAY nasal spray, Place 2 sprays into the nose  daily., Disp: , Rfl:    loratadine (SB LORATADINE) 5 MG/5ML syrup, Take 10 mLs (10 mg total) by mouth daily as needed for allergies or rhinitis., Disp: 180 mL, Rfl: 3   losartan (COZAAR) 25 MG tablet, Take  25 mg by mouth., Disp: , Rfl:    Melatonin 3 MG TABS, Take 3 mg by mouth at bedtime., Disp: , Rfl:    metoprolol tartrate (LOPRESSOR) 25 MG tablet, Take 1 tablet (25 mg total) by mouth 2 (two) times daily., Disp: 60 tablet, Rfl: 3   Multiple Vitamins-Minerals (CENTRUM SILVER 50+MEN) TABS, Take 2 tablets by mouth daily., Disp: , Rfl:    valsartan (DIOVAN) 40 MG tablet, Take 1 tablet (40 mg total) by mouth daily for blood pressure, Disp: 30 tablet, Rfl: 1   ipratropium (ATROVENT) 0.03 % nasal spray, PLACE 2 SPRAYS INTO BOTH NOSTRILS EVERY 12 HOURS, Disp: 30 mL, Rfl: 12

## 2021-05-17 ENCOUNTER — Encounter: Payer: Self-pay | Admitting: Pulmonary Disease

## 2021-05-19 ENCOUNTER — Other Ambulatory Visit (HOSPITAL_COMMUNITY): Payer: Self-pay

## 2021-05-19 MED FILL — Albuterol Sulfate Inhal Aero 108 MCG/ACT (90MCG Base Equiv): RESPIRATORY_TRACT | 25 days supply | Qty: 8.5 | Fill #1 | Status: AC

## 2021-05-21 ENCOUNTER — Other Ambulatory Visit (HOSPITAL_COMMUNITY): Payer: Self-pay

## 2021-05-28 DIAGNOSIS — H43393 Other vitreous opacities, bilateral: Secondary | ICD-10-CM | POA: Diagnosis not present

## 2021-05-28 DIAGNOSIS — H40033 Anatomical narrow angle, bilateral: Secondary | ICD-10-CM | POA: Diagnosis not present

## 2021-05-28 DIAGNOSIS — H52223 Regular astigmatism, bilateral: Secondary | ICD-10-CM | POA: Diagnosis not present

## 2021-05-28 DIAGNOSIS — H524 Presbyopia: Secondary | ICD-10-CM | POA: Diagnosis not present

## 2021-07-17 ENCOUNTER — Ambulatory Visit: Payer: Medicare HMO | Admitting: Primary Care

## 2021-07-17 NOTE — Progress Notes (Deleted)
@Patient  ID: , male    DOB: [birth year], 75 y.o.   MRN: [id number]  No chief complaint on file.   Referring provider: [id number], DO  HPI: 75 year old male, never smoked.  Past medical history hypertension, pneumonia due to COVID-19 in October 2020 (treated with remdesivir and steroids), GERD, type 2 diabetes.  Patient of Dr. November 2020 last seen in office on 10/01/2020.  Previous [facility] pulmonary encounter: 01/08/2021  Patient presents today for acute visit. Patient had COVID 19 pneumonia back in October 2020. Follows with our office for chronic rhinitis and sinus congestion. He developed nasal congestion 1 month ago. He has been using both Ipratropium and Flonase nasal sprays as prescribed. He is not taking any over the counter antihistamine. He tells me that he can not take Mucinex. He has been using Albuterol rescue inhaler once a day which helps him breath better. Denies f/c/s, shortness of breath or cough.   05/13/21- Dr. 07/13/21 Wesche is a 75 year old male, never smoker who has been followed in pulmonary clinic after covid 19 infection in 07/2019 for ongoing rhinitis and cough.    He has been doing well since last visit.  His cough is significantly improved since stopping lisinopril.  He continues to have nasal congestion and postnasal drainage.  He reports significant improvement with as needed ipratropium nasal spray use for the sinus congestion and postnasal drainage.  He reports going to an urgent care in 08/2019 while traveling in August where he reported cough and some wheezing.  He was treated with prednisone which helped alleviate his cough and wheezing symptoms.  07/17/2021- Interim hx  Patient presents today for acute OV. Hx covid 19, chronic cough and allergic rhinitis.     Requesting breathing treatment?     Allergies  Allergen Reactions   Penicillins Anaphylaxis    Did it involve swelling of the face/tongue/throat, SOB, or low BP? Yes Did it involve  sudden or severe rash/hives, skin peeling, or any reaction on the inside of your mouth or nose? No Did you need to seek medical attention at a hospital or doctor's office? No When did it last happen?       If all above answers are NO, may proceed with cephalosporin use.    Trazodone And Nefazodone Other (See Comments)    Confusion, agitation, and hyperalert   Ativan [Lorazepam] Other (See Comments)    agitation    Decongestant [Pseudoephedrine Hcl Er] Other (See Comments)    Elevated blood pressure    Talwin [Pentazocine] Other (See Comments)    Elevated blood pressure.    Immunization History  Administered Date(s) Administered   Fluad Quad(high Dose 65+) 07/13/2019, 05/07/2021   H1N1 06/12/2008   Influenza-Unspecified 05/03/2005, 06/16/2006, 07/12/2007, 04/16/2008, 05/03/2008   Janssen (J&J) SARS-COV-2 Vaccination 11/01/2019   Pneumococcal Polysaccharide-23 08/04/2003   Pneumococcal-Unspecified 05/03/2004   Tdap 08/03/2010    Past Medical History:  Diagnosis Date   Bronchitis    Diabetes mellitus without complication (HCC)    GERD (gastroesophageal reflux disease)    History of 2019 novel coronavirus disease (COVID-19) 05/2019   Hypertension     Tobacco History: Social History   Tobacco Use  Smoking Status Never  Smokeless Tobacco Never   Counseling given: Not Answered   Outpatient Medications Prior to Visit  Medication Sig Dispense Refill   acetaminophen (TYLENOL) 160 MG/5ML elixir Take 15.6 mLs (500 mg total) by mouth every 4 (four) hours as needed for fever. 473 mL 2  albuterol (VENTOLIN HFA) 108 (90 Base) MCG/ACT inhaler INHALE 2 PUFFS BY MOUTH INTO THE LUNGS EVERY 6 HOURS AS NEEDED FOR WHEEZING OR SHORTNESS OF BREATH 8.5 g 6   bimatoprost (LUMIGAN) 0.03 % ophthalmic solution Place 1 drop into both eyes at bedtime.     diphenhydrAMINE-Acetaminophen (TYLENOL COLD RELIEF PO) Take by mouth as needed.     fluticasone (VERAMYST) 27.5 MCG/SPRAY nasal spray Place 2  sprays into the nose daily.     ipratropium (ATROVENT) 0.03 % nasal spray PLACE 2 SPRAYS INTO BOTH NOSTRILS EVERY 12 HOURS 30 mL 12   loratadine (SB LORATADINE) 5 MG/5ML syrup Take 10 mLs (10 mg total) by mouth daily as needed for allergies or rhinitis. 180 mL 3   losartan (COZAAR) 25 MG tablet Take 25 mg by mouth.     Melatonin 3 MG TABS Take 3 mg by mouth at bedtime.     metoprolol tartrate (LOPRESSOR) 25 MG tablet Take 1 tablet (25 mg total) by mouth 2 (two) times daily. 60 tablet 3   Multiple Vitamins-Minerals (CENTRUM SILVER 50+MEN) TABS Take 2 tablets by mouth daily.     valsartan (DIOVAN) 40 MG tablet Take 1 tablet (40 mg total) by mouth daily for blood pressure 30 tablet 1   No facility-administered medications prior to visit.      Review of Systems  Review of Systems   Physical Exam  There were no vitals taken for this visit. Physical Exam   Lab Results:  CBC    Component Value Date/Time   WBC 8.0 06/25/2019 2024   RBC 3.60 (L) 06/25/2019 2024   HGB 11.1 (L) 06/25/2019 2024   HCT 34.1 (L) 06/25/2019 2024   PLT 264 06/25/2019 2024   MCV 94.7 06/25/2019 2024   MCH 30.8 06/25/2019 2024   MCHC 32.6 06/25/2019 2024   RDW 13.6 06/25/2019 2024   LYMPHSABS 1.5 06/08/2019 0435   MONOABS 1.1 (H) 06/08/2019 0435   EOSABS 0.0 06/08/2019 0435   BASOSABS 0.0 06/08/2019 0435    BMET    Component Value Date/Time   NA 138 06/25/2019 2024   K 3.7 06/25/2019 2024   CL 105 06/25/2019 2024   CO2 25 06/25/2019 2024   GLUCOSE 254 (H) 06/25/2019 2024   BUN 16 06/25/2019 2024   CREATININE 1.00 06/25/2019 2024   CALCIUM 8.7 (L) 06/25/2019 2024   GFRNONAA >60 06/25/2019 2024   GFRAA >60 06/25/2019 2024    BNP    Component Value Date/Time   BNP 15.2 01/12/2015 0555    ProBNP    Component Value Date/Time   PROBNP 14.5 07/17/2014 0441    Imaging: No results found.   Assessment & Plan:   No problem-specific Assessment & Plan notes found for this  encounter.     Glenford Bayley, NP 07/17/2021

## 2021-07-21 ENCOUNTER — Other Ambulatory Visit: Payer: Self-pay

## 2021-07-21 ENCOUNTER — Other Ambulatory Visit (HOSPITAL_COMMUNITY): Payer: Self-pay

## 2021-07-21 ENCOUNTER — Ambulatory Visit: Payer: Medicare HMO | Admitting: Pulmonary Disease

## 2021-07-21 ENCOUNTER — Ambulatory Visit: Payer: Medicare HMO | Admitting: Primary Care

## 2021-07-21 ENCOUNTER — Encounter: Payer: Self-pay | Admitting: Pulmonary Disease

## 2021-07-21 VITALS — BP 138/86 | HR 68 | Ht 67.0 in | Wt 172.6 lb

## 2021-07-21 DIAGNOSIS — J0101 Acute recurrent maxillary sinusitis: Secondary | ICD-10-CM

## 2021-07-21 MED ORDER — PREDNISONE 10 MG PO TABS
20.0000 mg | ORAL_TABLET | Freq: Every day | ORAL | 0 refills | Status: DC
  Filled 2021-07-21: qty 10, 5d supply, fill #0

## 2021-07-21 MED ORDER — PREDNISOLONE SODIUM PHOSPHATE 15 MG/5ML PO SOLN
20.0000 mg | Freq: Every day | ORAL | 0 refills | Status: AC
  Filled 2021-07-21: qty 30, 5d supply, fill #0
  Filled 2021-07-21: qty 20, 2d supply, fill #0

## 2021-07-21 MED ORDER — AZITHROMYCIN 100 MG/5ML PO SUSR
ORAL | 0 refills | Status: DC
  Filled 2021-07-21: qty 6, 5d supply, fill #0
  Filled 2021-07-21: qty 75, 5d supply, fill #0

## 2021-07-21 MED ORDER — PREDNISONE INTENSOL 5 MG/ML PO CONC
20.0000 mg | Freq: Every day | ORAL | 0 refills | Status: DC
  Filled 2021-07-21: qty 25, 6d supply, fill #0

## 2021-07-21 NOTE — Progress Notes (Signed)
Synopsis: Referred in December 2020 for post-COVID by Pearson Grippe, MD.   Subjective:   PATIENT ID: Ethan Pierce GENDER: male DOB: November 01, 1945, MRN: [id number]  HPI  Chief Complaint  Patient presents with   Follow-up    2 mo f/u. Possible sinus infection. Increased facial pressure and green nasal discharge.     Krayton Wortley is a 75 year old male, never smoker who has been followed in pulmonary clinic after covid 19 infection in 07/2019 for ongoing rhinitis and cough.   He presents today for acute visit due to sinus congestion, sinus pressure and dry nasal passages.  He reports green nasal discharge.  This started roughly 5 days ago.  He denies any fevers, chills or sweats.  OV 05/13/21 He has been doing well since last visit.  His cough is significantly improved since stopping lisinopril.  He continues to have nasal congestion and postnasal drainage.  He reports significant improvement with as needed ipratropium nasal spray use for the sinus congestion and postnasal drainage.  He reports going to an urgent care in [place] while traveling in August where he reported cough and some wheezing.  He was treated with prednisone which helped alleviate his cough and wheezing symptoms.  Past Medical History:  Diagnosis Date   Bronchitis    Diabetes mellitus without complication (HCC)    GERD (gastroesophageal reflux disease)    History of 2019 novel coronavirus disease (COVID-19) 05/2019   Hypertension      Family History  Problem Relation Age of Onset   Diabetes Mother    GER disease Mother    Cancer Father        pancreas   Diabetes Sister    Diabetes Brother      Social History   Socioeconomic History   Marital status: Married    Spouse name: Not on file   Number of children: 2   Years of education: Not on file   Highest education level: Not on file  Occupational History   Not on file  Tobacco Use   Smoking status: Never   Smokeless tobacco: Never  Vaping Use   Vaping  Use: Never used  Substance and Sexual Activity   Alcohol use: No   Drug use: No   Sexual activity: Yes  Other Topics Concern   Not on file  Social History Narrative   Not on file   Social Determinants of Health   Financial Resource Strain: Not on file  Food Insecurity: Not on file  Transportation Needs: Not on file  Physical Activity: Not on file  Stress: Not on file  Social Connections: Not on file  Intimate Partner Violence: Not on file     Allergies  Allergen Reactions   Penicillins Anaphylaxis    Did it involve swelling of the face/tongue/throat, SOB, or low BP? Yes Did it involve sudden or severe rash/hives, skin peeling, or any reaction on the inside of your mouth or nose? No Did you need to seek medical attention at a hospital or doctor's office? No When did it last happen?       If all above answers are NO, may proceed with cephalosporin use.    Trazodone And Nefazodone Other (See Comments)    Confusion, agitation, and hyperalert   Ativan [Lorazepam] Other (See Comments)    agitation    Decongestant [Pseudoephedrine Hcl Er] Other (See Comments)    Elevated blood pressure    Talwin [Pentazocine] Other (See Comments)    Elevated blood  pressure.     Outpatient Medications Prior to Visit  Medication Sig Dispense Refill   acetaminophen (TYLENOL) 160 MG/5ML elixir Take 15.6 mLs (500 mg total) by mouth every 4 (four) hours as needed for fever. 473 mL 2   albuterol (VENTOLIN HFA) 108 (90 Base) MCG/ACT inhaler INHALE 2 PUFFS BY MOUTH INTO THE LUNGS EVERY 6 HOURS AS NEEDED FOR WHEEZING OR SHORTNESS OF BREATH 8.5 g 6   bimatoprost (LUMIGAN) 0.03 % ophthalmic solution Place 1 drop into both eyes at bedtime.     diphenhydrAMINE-Acetaminophen (TYLENOL COLD RELIEF PO) Take by mouth as needed.     fluticasone (VERAMYST) 27.5 MCG/SPRAY nasal spray Place 2 sprays into the nose daily.     ipratropium (ATROVENT) 0.03 % nasal spray PLACE 2 SPRAYS INTO BOTH NOSTRILS EVERY 12 HOURS  30 mL 12   loratadine (SB LORATADINE) 5 MG/5ML syrup Take 10 mLs (10 mg total) by mouth daily as needed for allergies or rhinitis. 180 mL 3   losartan (COZAAR) 25 MG tablet Take 25 mg by mouth.     Melatonin 3 MG TABS Take 3 mg by mouth at bedtime.     metoprolol tartrate (LOPRESSOR) 25 MG tablet Take 1 tablet (25 mg total) by mouth 2 (two) times daily. 60 tablet 3   Multiple Vitamins-Minerals (CENTRUM SILVER 50+MEN) TABS Take 2 tablets by mouth daily.     valsartan (DIOVAN) 40 MG tablet Take 1 tablet (40 mg total) by mouth daily for blood pressure 30 tablet 1   No facility-administered medications prior to visit.    Review of Systems  Constitutional:  Negative for chills, fever, malaise/fatigue and weight loss.  HENT:  Positive for congestion and sinus pain. Negative for nosebleeds and sore throat.        Runny nose  Eyes: Negative.   Respiratory:  Negative for cough, hemoptysis, sputum production, shortness of breath and wheezing.   Cardiovascular:  Negative for chest pain, palpitations, orthopnea, claudication, leg swelling and PND.  Gastrointestinal:  Negative for abdominal pain, heartburn, nausea and vomiting.  Genitourinary: Negative.   Musculoskeletal: Negative.   Skin:  Negative for rash.  Neurological: Negative.   Endo/Heme/Allergies: Negative.   Psychiatric/Behavioral: Negative.     Objective:   Vitals:   07/21/21 1131  BP: 138/86  Pulse: 68  SpO2: 100%  Weight: 172 lb 9.6 oz (78.3 kg)  Height: 5\' 7"  (1.702 m)     Physical Exam Constitutional:      General: He is not in acute distress.    Appearance: Normal appearance. He is normal weight. He is not ill-appearing.  HENT:     Head: Normocephalic and atraumatic.     Nose:     Right Sinus: Maxillary sinus tenderness present.     Mouth/Throat:     Mouth: Mucous membranes are moist.     Pharynx: Oropharynx is clear. No oropharyngeal exudate or posterior oropharyngeal erythema.  Eyes:     General: No scleral  icterus.    Conjunctiva/sclera: Conjunctivae normal.  Cardiovascular:     Rate and Rhythm: Normal rate and regular rhythm.     Pulses: Normal pulses.     Heart sounds: Normal heart sounds. No murmur heard. Pulmonary:     Effort: Pulmonary effort is normal.     Breath sounds: Normal breath sounds. No wheezing, rhonchi or rales.  Musculoskeletal:     Right lower leg: No edema.     Left lower leg: No edema.  Skin:    General: Skin  is warm and dry.  Neurological:     General: No focal deficit present.     Mental Status: He is alert.    CBC    Component Value Date/Time   WBC 8.0 06/25/2019 2024   RBC 3.60 (L) 06/25/2019 2024   HGB 11.1 (L) 06/25/2019 2024   HCT 34.1 (L) 06/25/2019 2024   PLT 264 06/25/2019 2024   MCV 94.7 06/25/2019 2024   MCH 30.8 06/25/2019 2024   MCHC 32.6 06/25/2019 2024   RDW 13.6 06/25/2019 2024   LYMPHSABS 1.5 06/08/2019 0435   MONOABS 1.1 (H) 06/08/2019 0435   EOSABS 0.0 06/08/2019 0435   BASOSABS 0.0 06/08/2019 0435   BMP Latest Ref Rng & Units 06/25/2019 06/08/2019 06/07/2019  Glucose 70 - 99 mg/dL 283(T) 517(O) 90  BUN 8 - 23 mg/dL 16 16(W) 73(X)  Creatinine 0.61 - 1.24 mg/dL 1.06 2.69 4.85  Sodium 135 - 145 mmol/L 138 140 143  Potassium 3.5 - 5.1 mmol/L 3.7 3.1(L) 3.1(L)  Chloride 98 - 111 mmol/L 105 102 104  CO2 22 - 32 mmol/L 25 29 27   Calcium 8.9 - 10.3 mg/dL ) 7.9(L) 8.2(L)   Chest imaging: CXR 03/08/20 The heart size and mediastinal contours are within normal limits. Both lungs are clear. The visualized skeletal structures are Unremarkable.  CXR 08/18/19 Heart is borderline in size. Lungs clear. No effusions. No acute bony abnormality.  CXR 06/25/2019 The heart size and mediastinal contours are within normal limits. Mild bibasilar airspace opacities are similar to prior exam. There is no pleural effusion or pneumothorax. The visualized skeletal structures are unremarkable.  PFT: No flowsheet data found.  Assessment & Plan:    Acute recurrent maxillary sinusitis - Plan: azithromycin (ZITHROMAX) 100 MG/5ML suspension, prednisoLONE (ORAPRED) 15 MG/5ML solution, DISCONTINUED: predniSONE (DELTASONE) 10 MG tablet, DISCONTINUED: predniSONE (PREDNISONE INTENSOL) 5 MG/ML concentrated solution  Discussion: Ethan Pierce is a 74 year old male, never smoker who has been followed in pulmonary clinic after covid 19 infection in 07/2019 for ongoing rhinitis and cough.  He appears to have acute right maxillary sinusitis in which we will treat with 5 days of prednisone and a 5-day course of azithromycin.  He is to use Ayr saline gel applied to each nostril at bedtime for dry nasal passages.  He can continue to use ipratropium nasal spray as needed for sinus congestion and postnasal drainage.  Recommend holding fluticasone nasal spray until nose dryness improves.  Follow-up as needed.  08/2019, MD [facility] Office: [phone]   Current Outpatient Medications:    acetaminophen (TYLENOL) 160 MG/5ML elixir, Take 15.6 mLs (500 mg total) by mouth every 4 (four) hours as needed for fever., Disp: 473 mL, Rfl: 2   albuterol (VENTOLIN HFA) 108 (90 Base) MCG/ACT inhaler, INHALE 2 PUFFS BY MOUTH INTO THE LUNGS EVERY 6 HOURS AS NEEDED FOR WHEEZING OR SHORTNESS OF BREATH, Disp: 8.5 g, Rfl: 6   azithromycin (ZITHROMAX) 100 MG/5ML suspension, Take 25 mls by mouth on day 1 then 12.5 mls on days 2-5, Disp: 75 mL, Rfl: 0   bimatoprost (LUMIGAN) 0.03 % ophthalmic solution, Place 1 drop into both eyes at bedtime., Disp: , Rfl:    diphenhydrAMINE-Acetaminophen (TYLENOL COLD RELIEF PO), Take by mouth as needed., Disp: , Rfl:    fluticasone (VERAMYST) 27.5 MCG/SPRAY nasal spray, Place 2 sprays into the nose daily., Disp: , Rfl:    ipratropium (ATROVENT) 0.03 % nasal spray, PLACE 2 SPRAYS INTO BOTH NOSTRILS EVERY 12 HOURS,  Disp: 30 mL, Rfl: 12   loratadine (SB LORATADINE) 5 MG/5ML syrup, Take 10 mLs (10 mg total) by  mouth daily as needed for allergies or rhinitis., Disp: 180 mL, Rfl: 3   losartan (COZAAR) 25 MG tablet, Take 25 mg by mouth., Disp: , Rfl:    Melatonin 3 MG TABS, Take 3 mg by mouth at bedtime., Disp: , Rfl:    metoprolol tartrate (LOPRESSOR) 25 MG tablet, Take 1 tablet (25 mg total) by mouth 2 (two) times daily., Disp: 60 tablet, Rfl: 3   Multiple Vitamins-Minerals (CENTRUM SILVER 50+MEN) TABS, Take 2 tablets by mouth daily., Disp: , Rfl:    prednisoLONE (ORAPRED) 15 MG/5ML solution, Take 6.7 mLs (20 mg total) by mouth daily before breakfast for 5 days., Disp: 50 mL, Rfl: 0

## 2021-07-21 NOTE — Patient Instructions (Addendum)
For the dry nostrils, use Ayr Saline gel applied to each nostril at bedtime  Continue ipratropium nasal spray as needed.   Ok to hold fluticasone nasal spray until nose dryness improves  Take prednisone daily for 5 days  Start z pak for 5 days

## 2021-07-22 ENCOUNTER — Emergency Department (HOSPITAL_COMMUNITY): Payer: No Typology Code available for payment source

## 2021-07-22 ENCOUNTER — Encounter: Payer: Self-pay | Admitting: Pulmonary Disease

## 2021-07-22 ENCOUNTER — Other Ambulatory Visit (HOSPITAL_COMMUNITY): Payer: Self-pay

## 2021-07-22 ENCOUNTER — Emergency Department (HOSPITAL_COMMUNITY)
Admission: EM | Admit: 2021-07-22 | Discharge: 2021-07-23 | Disposition: A | Discharge status: Home / Self Care | Payer: No Typology Code available for payment source | Attending: Emergency Medicine | Admitting: Emergency Medicine

## 2021-07-22 DIAGNOSIS — I700 Atherosclerosis of aorta: Secondary | ICD-10-CM | POA: Diagnosis not present

## 2021-07-22 DIAGNOSIS — R079 Chest pain, unspecified: Secondary | ICD-10-CM | POA: Insufficient documentation

## 2021-07-22 DIAGNOSIS — E119 Type 2 diabetes mellitus without complications: Secondary | ICD-10-CM | POA: Insufficient documentation

## 2021-07-22 DIAGNOSIS — I159 Secondary hypertension, unspecified: Secondary | ICD-10-CM

## 2021-07-22 DIAGNOSIS — I10 Essential (primary) hypertension: Secondary | ICD-10-CM | POA: Diagnosis not present

## 2021-07-22 DIAGNOSIS — Z8616 Personal history of COVID-19: Secondary | ICD-10-CM | POA: Diagnosis not present

## 2021-07-22 LAB — CBC
HCT: 40.5 % (ref 39.0–52.0)
Hemoglobin: 13 g/dL (ref 13.0–17.0)
MCH: 30.1 pg (ref 26.0–34.0)
MCHC: 32.1 g/dL (ref 30.0–36.0)
MCV: 93.8 fL (ref 80.0–100.0)
Platelets: 213 10*3/uL (ref 150–400)
RBC: 4.32 MIL/uL (ref 4.22–5.81)
RDW: 12.1 % (ref 11.5–15.5)
WBC: 11.9 10*3/uL — ABNORMAL HIGH (ref 4.0–10.5)
nRBC: 0 % (ref 0.0–0.2)

## 2021-07-22 MED ORDER — NITROGLYCERIN 0.4 MG SL SUBL
0.4000 mg | SUBLINGUAL_TABLET | SUBLINGUAL | Status: DC | PRN
  Administered 2021-07-22: 0.4 mg via SUBLINGUAL

## 2021-07-22 NOTE — ED Provider Notes (Signed)
Emergency Medicine Provider Triage Evaluation Note  KEVON TENCH , a 75 y.o. male  was evaluated in triage.  Pt complains of chest pain and elevated blood pressure.  Was seen today in the [facility] emergency department and was told to take another dose of his metoprolol.  He did this, but denies any significant improvement in his blood pressure.  He states that he has some central chest discomfort, but has difficulty clarifying when this started.  He saw [facility] recently and was put on prednisone and azithromycin.    Review of Systems  Positive: Chest pain Negative: Fever, chills  Physical Exam  BP (!) 214/85    Pulse 89    Temp 98.3 F (36.8 C) (Oral)    Resp 16    SpO2 98%  Gen:   Awake, no distress   Resp:  Normal effort  MSK:   Moves extremities without difficulty  Other:    Medical Decision Making  Medically screening exam initiated at 11:27 PM.  Appropriate orders placed.  ORLEN LEEDY was informed that the remainder of the evaluation will be completed by another provider, this initial triage assessment does not replace that evaluation, and the importance of remaining in the ED until their evaluation is complete.  CP, elevated BP   Roxy Horseman, PA-C 07/22/21 2329    Melene Plan, DO 07/23/21 [phone]

## 2021-07-22 NOTE — ED Triage Notes (Signed)
Pt c/o HTN & CP, was seen at [facility] for bronchitis & DC w azithromycin & prednisone, says "it all fell apart afterward." advised to take home dose of metoprolol but experienced no relief.

## 2021-07-23 ENCOUNTER — Emergency Department (HOSPITAL_COMMUNITY): Payer: No Typology Code available for payment source

## 2021-07-23 DIAGNOSIS — R079 Chest pain, unspecified: Secondary | ICD-10-CM | POA: Diagnosis not present

## 2021-07-23 DIAGNOSIS — I700 Atherosclerosis of aorta: Secondary | ICD-10-CM | POA: Diagnosis not present

## 2021-07-23 LAB — BASIC METABOLIC PANEL
Anion gap: 8 (ref 5–15)
BUN: 12 mg/dL (ref 8–23)
CO2: 26 mmol/L (ref 22–32)
Calcium: 9 mg/dL (ref 8.9–10.3)
Chloride: 104 mmol/L (ref 98–111)
Creatinine, Ser: 1.02 mg/dL (ref 0.61–1.24)
GFR, Estimated: >60 mL/min (ref >60)
Glucose, Bld: 162 mg/dL — ABNORMAL HIGH (ref 70–99)
Potassium: 3.4 mmol/L — ABNORMAL LOW (ref 3.5–5.1)
Sodium: 138 mmol/L (ref 135–145)

## 2021-07-23 LAB — TROPONIN I (HIGH SENSITIVITY)
Troponin I (High Sensitivity): 6 ng/L (ref <18)
Troponin I (High Sensitivity): 8 ng/L (ref <18)

## 2021-07-23 NOTE — Discharge Instructions (Signed)
You were evaluated in the Emergency Department and after careful evaluation, we did not find any emergent condition requiring admission or further testing in the hospital.  Your exam/testing today was overall reassuring.  CT scan did not show any abnormalities.  Recommend follow-up with your primary care doctor to discuss your high blood pressure.  Please return to the Emergency Department if you experience any worsening of your condition.  Thank you for allowing [place] to be a part of your care.

## 2021-07-23 NOTE — ED Notes (Addendum)
Provider at bedside and aware of elevated BP.

## 2021-07-23 NOTE — ED Notes (Signed)
Ambulating well, getting dressed, spouse at bedside

## 2021-07-23 NOTE — ED Notes (Signed)
This RN at bedside to insert IV. Patient states "What type of IV is this?" This RN explained that the IV would be used during the CT scan to administer contrast. Patient refused stating "I don't put dyes in my body"  Provider made aware.

## 2021-07-23 NOTE — ED Provider Notes (Signed)
[facility] Emergency Department Provider Note MRN:  [id number]  Arrival date & time: 07/23/21     Chief Complaint   Hypertension History of Present Illness   Ethan Pierce is a 75 y.o. year-old male with a history of hypertension, diabetes presenting to the ED with chief complaint of hypertension.  Patient explains that about a week ago he was diagnosed with a sinus infection and was prescribed antibiotics and steroids.  He thinks that the steroids have elevated his blood pressure.  Has been high in the 180s at home.  Here for evaluation.  Denies any chest pain, no shortness of breath, no vision changes, no headache, no complaints or symptoms.  Simply worried about the blood pressure.  Review of Systems  A complete 10 system review of systems was obtained and all systems are negative except as noted in the HPI and PMH.   Patient's Health History    Past Medical History:  Diagnosis Date   Bronchitis    Diabetes mellitus without complication (HCC)    GERD (gastroesophageal reflux disease)    History of 2019 novel coronavirus disease (COVID-19) 05/2019   Hypertension     Past Surgical History:  Procedure Laterality Date   CATARACT EXTRACTION, BILATERAL  2018, 2019   WISDOM TOOTH EXTRACTION  05/2019    Family History  Problem Relation Age of Onset   Diabetes Mother    GER disease Mother    Cancer Father        pancreas   Diabetes Sister    Diabetes Brother     Social History   Socioeconomic History   Marital status: Married    Spouse name: Not on file   Number of children: 2   Years of education: Not on file   Highest education level: Not on file  Occupational History   Not on file  Tobacco Use   Smoking status: Never   Smokeless tobacco: Never  Vaping Use   Vaping Use: Never used  Substance and Sexual Activity   Alcohol use: No   Drug use: No   Sexual activity: Yes  Other Topics Concern   Not on file  Social History Narrative    Not on file   Social Determinants of Health   Financial Resource Strain: Not on file  Food Insecurity: Not on file  Transportation Needs: Not on file  Physical Activity: Not on file  Stress: Not on file  Social Connections: Not on file  Intimate Partner Violence: Not on file     Physical Exam   Vitals:   07/23/21 0535 07/23/21 0600  BP: (!) 199/84 (!) 180/91  Pulse: 85 73  Resp: 18 13  Temp:    SpO2: 98% 98%    CONSTITUTIONAL: Well-appearing, NAD NEURO:  Alert and oriented x 3, no focal deficits EYES:  eyes equal and reactive ENT/NECK:  no LAD, no JVD CARDIO: Regular rate, well-perfused, normal S1 and S2 PULM:  CTAB no wheezing or rhonchi GI/GU:  normal bowel sounds, non-distended, non-tender MSK/SPINE:  No gross deformities, no edema SKIN:  no rash, atraumatic PSYCH:  Appropriate speech and behavior  *Additional and/or pertinent findings included in MDM below  Diagnostic and Interventional Summary    EKG Interpretation  Date/Time:  Tuesday July 22 2021 23:27:21 EST Ventricular Rate:  79 PR Interval:  188 QRS Duration: 88 QT Interval:  382 QTC Calculation: 438 R Axis:   82 Text Interpretation: Normal sinus rhythm Normal ECG Confirmed by Kennis Carina [phone])  on 07/23/2021 6:12:47 AM       Labs Reviewed  BASIC METABOLIC PANEL - Abnormal; Notable for the following components:      Result Value   Potassium 3.4 (*)    Glucose, Bld 162 (*)    All other components within normal limits  CBC - Abnormal; Notable for the following components:   WBC 11.9 (*)    All other components within normal limits  TROPONIN I (HIGH SENSITIVITY)  TROPONIN I (HIGH SENSITIVITY)    DG Chest 2 View  Final Result    CT Chest Wo Contrast    (Results Pending)    Medications  nitroGLYCERIN (NITROSTAT) SL tablet 0.4 mg (0.4 mg Sublingual Given 07/22/21 2338)     Procedures  /  Critical Care Procedures  ED Course and Medical Decision Making  I have reviewed the triage  vital signs, the nursing notes, and pertinent available records from the EMR.  Listed above are laboratory and imaging tests that I personally ordered, reviewed, and interpreted and then considered in my medical decision making (see below for details).  Asymptomatic hypertension, labs done in triage are very reassuring, troponin negative x2.  There is documentation of chest pain today at some point but patient denies this.  EKG is reassuring, only remaining question is the density seen on chest x-ray, will follow-up with CT.  Patient is declining IV contrast, says it has caused him trouble in the past.  Anticipating discharge after CT.       Elmer Sow. Pilar Plate, MD [facility] Emergency Medicine [facility] [email or web address]  Final Clinical Impressions(s) / ED Diagnoses  No diagnosis found.  ED Discharge Orders     None        Discharge Instructions Discussed with and Provided to Patient:   Discharge Instructions   None       Sabas Sous, MD 07/23/21 [phone]

## 2021-07-24 DIAGNOSIS — I10 Essential (primary) hypertension: Secondary | ICD-10-CM | POA: Diagnosis not present

## 2021-07-29 DIAGNOSIS — I10 Essential (primary) hypertension: Secondary | ICD-10-CM | POA: Diagnosis not present

## 2021-07-30 ENCOUNTER — Other Ambulatory Visit (HOSPITAL_COMMUNITY): Payer: Self-pay

## 2021-08-05 ENCOUNTER — Other Ambulatory Visit (HOSPITAL_COMMUNITY): Payer: Self-pay

## 2021-08-05 DIAGNOSIS — I10 Essential (primary) hypertension: Secondary | ICD-10-CM | POA: Diagnosis not present

## 2021-08-05 MED ORDER — VALSARTAN-HYDROCHLOROTHIAZIDE 160-12.5 MG PO TABS
ORAL_TABLET | ORAL | 3 refills | Status: DC
  Filled 2021-08-05: qty 90, 90d supply, fill #0
  Filled 2021-09-23: qty 90, 90d supply, fill #1
  Filled 2021-11-06: qty 90, 90d supply, fill #2
  Filled 2021-12-19: qty 90, 90d supply, fill #3

## 2021-08-11 DIAGNOSIS — H524 Presbyopia: Secondary | ICD-10-CM | POA: Diagnosis not present

## 2021-08-11 DIAGNOSIS — H52223 Regular astigmatism, bilateral: Secondary | ICD-10-CM | POA: Diagnosis not present

## 2021-08-18 ENCOUNTER — Other Ambulatory Visit (HOSPITAL_COMMUNITY): Payer: Self-pay

## 2021-08-18 MED ORDER — AMLODIPINE BESYLATE 10 MG PO TABS
ORAL_TABLET | ORAL | 3 refills | Status: DC
  Filled 2021-08-18: qty 90, 90d supply, fill #0
  Filled 2021-11-06 – 2022-02-06 (×2): qty 90, 90d supply, fill #1
  Filled 2022-05-01: qty 90, 90d supply, fill #2
  Filled 2022-07-24: qty 90, 90d supply, fill #3

## 2021-09-11 ENCOUNTER — Ambulatory Visit: Payer: No Typology Code available for payment source | Admitting: Podiatrist

## 2021-09-19 ENCOUNTER — Other Ambulatory Visit: Payer: Self-pay

## 2021-09-19 ENCOUNTER — Ambulatory Visit (INDEPENDENT_AMBULATORY_CARE_PROVIDER_SITE_OTHER): Payer: No Typology Code available for payment source | Admitting: Podiatry

## 2021-09-19 DIAGNOSIS — Z460 Encounter for fitting and adjustment of spectacles and contact lenses: Secondary | ICD-10-CM

## 2021-09-19 DIAGNOSIS — M21622 Bunionette of left foot: Secondary | ICD-10-CM

## 2021-09-19 DIAGNOSIS — M2141 Flat foot [pes planus] (acquired), right foot: Secondary | ICD-10-CM

## 2021-09-19 DIAGNOSIS — G629 Polyneuropathy, unspecified: Secondary | ICD-10-CM | POA: Insufficient documentation

## 2021-09-19 DIAGNOSIS — G4730 Sleep apnea, unspecified: Secondary | ICD-10-CM | POA: Insufficient documentation

## 2021-09-19 DIAGNOSIS — E1169 Type 2 diabetes mellitus with other specified complication: Secondary | ICD-10-CM

## 2021-09-19 DIAGNOSIS — M79675 Pain in left toe(s): Secondary | ICD-10-CM | POA: Diagnosis not present

## 2021-09-19 DIAGNOSIS — M79674 Pain in right toe(s): Secondary | ICD-10-CM | POA: Diagnosis not present

## 2021-09-19 DIAGNOSIS — U099 Post covid-19 condition, unspecified: Secondary | ICD-10-CM

## 2021-09-19 DIAGNOSIS — L601 Onycholysis: Secondary | ICD-10-CM

## 2021-09-19 DIAGNOSIS — B351 Tinea unguium: Secondary | ICD-10-CM | POA: Diagnosis not present

## 2021-09-19 DIAGNOSIS — M2142 Flat foot [pes planus] (acquired), left foot: Secondary | ICD-10-CM

## 2021-09-19 DIAGNOSIS — G9332 Myalgic encephalomyelitis/chronic fatigue syndrome: Secondary | ICD-10-CM | POA: Insufficient documentation

## 2021-09-19 DIAGNOSIS — M722 Plantar fascial fibromatosis: Secondary | ICD-10-CM | POA: Insufficient documentation

## 2021-09-19 DIAGNOSIS — G63 Polyneuropathy in diseases classified elsewhere: Secondary | ICD-10-CM | POA: Diagnosis not present

## 2021-09-19 DIAGNOSIS — L84 Corns and callosities: Secondary | ICD-10-CM

## 2021-09-19 DIAGNOSIS — Z8616 Personal history of COVID-19: Secondary | ICD-10-CM | POA: Insufficient documentation

## 2021-09-19 DIAGNOSIS — H269 Unspecified cataract: Secondary | ICD-10-CM | POA: Insufficient documentation

## 2021-09-19 DIAGNOSIS — R269 Unspecified abnormalities of gait and mobility: Secondary | ICD-10-CM | POA: Insufficient documentation

## 2021-09-19 DIAGNOSIS — E119 Type 2 diabetes mellitus without complications: Secondary | ICD-10-CM

## 2021-09-19 HISTORY — DX: Bunionette of left foot: M21.622

## 2021-09-19 HISTORY — DX: Post covid-19 condition, unspecified: U09.9

## 2021-09-19 HISTORY — DX: Onycholysis: L60.1

## 2021-09-19 HISTORY — DX: Unspecified cataract: H26.9

## 2021-09-19 HISTORY — DX: Sleep apnea, unspecified: G47.30

## 2021-09-19 HISTORY — DX: Encounter for fitting and adjustment of spectacles and contact lenses: Z46.0

## 2021-09-23 ENCOUNTER — Other Ambulatory Visit (HOSPITAL_COMMUNITY): Payer: Self-pay

## 2021-09-25 ENCOUNTER — Other Ambulatory Visit: Payer: Self-pay

## 2021-09-25 ENCOUNTER — Ambulatory Visit: Payer: No Typology Code available for payment source

## 2021-09-25 ENCOUNTER — Telehealth: Payer: Self-pay

## 2021-09-25 DIAGNOSIS — M21622 Bunionette of left foot: Secondary | ICD-10-CM

## 2021-09-25 DIAGNOSIS — E1169 Type 2 diabetes mellitus with other specified complication: Secondary | ICD-10-CM

## 2021-09-25 NOTE — Telephone Encounter (Signed)
Casts sent to central fab - HOLD FOR CMN °

## 2021-09-25 NOTE — Progress Notes (Signed)
SITUATION Reason for Consult: Evaluation for Prefabricated Diabetic Shoes and Custom Diabetic Inserts. Patient / Caregiver Report: Patient would like well fitting shoes  OBJECTIVE DATA: Patient History / Diagnosis:    ICD-10-CM   1. Type 2 diabetes mellitus with other specified complication, unspecified whether long term insulin use (HCC)  E11.69     2. Bunionette of left foot  M21.622       Current or Previous Devices:   Historical shoe user  In-Person Foot Examination: Ulcers & Callousing:   None and no history  Deformities:     Shoe Size: 9W  ORTHOTIC RECOMMENDATION Recommended Devices: - 1x pair prefabricated PDAC approved diabetic shoes; Patient Selected - Apex Airya Boot [phone] and 4000M, patient wished to purchase a second pair outright and understands self-pay cost Size 9W - 3x pair custom-to-patient PDAC approved vacuum formed diabetic insoles.  GOALS OF SHOES AND INSOLES - Reduce shear and pressure - Reduce / Prevent callus formation - Reduce / Prevent ulceration - Protect the fragile healing compromised diabetic foot.  Patient would benefit from diabetic shoes and inserts as patient has diabetes mellitus and the patient has one or more of the following conditions: - History of partial or complete amputation of the foot - History of previous foot ulceration. - History of pre-ulcerative callus - Peripheral neuropathy with evidence of callus formation - Foot deformity - Poor circulation  ACTIONS PERFORMED Patient was casted for insoles via crush box and measured for shoes via brannock device. Procedure was explained and patient tolerated procedure well. All questions were answered and concerns addressed.  PLAN Patient is to be contacted and scheduled for fitting once CMN is obtained from treaing physician and shoes and insoles have been fabricated and received.

## 2021-09-27 ENCOUNTER — Encounter: Payer: Self-pay | Admitting: Podiatry

## 2021-09-27 NOTE — Progress Notes (Signed)
ANNUAL DIABETIC FOOT EXAM  Subjective: Ethan Pierce presents today for for annual diabetic foot examination, at risk foot care with history of diabetic neuropathy, and painful elongated mycotic toenails 1-5 bilaterally which are tender when wearing enclosed shoe gear. Pain is relieved with periodic professional debridement..  Patient denies any h/o foot wounds.  Patient denies any numbness, tingling, burning, or pins/needle sensation in feet.  Patient does not monitor blood glucose daily.  Risk factors: diabetes, neuropathy, HTN.  Ethan Reichmann, DO is patient's PCP. Last visit was September 02, 2021.  Past Medical History:  Diagnosis Date   Bronchitis    Diabetes mellitus without complication (HCC)    GERD (gastroesophageal reflux disease)    History of 2019 novel coronavirus disease (COVID-19) 05/2019   Hypertension    Patient Active Problem List   Diagnosis Date Noted   History of 2019 novel coronavirus disease (COVID-19) 09/19/2021   Onycholysis 09/19/2021   Peripheral neuropathy 09/19/2021   Plantar fascial fibromatosis 09/19/2021   Sleep apnea 09/19/2021   Abnormal gait 09/19/2021   Cataract 09/19/2021   Bunionette of left foot 09/19/2021   Chronic fatigue syndrome 09/19/2021   Chronic post-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 09/19/2021   Encounter for fitting and adjustment of spectacles and contact lenses 09/19/2021   Sinusitis, acute maxillary 01/08/2021   Diabetes mellitus type 2, uncontrolled, with complications 06/05/2019   Essential hypertension 06/05/2019   Pneumonia due to COVID-19 virus 06/03/2019   Type 2 diabetes mellitus (HCC) 06/03/2019   Fall 06/03/2019   Lumbar radiculopathy 03/03/2018   Hypertension associated with diabetes (HCC) 08/08/2012   GERD (gastroesophageal reflux disease) 08/08/2012   Past Surgical History:  Procedure Laterality Date   CATARACT EXTRACTION, BILATERAL  2018, 2019   WISDOM TOOTH EXTRACTION  05/2019   Current Outpatient Medications on File  Prior to Visit  Medication Sig Dispense Refill   acetaminophen (TYLENOL) 160 MG/5ML suspension 1 TEASPOONFUL BY MOUTH 3 TIMES A DAY AS NEEDED FOR PAIN, REPLACES ACETAMINOPHEN TABLETS     atorvastatin (LIPITOR) 20 MG tablet TAKE ONE-HALF TABLET BY MOUTH AT BEDTIME FOR CHOLESTEROL     butalbital-acetaminophen-caffeine (FIORICET) 50-325-40 MG tablet TAKE 1 TABLET BY MOUTH FOUR TIMES A DAY AS NEEDED FOR MIGRAINE.MAXIMUM DOSE IS 6 TABLETS PER DAY.THESE TABLETS CONTAIN 325MG  ACETAMINOPHEN (TYLENOL, APAP). MAXIMUM SAFE DOSE OF ACETAMINOPHEN IS 3000MG  PER DAY.     calcipotriene (DOVONOX) 0.005 % cream APPLY SMALL AMOUNT TOPICALLY 3 TIMES A DAY AS NEEDED FOR FEET     Calcipotriene 0.005 % solution APPLY SMALL AMOUNT TOPICALLY THREE TIMES A DAY     cyanocobalamin (,VITAMIN B-12,) 1000 MCG/ML injection 1 ml     dapagliflozin propanediol (FARXIGA) 5 MG TABS tablet 1 tablet     diclofenac Sodium (VOLTAREN) 1 % GEL APPLY 4 GRAMS TOPICALLY FOUR TIMES A DAY AS NEEDED FOR HIP PAIN     fluticasone (FLONASE) 50 MCG/ACT nasal spray 1 spray in each nostril     glimepiride (AMARYL) 4 MG tablet 1 tablet with breakfast or the first main meal of the day     guaiFENesin (ROBITUSSIN) 100 MG/5ML liquid TAKE 2 TEASPOONSFUL BY MOUTH EVERY 4 HOURS AS NEEDED FOR COUGH     L-Methylfolate-Algae-B12-B6 (METANX) 3-90.314-2-35 MG CAPS 1 capsule     loratadine (CLARITIN) 10 MG tablet TAKE ONE TABLET BY MOUTH EVERY DAY FOR ALLERGIES AND NASAL CONGESTION     PARoxetine (PAXIL) 20 MG tablet TAKE ONE TABLET BY MOUTH AT BEDTIME FOR 90 DAYS FOR DEPRESSION AND ANXIETY  pioglitazone (ACTOS) 15 MG tablet 1 tablet     Pitavastatin Calcium (LIVALO) 4 MG TABS 1 tablet     sitaGLIPtin (JANUVIA) 50 MG tablet 1 tablet     sodium chloride (OCEAN) 0.65 % nasal spray SPRAY 2 SPRAYS INTO EACH NOSTRIL TWO TIMES A DAY     venlafaxine XR (EFFEXOR-XR) 75 MG 24 hr capsule 1 capsule with food     acetaminophen (TYLENOL) 160 MG/5ML elixir Take 15.6 mLs  (500 mg total) by mouth every 4 (four) hours as needed for fever. 473 mL 2   albuterol (VENTOLIN HFA) 108 (90 Base) MCG/ACT inhaler INHALE 2 PUFFS BY MOUTH INTO THE LUNGS EVERY 6 HOURS AS NEEDED FOR WHEEZING OR SHORTNESS OF BREATH 8.5 g 6   amLODipine (NORVASC) 10 MG tablet Take 1 tablet by mouth once a day in the morning for blood pressure. 90 tablet 3   azithromycin (ZITHROMAX) 100 MG/5ML suspension Take 25 mls by mouth on day 1 then 12.5 mls on days 2-5 75 mL 0   bimatoprost (LUMIGAN) 0.01 % SOLN 1 drop into affected eye in the evening     bimatoprost (LUMIGAN) 0.03 % ophthalmic solution Place 1 drop into both eyes at bedtime.     Butalbital-APAP-Caffeine 50-325-40 MG capsule 1 capsule as needed     citalopram (CELEXA) 40 MG tablet 0.5 tablet     diphenhydrAMINE-Acetaminophen (TYLENOL COLD RELIEF PO) Take by mouth as needed.     fluticasone (VERAMYST) 27.5 MCG/SPRAY nasal spray Place 2 sprays into the nose daily.     ipratropium (ATROVENT) 0.03 % nasal spray PLACE 2 SPRAYS INTO BOTH NOSTRILS EVERY 12 HOURS 30 mL 12   lactulose (CEPHULAC) 10 g packet 1 packet     loratadine (SB LORATADINE) 5 MG/5ML syrup Take 10 mLs (10 mg total) by mouth daily as needed for allergies or rhinitis. 180 mL 3   losartan (COZAAR) 25 MG tablet Take 25 mg by mouth.     meclizine (ANTIVERT) 25 MG tablet 1 tablet as needed     Melatonin 3 MG TABS Take 3 mg by mouth at bedtime.     metoprolol tartrate (LOPRESSOR) 25 MG tablet Take 1 tablet (25 mg total) by mouth 2 (two) times daily. 60 tablet 3   Multiple Vitamins-Minerals (CENTRUM SILVER 50+MEN) TABS Take 2 tablets by mouth daily.     oxybutynin (DITROPAN) 5 MG tablet 1 tablet     sildenafil (VIAGRA) 100 MG tablet 1 tablet as needed     terbinafine (LAMISIL) 1 % cream 1 application to affected area     traZODone (DESYREL) 100 MG tablet 1 tablet at bedtime.     urea (UREA 20 INTENSIVE HYDRATING) 20 % cream 1 application to affected area as needed      valsartan-hydrochlorothiazide (DIOVAN-HCT) 160-12.5 MG tablet Take 1 tablet by mouth daily for blood pressure 90 tablet 3   No current facility-administered medications on file prior to visit.    Allergies  Allergen Reactions   Penicillin G Anaphylaxis   Penicillins Anaphylaxis    Did it involve swelling of the face/tongue/throat, SOB, or low BP? Yes Did it involve sudden or severe rash/hives, skin peeling, or any reaction on the inside of your mouth or nose? No Did you need to seek medical attention at a hospital or doctor's office? No When did it last happen?       If all above answers are NO, may proceed with cephalosporin use.    Trazodone And Nefazodone Other (See  Comments)    Confusion, agitation, and hyperalert   Ativan [Lorazepam] Other (See Comments)    agitation    Atorvastatin     Other reaction(s): myalgia   Decongestant [Pseudoephedrine Hcl Er] Other (See Comments)    Elevated blood pressure    Flunisolide    Lactose    Lisinopril     Other reaction(s): cough   Talwin [Pentazocine] Other (See Comments)    Elevated blood pressure.   Social History   Occupational History   Not on file  Tobacco Use   Smoking status: Never   Smokeless tobacco: Never  Vaping Use   Vaping Use: Never used  Substance and Sexual Activity   Alcohol use: No   Drug use: No   Sexual activity: Yes   Family History  Problem Relation Age of Onset   Diabetes Mother    GER disease Mother    Cancer Father        pancreas   Diabetes Sister    Diabetes Brother    Immunization History  Administered Date(s) Administered   Fluad Quad(high Dose 65+) 07/13/2019, 05/07/2021   H1N1 06/12/2008   Influenza-Unspecified 05/03/2005, 06/16/2006, 07/12/2007, 04/16/2008, 05/03/2008   Janssen (J&J) SARS-COV-2 Vaccination 11/01/2019   Pneumococcal Polysaccharide-23 08/04/2003   Pneumococcal-Unspecified 05/03/2004   Tdap 08/03/2010     Review of Systems: Negative except as noted in the HPI.    Objective: There were no vitals filed for this visit.  Ramiro Harvesthomas L Inocencio is a pleasant 76 y.o. male in NAD. AAO X 3.  Vascular Examination: CFT <3 seconds b/l LE. Faintly palpable pedal pulses b/l LE. Pedal hair present b/l LE. Skin temperature gradient WNL b/l. No pain with calf compression b/l. No edema b/l LE. No cyanosis or clubbing noted b/l LE.  Dermatological Examination: Pedal integument with normal turgor, texture and tone b/l LE. No open wounds b/l. No interdigital macerations b/l. Toenails 1-5 b/l elongated, thickened, discolored with subungual debris. +Tenderness with dorsal palpation of nailplates. Hyperkeratotic lesion(s) noted submet head 5 left foot.  Musculoskeletal Examination: Normal muscle strength 5/5 to all lower extremity muscle groups bilaterally. Hammertoe(s) noted to the bilateral 5th toes and R 4th toe. Pes planus deformity noted bilateral LE.Marland Kitchen. No pain, crepitus or joint limitation noted with ROM b/l LE.  Patient ambulates independently without assistive aids.  Footwear Assessment: Does the patient wear appropriate shoes? Yes. Does the patient need inserts/orthotics? Yes.  Neurological Examination: Protective sensation intact 5/5 intact bilaterally with 10g monofilament b/l. Vibratory sensation diminished b/l.  Assessment: 1. Pain due to onychomycosis of toenails of both feet   2. Callus   3. Pes planus of both feet   4. Type 2 diabetes mellitus with other specified complication, unspecified whether long term insulin use (HCC)   5. Encounter for diabetic foot exam (HCC)   6. Polyneuropathy associated with underlying disease (HCC)     ADA Risk Categorization: Low Risk :  Patient has all of the following: Intact protective sensation No prior foot ulcer  No severe deformity Pedal pulses present  Plan: -Diabetic foot examination performed today. -Continue foot and shoe inspections daily. Monitor blood glucose per PCP/Endocrinologist's  recommendations. -Patient to continue soft, supportive shoe gear daily. Start procedure for diabetic shoes. Patient qualifies based on diagnoses. -Patient to schedule appointment with Pedorthist for diabetic shoe measurements. -Mycotic toenails 1-5 bilaterally were debrided in length and girth with sterile nail nippers and dremel without incident. -Callus(es) submet head 5 left foot pared utilizing sterile scalpel blade without  complication or incident. Total number debrided =1. -Patient/POA to call should there be question/concern in the interim.  Return in about 3 months (around 12/17/2021).  Freddie Breech, DPM

## 2021-11-06 ENCOUNTER — Other Ambulatory Visit (HOSPITAL_COMMUNITY): Payer: Self-pay

## 2021-11-27 DIAGNOSIS — J310 Chronic rhinitis: Secondary | ICD-10-CM | POA: Diagnosis not present

## 2021-11-27 DIAGNOSIS — H9191 Unspecified hearing loss, right ear: Secondary | ICD-10-CM | POA: Diagnosis not present

## 2021-11-27 DIAGNOSIS — H8101 Meniere's disease, right ear: Secondary | ICD-10-CM | POA: Insufficient documentation

## 2021-12-01 ENCOUNTER — Other Ambulatory Visit (HOSPITAL_COMMUNITY): Payer: Self-pay

## 2021-12-01 ENCOUNTER — Other Ambulatory Visit: Payer: Self-pay | Admitting: Pulmonary Disease

## 2021-12-02 ENCOUNTER — Other Ambulatory Visit (HOSPITAL_COMMUNITY): Payer: Self-pay

## 2021-12-02 MED ORDER — ALBUTEROL SULFATE HFA 108 (90 BASE) MCG/ACT IN AERS
2.0000 | INHALATION_SPRAY | Freq: Four times a day (QID) | RESPIRATORY_TRACT | 2 refills | Status: DC | PRN
  Filled 2021-12-02: qty 8.5, 25d supply, fill #0
  Filled 2022-01-14: qty 8.5, 25d supply, fill #1

## 2021-12-18 ENCOUNTER — Other Ambulatory Visit (HOSPITAL_COMMUNITY): Payer: Self-pay

## 2021-12-18 MED ORDER — ALBUTEROL SULFATE HFA 108 (90 BASE) MCG/ACT IN AERS
INHALATION_SPRAY | RESPIRATORY_TRACT | 11 refills | Status: DC
  Filled 2021-12-18 – 2022-01-31 (×2): qty 8.5, 25d supply, fill #0
  Filled 2022-08-07: qty 6.7, 25d supply, fill #1

## 2021-12-18 MED ORDER — PREDNISONE 10 MG PO TABS
ORAL_TABLET | ORAL | 0 refills | Status: DC
  Filled 2021-12-18: qty 19, 10d supply, fill #0

## 2021-12-18 MED ORDER — BENZONATATE 200 MG PO CAPS
ORAL_CAPSULE | ORAL | 0 refills | Status: DC
  Filled 2021-12-18: qty 30, 10d supply, fill #0

## 2021-12-18 MED ORDER — AZITHROMYCIN 250 MG PO TABS
ORAL_TABLET | ORAL | 0 refills | Status: DC
  Filled 2021-12-18: qty 14, 7d supply, fill #0

## 2021-12-19 ENCOUNTER — Ambulatory Visit (INDEPENDENT_AMBULATORY_CARE_PROVIDER_SITE_OTHER): Payer: No Typology Code available for payment source | Admitting: Podiatry

## 2021-12-19 ENCOUNTER — Encounter: Payer: Self-pay | Admitting: Podiatry

## 2021-12-19 ENCOUNTER — Other Ambulatory Visit (HOSPITAL_COMMUNITY): Payer: Self-pay

## 2021-12-19 DIAGNOSIS — M79675 Pain in left toe(s): Secondary | ICD-10-CM | POA: Diagnosis not present

## 2021-12-19 DIAGNOSIS — U071 COVID-19: Secondary | ICD-10-CM | POA: Insufficient documentation

## 2021-12-19 DIAGNOSIS — F419 Anxiety disorder, unspecified: Secondary | ICD-10-CM | POA: Insufficient documentation

## 2021-12-19 DIAGNOSIS — H548 Legal blindness, as defined in USA: Secondary | ICD-10-CM

## 2021-12-19 DIAGNOSIS — M545 Low back pain, unspecified: Secondary | ICD-10-CM | POA: Insufficient documentation

## 2021-12-19 DIAGNOSIS — B351 Tinea unguium: Secondary | ICD-10-CM | POA: Diagnosis not present

## 2021-12-19 DIAGNOSIS — E785 Hyperlipidemia, unspecified: Secondary | ICD-10-CM | POA: Insufficient documentation

## 2021-12-19 DIAGNOSIS — E1142 Type 2 diabetes mellitus with diabetic polyneuropathy: Secondary | ICD-10-CM

## 2021-12-19 DIAGNOSIS — F331 Major depressive disorder, recurrent, moderate: Secondary | ICD-10-CM | POA: Insufficient documentation

## 2021-12-19 DIAGNOSIS — M79674 Pain in right toe(s): Secondary | ICD-10-CM

## 2021-12-19 DIAGNOSIS — L84 Corns and callosities: Secondary | ICD-10-CM

## 2021-12-19 DIAGNOSIS — J309 Allergic rhinitis, unspecified: Secondary | ICD-10-CM | POA: Insufficient documentation

## 2021-12-19 DIAGNOSIS — H526 Other disorders of refraction: Secondary | ICD-10-CM | POA: Insufficient documentation

## 2021-12-19 DIAGNOSIS — N529 Male erectile dysfunction, unspecified: Secondary | ICD-10-CM | POA: Insufficient documentation

## 2021-12-19 DIAGNOSIS — H8103 Meniere's disease, bilateral: Secondary | ICD-10-CM | POA: Insufficient documentation

## 2021-12-19 DIAGNOSIS — D649 Anemia, unspecified: Secondary | ICD-10-CM | POA: Insufficient documentation

## 2021-12-19 DIAGNOSIS — M7750 Other enthesopathy of unspecified foot: Secondary | ICD-10-CM | POA: Insufficient documentation

## 2021-12-19 HISTORY — DX: COVID-19: U07.1

## 2021-12-19 HISTORY — DX: Other disorders of refraction: H52.6

## 2021-12-19 HISTORY — DX: Legal blindness, as defined in USA: H54.8

## 2021-12-22 ENCOUNTER — Other Ambulatory Visit (HOSPITAL_COMMUNITY): Payer: Self-pay

## 2021-12-27 NOTE — Progress Notes (Signed)
Subjective:  Patient ID: Ethan Pierce, male    DOB: Jan 17, 1946,  MRN: [id number]  IZAAK SAHR presents to clinic today for at risk foot care with history of diabetic neuropathy and callus(es) left lower extremity and painful thick toenails that are difficult to trim. Painful toenails interfere with ambulation. Aggravating factors include wearing enclosed shoe gear. Pain is relieved with periodic professional debridement. Painful calluses are aggravated when weightbearing with and without shoegear. Pain is relieved with periodic professional debridement.  Last known HgA1c was 6.3%. Patient does not monitor blood glucose daily.  New problem(s): None.   PCP is Irena Reichmann, DO , and last visit was September 01, 2021.  Allergies  Allergen Reactions   Penicillin G Anaphylaxis   Penicillins Anaphylaxis    Did it involve swelling of the face/tongue/throat, SOB, or low BP? Yes Did it involve sudden or severe rash/hives, skin peeling, or any reaction on the inside of your mouth or nose? No Did you need to seek medical attention at a hospital or doctor's office? No When did it last happen?       If all above answers are "NO", may proceed with cephalosporin use.    Trazodone And Nefazodone Other (See Comments)    Confusion, agitation, and hyperalert   Ativan [Lorazepam] Other (See Comments)    agitation    Atorvastatin     Other reaction(s): myalgia   Decongestant [Pseudoephedrine Hcl Er] Other (See Comments)    Elevated blood pressure    Flunisolide    Lactose    Lisinopril     Other reaction(s): cough   Talwin [Pentazocine] Other (See Comments)    Elevated blood pressure.    Review of Systems: Negative except as noted in the HPI.  Objective: No changes noted in today's physical examination. There were no vitals filed for this visit.  OSEAS DETTY is a pleasant 76 y.o. male in NAD. AAO X 3.  Vascular Examination: CFT <3 seconds b/l LE. Faintly palpable pedal pulses b/l  LE. Pedal hair present b/l LE. Skin temperature gradient WNL b/l. No pain with calf compression b/l. No edema b/l LE. No cyanosis or clubbing noted b/l LE.  Dermatological Examination: Pedal integument with normal turgor, texture and tone b/l LE. No open wounds b/l. No interdigital macerations b/l. Toenails 1-5 b/l elongated, thickened, discolored with subungual debris. +Tenderness with dorsal palpation of nailplates. Hyperkeratotic lesion(s) noted submet head 5 left foot.  Musculoskeletal Examination: Normal muscle strength 5/5 to all lower extremity muscle groups bilaterally. Hammertoe(s) noted to the bilateral 5th toes and R 4th toe. Pes planus deformity noted bilateral LE.Marland Kitchen No pain, crepitus or joint limitation noted with ROM b/l LE.  Patient ambulates independently without assistive aids.  Neurological Examination: Protective sensation intact 5/5 intact bilaterally with 10g monofilament b/l. Vibratory sensation diminished b/l.  Assessment/Plan: 1. Pain due to onychomycosis of toenails of both feet   2. Callus   3. Diabetic peripheral neuropathy associated with type 2 diabetes mellitus (HCC)      -Patient was evaluated and treated. All patient's and/or POA's questions/concerns answered on today's visit. -Continue diabetic foot care principles: inspect feet daily, monitor glucose as recommended by PCP and/or Endocrinologist, and follow prescribed diet per PCP, Endocrinologist and/or dietician. -Patient to continue soft, supportive shoe gear daily. -Toenails 1-5 b/l were debrided in length and girth with sterile nail nippers and dremel without iatrogenic bleeding.  -Callus(es) submet head 5 left foot pared utilizing sterile scalpel blade without complication or incident. Total  number debrided =1. -Patient/POA to call should there be question/concern in the interim.   Return in about 3 months (around 03/21/2022).  Freddie Breech, DPM

## 2022-01-01 ENCOUNTER — Other Ambulatory Visit (HOSPITAL_COMMUNITY): Payer: Self-pay

## 2022-01-14 ENCOUNTER — Other Ambulatory Visit (HOSPITAL_COMMUNITY): Payer: Self-pay

## 2022-01-22 ENCOUNTER — Other Ambulatory Visit (HOSPITAL_COMMUNITY): Payer: Self-pay

## 2022-01-22 ENCOUNTER — Telehealth: Payer: Self-pay | Admitting: Pulmonary Disease

## 2022-01-22 MED ORDER — LAGEVRIO 200 MG PO CAPS
ORAL_CAPSULE | ORAL | 0 refills | Status: DC
  Filled 2022-01-22: qty 40, 5d supply, fill #0

## 2022-01-22 NOTE — Telephone Encounter (Signed)
Patient came into the office, I went and talked to him about what is going on. He states that he went by his PCP's office this morning first and they told him to go home and do a covid test and call them with the results. Advised him that with the fever and cough to go home and go do covid test. Advised him if it was positive to call his PCP and our office to let us know of results. If it is negative then to call his PCP and let them know and see what their recs are. Patient and his wife expressed understanding. Nothing further needed at this time.

## 2022-01-22 NOTE — Telephone Encounter (Signed)
Called patient and number when straight to voicemail. Left voicemail for patient to call back in regards to his fever and cough.

## 2022-01-23 ENCOUNTER — Other Ambulatory Visit (HOSPITAL_COMMUNITY): Payer: Self-pay

## 2022-01-27 ENCOUNTER — Ambulatory Visit (HOSPITAL_COMMUNITY)
Admission: EM | Admit: 2022-01-27 | Discharge: 2022-01-27 | Disposition: A | Discharge status: Home / Self Care | Payer: No Typology Code available for payment source | Attending: Student | Admitting: Student

## 2022-01-27 ENCOUNTER — Telehealth: Payer: Self-pay | Admitting: Pulmonary Disease

## 2022-01-27 ENCOUNTER — Encounter (HOSPITAL_COMMUNITY): Payer: Self-pay

## 2022-01-27 DIAGNOSIS — J0100 Acute maxillary sinusitis, unspecified: Secondary | ICD-10-CM

## 2022-01-27 DIAGNOSIS — U071 COVID-19: Secondary | ICD-10-CM

## 2022-01-27 MED ORDER — ALBUTEROL SULFATE (2.5 MG/3ML) 0.083% IN NEBU
2.5000 mg | INHALATION_SOLUTION | Freq: Once | RESPIRATORY_TRACT | Status: AC
  Administered 2022-01-27: 2.5 mg via RESPIRATORY_TRACT

## 2022-01-27 MED ORDER — AZITHROMYCIN 200 MG/5ML PO SUSR: 200.0000 mg | Freq: Every day | ORAL | 0 refills | Status: AC

## 2022-01-27 MED ORDER — ALBUTEROL SULFATE (2.5 MG/3ML) 0.083% IN NEBU
INHALATION_SOLUTION | RESPIRATORY_TRACT | Status: AC
  Filled 2022-01-27: qty 3

## 2022-01-27 NOTE — ED Provider Notes (Signed)
[facility]    CSN: [id number] Arrival date & time: 01/27/22  1745      History   Chief Complaint Chief Complaint  Patient presents with   Cough    HPI Ethan Pierce is a 76 y.o. male presenting with cough x1 week.  History of chronic bronchitis.  Apparently positive for covid on 01/20/22 by home test, antiviral was sent by PCP but patient is not sure which one and I do not have access to these records; he did not take this medication. States the cough continues to be nonproductive, and there is no associated dyspnea.  He is developing some right-sided maxillary sinus tenderness with postnasal drip.  He is using the albuterol inhaler as needed with some relief.  He states that his pulmonologist told him he may need a breathing treatment and so he is hoping for this today.  Denies current shortness of breath, chest pain, dizziness, weakness, fevers.  HPI  Past Medical History:  Diagnosis Date   Bronchitis    Diabetes mellitus without complication (HCC)    GERD (gastroesophageal reflux disease)    History of 2019 novel coronavirus disease (COVID-19) 05/2019   Hypertension     Patient Active Problem List   Diagnosis Date Noted   Allergic rhinitis 12/19/2021   Anemia 12/19/2021   Anxiety 12/19/2021   COVID-19 12/19/2021   Disorder of refraction and accommodation 12/19/2021   Enthesopathy of ankle and tarsus 12/19/2021   Erectile dysfunction 12/19/2021   Hyperlipidemia 12/19/2021   Legal blindness, as defined in [place] 12/19/2021   Low back pain 12/19/2021   Major depressive disorder, recurrent, moderate (HCC) 12/19/2021   Meniere's disease, bilateral 12/19/2021   Hearing loss in right ear 11/27/2021   History of 2019 novel coronavirus disease (COVID-19) 09/19/2021   Onycholysis 09/19/2021   Peripheral neuropathy 09/19/2021   Plantar fascial fibromatosis 09/19/2021   Sleep apnea 09/19/2021   Abnormal gait 09/19/2021   Cataract 09/19/2021    Bunionette of left foot 09/19/2021   Chronic fatigue syndrome 09/19/2021   Chronic post-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 09/19/2021   Encounter for fitting and adjustment of spectacles and contact lenses 09/19/2021   Sinusitis, acute maxillary 01/08/2021   Diabetes mellitus type 2, uncontrolled, with complications 06/05/2019   Essential hypertension 06/05/2019   Pneumonia due to COVID-19 virus 06/03/2019   Type 2 diabetes mellitus (HCC) 06/03/2019   Fall 06/03/2019   Lumbar radiculopathy 03/03/2018   Diverticulosis of colon 09/09/2012   Hypertension associated with diabetes (HCC) 08/08/2012   GERD (gastroesophageal reflux disease) 08/08/2012    Past Surgical History:  Procedure Laterality Date   CATARACT EXTRACTION, BILATERAL  2018, 2019   WISDOM TOOTH EXTRACTION  05/2019       Home Medications    Prior to Admission medications   Medication Sig Start Date End Date Taking? Authorizing Provider  azithromycin (ZITHROMAX) 200 MG/5ML suspension Take 5 mLs (200 mg total) by mouth daily for 5 days. 01/27/22 02/01/22 Yes Rhys Martini, PA-C  acetaminophen (TYLENOL) 160 MG/5ML elixir Take 15.6 mLs (500 mg total) by mouth every 4 (four) hours as needed for fever. 05/29/19   Arby Barrette, MD  acetaminophen (TYLENOL) 160 MG/5ML suspension 1 TEASPOONFUL BY MOUTH 3 TIMES A DAY AS NEEDED FOR PAIN, REPLACES ACETAMINOPHEN TABLETS 09/03/20   [provider]  albuterol (VENTOLIN HFA) 108 (90 Base) MCG/ACT inhaler INHALE 2 PUFFS BY MOUTH INTO THE LUNGS EVERY 6 HOURS AS NEEDED FOR WHEEZING OR SHORTNESS OF BREATH 12/02/21 12/02/22  Martina Sinner, MD  albuterol (VENTOLIN HFA) 108 (90 Base) MCG/ACT inhaler Inhale 1 to 2 puffs into the lungs every 6 hours. 12/18/21     amLODipine (NORVASC) 10 MG tablet Take 1 tablet by mouth once a day in the morning for blood pressure. 08/18/21     atorvastatin (LIPITOR) 20 MG tablet TAKE ONE-HALF TABLET BY MOUTH AT BEDTIME FOR CHOLESTEROL 09/03/20   [provider]   benzonatate (TESSALON) 200 MG capsule Take 1 capsule by mouth three times per day as needed for cough for up to 10 days. 12/18/21     bimatoprost (LUMIGAN) 0.01 % SOLN 1 drop into affected eye in the evening    [provider]  bimatoprost (LUMIGAN) 0.03 % ophthalmic solution Place 1 drop into both eyes at bedtime.    [provider]  butalbital-acetaminophen-caffeine (FIORICET) 50-325-40 MG tablet TAKE 1 TABLET BY MOUTH FOUR TIMES A DAY AS NEEDED FOR MIGRAINE.MAXIMUM DOSE IS 6 TABLETS PER DAY.THESE TABLETS CONTAIN 325MG  ACETAMINOPHEN (TYLENOL, APAP). MAXIMUM SAFE DOSE OF ACETAMINOPHEN IS 3000MG  PER DAY. 02/13/21   [provider]  Butalbital-APAP-Caffeine 50-325-40 MG capsule 1 capsule as needed    [provider]  calcipotriene (DOVONOX) 0.005 % cream APPLY SMALL AMOUNT TOPICALLY 3 TIMES A DAY AS NEEDED FOR FEET 07/07/21   [provider]  Calcipotriene 0.005 % solution APPLY SMALL AMOUNT TOPICALLY THREE TIMES A DAY 10/07/18   [provider]  citalopram (CELEXA) 40 MG tablet 0.5 tablet    [provider]  cyanocobalamin (,VITAMIN B-12,) 1000 MCG/ML injection 1 ml 09/05/18   [provider]  dapagliflozin propanediol (FARXIGA) 5 MG TABS tablet 1 tablet 05/25/19   [provider]  diclofenac Sodium (VOLTAREN) 1 % GEL APPLY 4 GRAMS TOPICALLY FOUR TIMES A DAY AS NEEDED FOR HIP PAIN 02/13/21   [provider]  diphenhydrAMINE-Acetaminophen (TYLENOL COLD RELIEF PO) Take by mouth as needed.    [provider]  fluticasone (FLONASE) 50 MCG/ACT nasal spray 1 spray in each nostril 09/03/20   [provider]  fluticasone (VERAMYST) 27.5 MCG/SPRAY nasal spray Place 2 sprays into the nose daily.    [provider]  glimepiride (AMARYL) 4 MG tablet 1 tablet with breakfast or the first main meal of the day 07/08/15   [provider]  guaiFENesin (ROBITUSSIN) 100 MG/5ML liquid TAKE 2 TEASPOONSFUL BY  MOUTH EVERY 4 HOURS AS NEEDED FOR COUGH 04/17/21   [provider]  ipratropium (ATROVENT) 0.03 % nasal spray PLACE 2 SPRAYS INTO BOTH NOSTRILS EVERY 12 HOURS 05/13/21 05/13/22  Martina Sinner, MD  L-Methylfolate-Algae-B12-B6 [facility]) 3-90.314-2-35 MG CAPS 1 capsule 05/25/19   [provider]  lactulose (CEPHULAC) 10 g packet 1 packet    [provider]  loratadine (CLARITIN) 10 MG tablet TAKE ONE TABLET BY MOUTH EVERY DAY FOR ALLERGIES AND NASAL CONGESTION 04/17/21   [provider]  loratadine (SB LORATADINE) 5 MG/5ML syrup Take 10 mLs (10 mg total) by mouth daily as needed for allergies or rhinitis. 01/08/21   Glenford Bayley, NP  losartan (COZAAR) 25 MG tablet Take 25 mg by mouth. 04/07/21   [provider]  meclizine (ANTIVERT) 25 MG tablet 1 tablet as needed    [provider]  Melatonin 3 MG TABS Take 3 mg by mouth at bedtime.    [provider]  metoprolol tartrate (LOPRESSOR) 25 MG tablet Take 1 tablet (25 mg total) by mouth 2 (two) times daily. 06/08/19   Feliz  Rosine Beat, MD  molnupiravir EUA (LAGEVRIO) 200 MG CAPS capsule Take 4 capsules by mouth every 12 hrs for 5 days. 01/22/22     Multiple Vitamins-Minerals (CENTRUM SILVER 50+MEN) TABS Take 2 tablets by mouth daily.    [provider]  oxybutynin (DITROPAN) 5 MG tablet 1 tablet    [provider]  PARoxetine (PAXIL) 20 MG tablet TAKE ONE TABLET BY MOUTH AT BEDTIME FOR 90 DAYS FOR DEPRESSION AND ANXIETY 06/04/21   [provider]  pioglitazone (ACTOS) 15 MG tablet 1 tablet 03/23/16   [provider]  Pitavastatin Calcium (LIVALO) 4 MG TABS 1 tablet 06/30/16   [provider]  predniSONE (DELTASONE) 10 MG tablet Take 3 tablets by mouth in the morning for 3 days.  Take 2 tablets in the morning for 3 days.  Then take 1 tablet in the morning for 4 days. 12/18/21     sildenafil (VIAGRA) 100 MG tablet 1 tablet as needed    [provider]  sitaGLIPtin (JANUVIA) 50 MG tablet 1 tablet 02/27/19   [provider]  sodium chloride (OCEAN) 0.65 % nasal spray SPRAY 2 SPRAYS INTO EACH NOSTRIL TWO TIMES A DAY 02/13/21   [provider]  terbinafine (LAMISIL) 1 % cream 1 application to affected area    [provider]  traZODone (DESYREL) 100 MG tablet 1 tablet at bedtime.    [provider]  urea (UREA 20 INTENSIVE HYDRATING) 20 % cream 1 application to affected area as needed    [provider]  valsartan-hydrochlorothiazide (DIOVAN-HCT) 160-12.5 MG tablet Take 1 tablet by mouth daily for blood pressure 08/05/21     venlafaxine XR (EFFEXOR-XR) 75 MG 24 hr capsule 1 capsule with food 10/31/18   [provider]    Family History Family History  Problem Relation Age of Onset   Diabetes Mother    GER disease Mother    Cancer Father        pancreas   Diabetes Sister    Diabetes Brother     Social History Social History   Tobacco Use   Smoking status: Never   Smokeless tobacco: Never  Vaping Use   Vaping Use: Never used  Substance Use Topics   Alcohol use: No   Drug use: No     Allergies   Penicillin g, Penicillins, Trazodone and nefazodone, Ativan [lorazepam], Atorvastatin, Decongestant [pseudoephedrine hcl er], Flunisolide, Lactose, Lisinopril, and Talwin [pentazocine]   Review of Systems Review of Systems  Constitutional:  Negative for appetite change, chills and fever.  HENT:  Positive for congestion and sinus pain. Negative for ear pain, rhinorrhea, sinus pressure and sore throat.   Eyes:  Negative for redness and visual disturbance.  Respiratory:  Positive for cough. Negative for chest tightness, shortness of breath and wheezing.   Cardiovascular:  Negative for chest pain and palpitations.  Gastrointestinal:  Negative for abdominal pain, constipation, diarrhea, nausea and vomiting.  Genitourinary:  Negative for dysuria, frequency and urgency.   Musculoskeletal:  Negative for myalgias.  Neurological:  Negative for dizziness, weakness and headaches.  Psychiatric/Behavioral:  Negative for confusion.   All other systems reviewed and are negative.    Physical Exam Triage Vital Signs ED Triage Vitals  Enc Vitals Group     BP 01/27/22 1757 (!) 158/70     Pulse Rate 01/27/22 1757 90     Resp 01/27/22 1757 14     Temp 01/27/22 1757 98.5 F (36.9 C)  Temp Source 01/27/22 1757 Oral     SpO2 01/27/22 1757 98 %     Weight --      Height --      Head Circumference --      Peak Flow --      Pain Score 01/27/22 1756 0     Pain Loc --      Pain Edu? --      Excl. in GC? --    No data found.  Updated Vital Signs BP (!) 158/70 (BP Location: Left Arm)   Pulse 90   Temp 98.5 F (36.9 C) (Oral)   Resp 14   SpO2 98%   Visual Acuity Right Eye Distance:   Left Eye Distance:   Bilateral Distance:    Right Eye Near:   Left Eye Near:    Bilateral Near:     Physical Exam Vitals reviewed.  Constitutional:      General: He is not in acute distress.    Appearance: Normal appearance. He is not ill-appearing.  HENT:     Head: Normocephalic and atraumatic.     Right Ear: Tympanic membrane, ear canal and external ear normal. No tenderness. No middle ear effusion. There is no impacted cerumen. Tympanic membrane is not perforated, erythematous, retracted or bulging.     Left Ear: Tympanic membrane, ear canal and external ear normal. No tenderness.  No middle ear effusion. There is no impacted cerumen. Tympanic membrane is not perforated, erythematous, retracted or bulging.     Nose: No congestion.     Right Sinus: Maxillary sinus tenderness present. No frontal sinus tenderness.     Left Sinus: No maxillary sinus tenderness or frontal sinus tenderness.     Mouth/Throat:     Mouth: Mucous membranes are moist.     Pharynx: Uvula midline. No oropharyngeal exudate or posterior oropharyngeal erythema.  Eyes:     Extraocular  Movements: Extraocular movements intact.     Pupils: Pupils are equal, round, and reactive to light.  Cardiovascular:     Rate and Rhythm: Normal rate and regular rhythm.     Heart sounds: Normal heart sounds.  Pulmonary:     Effort: Pulmonary effort is normal.     Breath sounds: Normal breath sounds. No decreased breath sounds, wheezing, rhonchi or rales.     Comments: Occ cough  Abdominal:     Palpations: Abdomen is soft.     Tenderness: There is no abdominal tenderness. There is no guarding or rebound.  Lymphadenopathy:     Cervical: No cervical adenopathy.     Right cervical: No superficial cervical adenopathy.    Left cervical: No superficial cervical adenopathy.  Neurological:     General: No focal deficit present.     Mental Status: He is alert and oriented to person, place, and time.  Psychiatric:        Mood and Affect: Mood normal.        Behavior: Behavior normal.        Thought Content: Thought content normal.        Judgment: Judgment normal.      UC Treatments / Results  Labs (all labs ordered are listed, but only abnormal results are displayed) Labs Reviewed - No data to display  EKG   Radiology No results found.  Procedures Procedures (including critical care time)  Medications Ordered in UC Medications  albuterol (PROVENTIL) (2.5 MG/3ML) 0.083% nebulizer solution 2.5 mg (has no administration in time range)  Initial Impression / Assessment and Plan / UC Course  I have reviewed the triage vital signs and the nursing notes.  Pertinent labs & imaging results that were available during my care of the patient were reviewed by me and considered in my medical decision making (see chart for details).     This patient is a very pleasant 76 y.o. year old male presenting with sinusitis following covid-19. Afebrile, nontachy, no adventitious breath sounds, oxygenating comfortably. Antiviral was apparently sent by PCP but he never took this. Requesting  breathing treatment, administered by nurse. He also has albuterol inhaler at home that he can use prn. For sinusitis, he is specifically requesting azithromycin liquid as he cannot tolerate pills and is anaphylactically allergic to penicillins. As I have low concern for pneumonia I am amenable to azithromycin alone today. ED return precautions discussed. Patient verbalizes understanding and agreement.    Final Clinical Impressions(s) / UC Diagnoses   Final diagnoses:  COVID-19  Acute non-recurrent maxillary sinusitis     Discharge Instructions      -Azithromycin x5 days -Continue albuterol inhaler at home      ED Prescriptions     Medication Sig Dispense Auth. Provider   azithromycin (ZITHROMAX) 200 MG/5ML suspension Take 5 mLs (200 mg total) by mouth daily for 5 days. 25 mL Rhys Martini, PA-C      PDMP not reviewed this encounter.   Rhys Martini, PA-C 01/27/22 [phone]

## 2022-01-27 NOTE — Telephone Encounter (Signed)
Called and it went straight to voicemail didn't even ring. Left voicemail for patient to call office back in regards to his cough.

## 2022-01-28 NOTE — Telephone Encounter (Signed)
ATC x2.  No answer, went straight to VM.  LVM to return call.

## 2022-01-31 ENCOUNTER — Other Ambulatory Visit (HOSPITAL_COMMUNITY): Payer: Self-pay

## 2022-02-05 ENCOUNTER — Other Ambulatory Visit (HOSPITAL_COMMUNITY): Payer: Self-pay

## 2022-02-06 ENCOUNTER — Other Ambulatory Visit (HOSPITAL_COMMUNITY): Payer: Self-pay

## 2022-02-06 MED ORDER — VALSARTAN-HYDROCHLOROTHIAZIDE 160-12.5 MG PO TABS
1.0000 | ORAL_TABLET | Freq: Every day | ORAL | 3 refills | Status: DC
  Filled 2022-02-06: qty 90, 90d supply, fill #0
  Filled 2022-05-01: qty 90, 90d supply, fill #1
  Filled 2022-07-24: qty 90, 90d supply, fill #2
  Filled 2022-09-28 – 2022-10-19 (×2): qty 90, 90d supply, fill #3

## 2022-03-27 ENCOUNTER — Ambulatory Visit: Payer: No Typology Code available for payment source | Admitting: Podiatry

## 2022-04-01 ENCOUNTER — Ambulatory Visit: Payer: No Typology Code available for payment source | Admitting: Podiatry

## 2022-04-01 ENCOUNTER — Ambulatory Visit (INDEPENDENT_AMBULATORY_CARE_PROVIDER_SITE_OTHER): Payer: No Typology Code available for payment source | Admitting: Podiatry

## 2022-04-01 ENCOUNTER — Encounter: Payer: Self-pay | Admitting: Podiatry

## 2022-04-01 ENCOUNTER — Other Ambulatory Visit (HOSPITAL_COMMUNITY): Payer: Self-pay

## 2022-04-01 DIAGNOSIS — L853 Xerosis cutis: Secondary | ICD-10-CM | POA: Diagnosis not present

## 2022-04-01 DIAGNOSIS — M79674 Pain in right toe(s): Secondary | ICD-10-CM | POA: Diagnosis not present

## 2022-04-01 DIAGNOSIS — M79675 Pain in left toe(s): Secondary | ICD-10-CM

## 2022-04-01 DIAGNOSIS — E1142 Type 2 diabetes mellitus with diabetic polyneuropathy: Secondary | ICD-10-CM

## 2022-04-01 DIAGNOSIS — B351 Tinea unguium: Secondary | ICD-10-CM | POA: Diagnosis not present

## 2022-04-01 MED ORDER — AMMONIUM LACTATE 12 % EX LOTN
1.0000 | TOPICAL_LOTION | CUTANEOUS | 0 refills | Status: DC | PRN
Start: 2022-04-01 — End: 2022-08-20
  Filled 2022-04-01: qty 226, 30d supply, fill #0
  Filled 2022-05-01: qty 452, 30d supply, fill #0

## 2022-04-01 NOTE — Progress Notes (Signed)
Subjective:  Patient ID: Ethan Pierce, male    DOB: 08-21-1945,  MRN: [id number]  Ethan Pierce presents to clinic today for at risk foot care with history of diabetic neuropathy and callus(es) left lower extremity and painful thick toenails that are difficult to trim. Painful toenails interfere with ambulation. Aggravating factors include wearing enclosed shoe gear. Pain is relieved with periodic professional debridement. Painful calluses are aggravated when weightbearing with and without shoegear. Pain is relieved with periodic professional debridement.  Last known HgA1c was 6.3%. Patient does not monitor blood glucose daily.  New problem(s): None.   PCP is Irena Reichmann, DO , and last visit was September 01, 2021.  Allergies  Allergen Reactions   Penicillin G Anaphylaxis   Penicillins Anaphylaxis    Did it involve swelling of the face/tongue/throat, SOB, or low BP? Yes Did it involve sudden or severe rash/hives, skin peeling, or any reaction on the inside of your mouth or nose? No Did you need to seek medical attention at a hospital or doctor's office? No When did it last happen?       If all above answers are "NO", may proceed with cephalosporin use.    Trazodone And Nefazodone Other (See Comments)    Confusion, agitation, and hyperalert   Ativan [Lorazepam] Other (See Comments)    agitation    Atorvastatin     Other reaction(s): myalgia   Decongestant [Pseudoephedrine Hcl Er] Other (See Comments)    Elevated blood pressure    Flunisolide    Lactose    Lisinopril     Other reaction(s): cough   Talwin [Pentazocine] Other (See Comments)    Elevated blood pressure.    Review of Systems: Negative except as noted in the HPI.  Objective: No changes noted in today's physical examination. There were no vitals filed for this visit.  Ethan Pierce is a pleasant 76 y.o. male in NAD. AAO X 3.  Vascular Examination: CFT <3 seconds b/l LE. Faintly palpable pedal pulses b/l  LE. Pedal hair present b/l LE. Skin temperature gradient WNL b/l. No pain with calf compression b/l. No edema b/l LE. No cyanosis or clubbing noted b/l LE.  Dermatological Examination: Pedal integument with normal turgor, texture and tone b/l LE. No open wounds b/l. No interdigital macerations b/l. Toenails 1-5 b/l elongated, thickened, discolored with subungual debris. +Tenderness with dorsal palpation of nailplates. Hyperkeratotic lesion(s) noted submet head 5 left foot.  Musculoskeletal Examination: Normal muscle strength 5/5 to all lower extremity muscle groups bilaterally. Hammertoe(s) noted to the bilateral 5th toes and R 4th toe. Pes planus deformity noted bilateral LE.Marland Kitchen No pain, crepitus or joint limitation noted with ROM b/l LE.  Patient ambulates independently without assistive aids.  Neurological Examination: Protective sensation intact 5/5 intact bilaterally with 10g monofilament b/l. Vibratory sensation diminished b/l.  Assessment/Plan: No diagnosis found.    -Patient was evaluated and treated. All patient's and/or POA's questions/concerns answered on today's visit. -Continue diabetic foot care principles: inspect feet daily, monitor glucose as recommended by PCP and/or Endocrinologist, and follow prescribed diet per PCP, Endocrinologist and/or dietician. -Patient to continue soft, supportive shoe gear daily. -Toenails 1-5 b/l were debrided in length and girth with sterile nail nippers and dremel without iatrogenic bleeding.  -Callus(es) submet head 5 left foot pared utilizing sterile scalpel blade without complication or incident. Total number debrided =1. -Patient/POA to call should there be question/concern in the interim.  I explained to the patient the etiology of xerosis and various treatment  options were extensively discussed.  I explained to the patient the importance of maintaining moisturization of the skin with application of over-the-counter lotion such as Eucerin or  Luciderm.  Given that he has failed over-the-counter options I believe patient would benefit from ammonium lactate.  Ammonium lactate was sent to the pharmacy.   Return in about 3 months (around 07/02/2022) for [place] .  Candelaria Stagers, DPM

## 2022-04-02 ENCOUNTER — Other Ambulatory Visit (HOSPITAL_COMMUNITY): Payer: Self-pay

## 2022-04-13 DIAGNOSIS — H52223 Regular astigmatism, bilateral: Secondary | ICD-10-CM | POA: Diagnosis not present

## 2022-04-18 ENCOUNTER — Other Ambulatory Visit (HOSPITAL_COMMUNITY): Payer: Self-pay

## 2022-05-01 ENCOUNTER — Other Ambulatory Visit (HOSPITAL_COMMUNITY): Payer: Self-pay

## 2022-05-02 ENCOUNTER — Other Ambulatory Visit (HOSPITAL_COMMUNITY): Payer: Self-pay

## 2022-05-05 ENCOUNTER — Other Ambulatory Visit (HOSPITAL_COMMUNITY): Payer: Self-pay

## 2022-05-23 ENCOUNTER — Other Ambulatory Visit (HOSPITAL_COMMUNITY): Payer: Self-pay

## 2022-06-05 ENCOUNTER — Encounter (HOSPITAL_COMMUNITY): Payer: Self-pay

## 2022-06-05 ENCOUNTER — Ambulatory Visit (HOSPITAL_COMMUNITY)
Admission: EM | Admit: 2022-06-05 | Discharge: 2022-06-05 | Disposition: A | Discharge status: Home / Self Care | Payer: No Typology Code available for payment source | Attending: Physician Assistant | Admitting: Physician Assistant

## 2022-06-05 ENCOUNTER — Other Ambulatory Visit (HOSPITAL_COMMUNITY): Payer: Self-pay

## 2022-06-05 DIAGNOSIS — I10 Essential (primary) hypertension: Secondary | ICD-10-CM | POA: Diagnosis not present

## 2022-06-05 DIAGNOSIS — J440 Chronic obstructive pulmonary disease with acute lower respiratory infection: Secondary | ICD-10-CM | POA: Diagnosis not present

## 2022-06-05 DIAGNOSIS — J209 Acute bronchitis, unspecified: Secondary | ICD-10-CM | POA: Diagnosis not present

## 2022-06-05 MED ORDER — ALBUTEROL SULFATE HFA 108 (90 BASE) MCG/ACT IN AERS
2.0000 | INHALATION_SPRAY | Freq: Four times a day (QID) | RESPIRATORY_TRACT | 0 refills | Status: AC | PRN
  Filled 2022-06-05: qty 6.7, 25d supply, fill #0
  Filled 2022-06-05: qty 18, 25d supply, fill #0

## 2022-06-05 MED ORDER — AZITHROMYCIN 200 MG/5ML PO SUSR
ORAL | 0 refills | Status: DC
  Filled 2022-06-05: qty 60, fill #0
  Filled 2022-06-05: qty 60, 5d supply, fill #0

## 2022-06-05 NOTE — ED Triage Notes (Signed)
Pt is here for chest congestion x4days

## 2022-06-05 NOTE — ED Provider Notes (Signed)
[facility]    CSN: [id number] Arrival date & time: 06/05/22  0931      History   Chief Complaint Chief Complaint  Patient presents with   chest congestion    HPI Ethan Pierce is a 76 y.o. male.   Patient presents today companied by his wife who help provide majority of history.  Reports a 5+ day history of URI symptoms including congestion, chest tightness, cough that has worsened significantly prompting evaluation.  He has a history of recurrent bronchitis and states current symptoms are similar to previous episodes of this condition.  He has not been taking any over-the-counter medication for symptom management.  He has been using his albuterol inhaler with improvement but not resolution.  Denies any known sick contacts but does go to church she could have been exposed to someone.  He has had COVID with last episode June 2023.  Following this episode he developed recurrent bronchitis and was treated with azithromycin.  Denies additional antibiotics since that time.  He denies any recent steroid use but reports that he does not tolerate these well as a result of associated hyperglycemia and hypertension and prefers to use them as a last resort.  He has had a COVID-vaccine.  He has not yet had his flu shot or pneumonia vaccine.  Blood pressure was elevated today.  Patient denies any current headache, chest pain, shortness of breath, dizziness, visual disturbance.  Reports that he took his medication approximately 1 hour ago before being evaluated.  He is monitoring his blood pressure at home.  Denies any decongestant use, increased NSAIDs, caffeine, sodium.    Past Medical History:  Diagnosis Date   Bronchitis    Diabetes mellitus without complication (HCC)    GERD (gastroesophageal reflux disease)    History of 2019 novel coronavirus disease (COVID-19) 05/2019   Hypertension     Patient Active Problem List   Diagnosis Date Noted   Allergic rhinitis 12/19/2021    Anemia 12/19/2021   Anxiety 12/19/2021   COVID-19 12/19/2021   Disorder of refraction and accommodation 12/19/2021   Enthesopathy of ankle and tarsus 12/19/2021   Erectile dysfunction 12/19/2021   Hyperlipidemia 12/19/2021   Legal blindness, as defined in [place] 12/19/2021   Low back pain 12/19/2021   Major depressive disorder, recurrent, moderate (HCC) 12/19/2021   Meniere's disease, bilateral 12/19/2021   Hearing loss in right ear 11/27/2021   History of 2019 novel coronavirus disease (COVID-19) 09/19/2021   Onycholysis 09/19/2021   Peripheral neuropathy 09/19/2021   Plantar fascial fibromatosis 09/19/2021   Sleep apnea 09/19/2021   Abnormal gait 09/19/2021   Cataract 09/19/2021   Bunionette of left foot 09/19/2021   Chronic fatigue syndrome 09/19/2021   Chronic post-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 09/19/2021   Encounter for fitting and adjustment of spectacles and contact lenses 09/19/2021   Sinusitis, acute maxillary 01/08/2021   Diabetes mellitus type 2, uncontrolled, with complications 06/05/2019   Essential hypertension 06/05/2019   Pneumonia due to COVID-19 virus 06/03/2019   Type 2 diabetes mellitus (HCC) 06/03/2019   Fall 06/03/2019   Lumbar radiculopathy 03/03/2018   Diverticulosis of colon 09/09/2012   Hypertension associated with diabetes (HCC) 08/08/2012   GERD (gastroesophageal reflux disease) 08/08/2012    Past Surgical History:  Procedure Laterality Date   CATARACT EXTRACTION, BILATERAL  2018, 2019   WISDOM TOOTH EXTRACTION  05/2019       Home Medications    Prior to Admission medications   Medication Sig Start Date  End Date Taking? Authorizing Provider  azithromycin (ZITHROMAX) 200 MG/5ML suspension 500 mg (12.5 mL) day 1 and 250 mg (6.25 mL) days 2 through 5. 06/05/22  Yes Lynnel Zanetti K, PA-C  acetaminophen (TYLENOL) 160 MG/5ML elixir Take 15.6 mLs (500 mg total) by mouth every 4 (four) hours as needed for fever. 05/29/19   Arby Barrette, MD   acetaminophen (TYLENOL) 160 MG/5ML suspension 1 TEASPOONFUL BY MOUTH 3 TIMES A DAY AS NEEDED FOR PAIN, REPLACES ACETAMINOPHEN TABLETS 09/03/20   [provider]  albuterol (VENTOLIN HFA) 108 (90 Base) MCG/ACT inhaler INHALE 2 PUFFS BY MOUTH INTO THE LUNGS EVERY 6 HOURS AS NEEDED FOR WHEEZING OR SHORTNESS OF BREATH 06/05/22   Drayke Grabel K, PA-C  amLODipine (NORVASC) 10 MG tablet Take 1 tablet by mouth once a day in the morning for blood pressure. 08/18/21     ammonium lactate (AMLACTIN) 12 % lotion Apply 1 Application topically as needed for dry skin. 04/01/22   Candelaria Stagers, DPM  atorvastatin (LIPITOR) 20 MG tablet TAKE ONE-HALF TABLET BY MOUTH AT BEDTIME FOR CHOLESTEROL 09/03/20   [provider]  benzonatate (TESSALON) 200 MG capsule Take 1 capsule by mouth three times per day as needed for cough for up to 10 days. 12/18/21     bimatoprost (LUMIGAN) 0.01 % SOLN 1 drop into affected eye in the evening    [provider]  bimatoprost (LUMIGAN) 0.03 % ophthalmic solution Place 1 drop into both eyes at bedtime.    [provider]  butalbital-acetaminophen-caffeine (FIORICET) 50-325-40 MG tablet TAKE 1 TABLET BY MOUTH FOUR TIMES A DAY AS NEEDED FOR MIGRAINE.MAXIMUM DOSE IS 6 TABLETS PER DAY.THESE TABLETS CONTAIN 325MG  ACETAMINOPHEN (TYLENOL, APAP). MAXIMUM SAFE DOSE OF ACETAMINOPHEN IS 3000MG  PER DAY. 02/13/21   [provider]  Butalbital-APAP-Caffeine 50-325-40 MG capsule 1 capsule as needed    [provider]  calcipotriene (DOVONOX) 0.005 % cream APPLY SMALL AMOUNT TOPICALLY 3 TIMES A DAY AS NEEDED FOR FEET 07/07/21   [provider]  Calcipotriene 0.005 % solution APPLY SMALL AMOUNT TOPICALLY THREE TIMES A DAY 10/07/18   [provider]  citalopram (CELEXA) 40 MG tablet 0.5 tablet    [provider]  cyanocobalamin (,VITAMIN B-12,) 1000 MCG/ML injection 1 ml 09/05/18   [provider]  dapagliflozin propanediol  (FARXIGA) 5 MG TABS tablet 1 tablet 05/25/19   [provider]  diclofenac Sodium (VOLTAREN) 1 % GEL APPLY 4 GRAMS TOPICALLY FOUR TIMES A DAY AS NEEDED FOR HIP PAIN 02/13/21   [provider]  diphenhydrAMINE-Acetaminophen (TYLENOL COLD RELIEF PO) Take by mouth as needed.    [provider]  fluticasone (FLONASE) 50 MCG/ACT nasal spray 1 spray in each nostril 09/03/20   [provider]  fluticasone (VERAMYST) 27.5 MCG/SPRAY nasal spray Place 2 sprays into the nose daily.    [provider]  glimepiride (AMARYL) 4 MG tablet 1 tablet with breakfast or the first main meal of the day 07/08/15   [provider]  guaiFENesin (ROBITUSSIN) 100 MG/5ML liquid TAKE 2 TEASPOONSFUL BY MOUTH EVERY 4 HOURS AS NEEDED FOR COUGH 04/17/21   [provider]  ipratropium (ATROVENT) 0.03 % nasal spray PLACE 2 SPRAYS INTO BOTH NOSTRILS EVERY 12 HOURS 05/13/21 05/13/22  07/13/21, MD  L-Methylfolate-Algae-B12-B6 07/13/22) 3-90.314-2-35 MG CAPS 1 capsule 05/25/19   [provider]  lactulose (CEPHULAC) 10 g packet 1 packet    [provider]  loratadine (CLARITIN) 10 MG tablet TAKE ONE TABLET  BY MOUTH EVERY DAY FOR ALLERGIES AND NASAL CONGESTION 04/17/21   [provider]  loratadine (SB LORATADINE) 5 MG/5ML syrup Take 10 mLs (10 mg total) by mouth daily as needed for allergies or rhinitis. 01/08/21   Glenford Bayley, NP  losartan (COZAAR) 25 MG tablet Take 25 mg by mouth. 04/07/21   [provider]  meclizine (ANTIVERT) 25 MG tablet 1 tablet as needed    [provider]  Melatonin 3 MG TABS Take 3 mg by mouth at bedtime.    [provider]  metoprolol tartrate (LOPRESSOR) 25 MG tablet Take 1 tablet (25 mg total) by mouth 2 (two) times daily. 06/08/19   Marinda Elk, MD  molnupiravir EUA (LAGEVRIO) 200 MG CAPS capsule Take 4 capsules by mouth every 12 hrs for 5 days. 01/22/22     Multiple  Vitamins-Minerals (CENTRUM SILVER 50+MEN) TABS Take 2 tablets by mouth daily.    [provider]  oxybutynin (DITROPAN) 5 MG tablet 1 tablet    [provider]  PARoxetine (PAXIL) 20 MG tablet TAKE ONE TABLET BY MOUTH AT BEDTIME FOR 90 DAYS FOR DEPRESSION AND ANXIETY 06/04/21   [provider]  pioglitazone (ACTOS) 15 MG tablet 1 tablet 03/23/16   [provider]  Pitavastatin Calcium (LIVALO) 4 MG TABS 1 tablet 06/30/16   [provider]  predniSONE (DELTASONE) 10 MG tablet Take 3 tablets by mouth in the morning for 3 days.  Take 2 tablets in the morning for 3 days.  Then take 1 tablet in the morning for 4 days. 12/18/21     sildenafil (VIAGRA) 100 MG tablet 1 tablet as needed    [provider]  sitaGLIPtin (JANUVIA) 50 MG tablet 1 tablet 02/27/19   [provider]  sodium chloride (OCEAN) 0.65 % nasal spray SPRAY 2 SPRAYS INTO EACH NOSTRIL TWO TIMES A DAY 02/13/21   [provider]  terbinafine (LAMISIL) 1 % cream 1 application to affected area    [provider]  traZODone (DESYREL) 100 MG tablet 1 tablet at bedtime.    [provider]  urea (UREA 20 INTENSIVE HYDRATING) 20 % cream 1 application to affected area as needed    [provider]  valsartan-hydrochlorothiazide (DIOVAN-HCT) 160-12.5 MG tablet Take 1 tablet by mouth daily for blood pressure 02/05/22     venlafaxine XR (EFFEXOR-XR) 75 MG 24 hr capsule 1 capsule with food 10/31/18   [provider]    Family History Family History  Problem Relation Age of Onset   Diabetes Mother    GER disease Mother    Cancer Father        pancreas   Diabetes Sister    Diabetes Brother     Social History Social History   Tobacco Use   Smoking status: Never   Smokeless tobacco: Never  Vaping Use   Vaping Use: Never used  Substance Use Topics   Alcohol use: No   Drug use: No     Allergies   Penicillin g, Penicillins, Trazodone and  nefazodone, Ativan [lorazepam], Atorvastatin, Decongestant [pseudoephedrine hcl er], Flunisolide, Lactose, Lisinopril, and Talwin [pentazocine]   Review of Systems Review of Systems  Constitutional:  Positive for activity change. Negative for appetite change, fatigue and fever.  HENT:  Positive for congestion. Negative for sinus pressure, sneezing and sore throat.   Respiratory:  Positive for cough and chest tightness. Negative for shortness of breath.   Cardiovascular:  Negative for chest pain.  Gastrointestinal:  Negative for abdominal pain, diarrhea, nausea and vomiting.  Neurological:  Negative for dizziness, light-headedness and headaches.     Physical Exam Triage Vital Signs ED Triage Vitals  Enc Vitals Group     BP 06/05/22 1007 (!) 181/80     Pulse Rate 06/05/22 1007 82     Resp 06/05/22 1007 16     Temp 06/05/22 1007 98.1 F (36.7 C)     Temp Source 06/05/22 1007 Oral     SpO2 06/05/22 1007 97 %     Weight --      Height --      Head Circumference --      Peak Flow --      Pain Score 06/05/22 1009 0     Pain Loc --      Pain Edu? --      Excl. in [place]? --    No data found.  Updated Vital Signs BP (!) 177/75 (BP Location: Left Arm)   Pulse 78   Temp 98.1 F (36.7 C) (Oral)   Resp 12   SpO2 97%   Visual Acuity Right Eye Distance:   Left Eye Distance:   Bilateral Distance:    Right Eye Near:   Left Eye Near:    Bilateral Near:     Physical Exam Vitals reviewed.  Constitutional:      General: He is awake.     Appearance: Normal appearance. He is well-developed. He is not ill-appearing.     Comments: Very pleasant male appears stated age in no acute distress sitting comfortably in exam room  HENT:     Head: Normocephalic and atraumatic.     Right Ear: Tympanic membrane, ear canal and external ear normal. Tympanic membrane is not erythematous or bulging.     Left Ear: Tympanic membrane, ear canal and external ear normal. Tympanic membrane is not  erythematous or bulging.     Nose: Nose normal.     Mouth/Throat:     Pharynx: Uvula midline. No oropharyngeal exudate or posterior oropharyngeal erythema.  Cardiovascular:     Rate and Rhythm: Normal rate and regular rhythm.     Heart sounds: Normal heart sounds, S1 normal and S2 normal. No murmur heard. Pulmonary:     Effort: Pulmonary effort is normal. No accessory muscle usage or respiratory distress.     Breath sounds: Normal breath sounds. No stridor. No wheezing, rhonchi or rales.     Comments: Clear to auscultation bilaterally Neurological:     Mental Status: He is alert.  Psychiatric:        Behavior: Behavior is cooperative.      UC Treatments / Results  Labs (all labs ordered are listed, but only abnormal results are displayed) Labs Reviewed - No data to display  EKG   Radiology No results found.  Procedures Procedures (including critical care time)  Medications Ordered in UC Medications - No data to display  Initial Impression / Assessment and Plan / UC Course  I have reviewed the triage vital signs and the nursing notes.  Pertinent labs & imaging results that were available during my care of the patient were reviewed by me and considered in my medical decision making (see chart for details).     Patient is well-appearing, afebrile, nontoxic, nontachycardic.  No indication for viral testing as he has been symptomatic for 5 days and this would not change management since he is outside the window of effectiveness for Tamiflu.  X-ray was deferred as  oxygen saturation is 97% with no adventitious sounds on exam.  Discussed that I do not see a reason to start antibiotics, however, patient reports that he has history of chronic bronchitis and requires azithromycin for resolution.  He was provided a prescription with instruction to hold this medication for several days and if his symptoms improve he should not start the medication.  Offered short course of prednisone  but patient declined this given side effects in the past.  He was provided a refill of albuterol with instruction to use this every 4-6 hours as needed.  He can use over-the-counter medication including Mucinex and Flonase for symptom relief.  Recommended that he rest and drink plenty of fluid.  He is to follow-up with his primary care next week.  Discussed that if he has any worsening symptoms he needs to go to the emergency room immediately to which he expressed understanding.  Blood pressure is elevated today.  Patient denies any signs/symptoms of endorgan damage.  Recommended that he monitor this at home.  Discussed that he is to avoid decongestants, NSAIDs, caffeine, sodium.  If he develops any chest pain, shortness of breath, headache, vision change, dizziness in the setting of high blood pressure he is to go to the emergency room.  Recommend follow-up with his PCP first of next week.  Final Clinical Impressions(s) / UC Diagnoses   Final diagnoses:  Bronchitis, chronic obstructive w acute bronchitis (HCC)  Elevated blood pressure reading with diagnosis of hypertension     Discharge Instructions      I have refilled your azithromycin as we discussed.  Use your albuterol inhaler every 4-6 hours as needed.  Continue over-the-counter medication including Mucinex and Flonase for symptom relief.  If your symptoms are not improving or if anything worsens you need to be seen immediately.  Follow-up with your PCP next week.  Your blood pressure is very elevated.  I assume this is because you are sick.  Please monitor this closely at home.  If you develop any chest pain, shortness of breath, headache, vision change, dizziness in the setting of high blood pressure you need to go to the emergency room.  Avoid caffeine, sodium (salt), decongestants, NSAIDs (aspirin, ibuprofen/Advil, naproxen/Aleve).  Follow-up with your PCP next week.     ED Prescriptions     Medication Sig Dispense Auth. Provider    albuterol (VENTOLIN HFA) 108 (90 Base) MCG/ACT inhaler INHALE 2 PUFFS BY MOUTH INTO THE LUNGS EVERY 6 HOURS AS NEEDED FOR WHEEZING OR SHORTNESS OF BREATH 18 g Jenyfer Trawick K, PA-C   azithromycin (ZITHROMAX) 200 MG/5ML suspension 500 mg (12.5 mL) day 1 and 250 mg (6.25 mL) days 2 through 5. 37.5 mL Bethany Cumming K, PA-C      PDMP not reviewed this encounter.   Jeani HawkingRaspet, Arelie Kuzel K, PA-C 06/05/22 1103

## 2022-06-05 NOTE — Discharge Instructions (Signed)
I have refilled your azithromycin as we discussed.  Use your albuterol inhaler every 4-6 hours as needed.  Continue over-the-counter medication including Mucinex and Flonase for symptom relief.  If your symptoms are not improving or if anything worsens you need to be seen immediately.  Follow-up with your PCP next week.  Your blood pressure is very elevated.  I assume this is because you are sick.  Please monitor this closely at home.  If you develop any chest pain, shortness of breath, headache, vision change, dizziness in the setting of high blood pressure you need to go to the emergency room.  Avoid caffeine, sodium (salt), decongestants, NSAIDs (aspirin, ibuprofen/Advil, naproxen/Aleve).  Follow-up with your PCP next week.

## 2022-06-26 ENCOUNTER — Other Ambulatory Visit (HOSPITAL_COMMUNITY): Payer: Self-pay

## 2022-07-15 ENCOUNTER — Ambulatory Visit: Payer: No Typology Code available for payment source | Admitting: Podiatry

## 2022-07-21 ENCOUNTER — Ambulatory Visit: Payer: No Typology Code available for payment source | Admitting: Podiatry

## 2022-07-24 ENCOUNTER — Other Ambulatory Visit (HOSPITAL_COMMUNITY): Payer: Self-pay

## 2022-08-07 ENCOUNTER — Other Ambulatory Visit (HOSPITAL_COMMUNITY): Payer: Self-pay

## 2022-08-20 ENCOUNTER — Ambulatory Visit (INDEPENDENT_AMBULATORY_CARE_PROVIDER_SITE_OTHER): Payer: No Typology Code available for payment source | Admitting: Family Medicine

## 2022-08-20 VITALS — BP 153/71 | HR 103 | Temp 98.1°F | Resp 16 | Ht 68.0 in | Wt 175.8 lb

## 2022-08-20 DIAGNOSIS — B351 Tinea unguium: Secondary | ICD-10-CM | POA: Insufficient documentation

## 2022-08-20 DIAGNOSIS — H40021 Open angle with borderline findings, high risk, right eye: Secondary | ICD-10-CM | POA: Insufficient documentation

## 2022-08-20 DIAGNOSIS — F515 Nightmare disorder: Secondary | ICD-10-CM | POA: Insufficient documentation

## 2022-08-20 DIAGNOSIS — Z7689 Persons encountering health services in other specified circumstances: Secondary | ICD-10-CM

## 2022-08-20 DIAGNOSIS — R42 Dizziness and giddiness: Secondary | ICD-10-CM | POA: Insufficient documentation

## 2022-08-20 DIAGNOSIS — E119 Type 2 diabetes mellitus without complications: Secondary | ICD-10-CM | POA: Insufficient documentation

## 2022-08-20 DIAGNOSIS — F329 Major depressive disorder, single episode, unspecified: Secondary | ICD-10-CM | POA: Insufficient documentation

## 2022-08-20 DIAGNOSIS — Z7729 Contact with and (suspected ) exposure to other hazardous substances: Secondary | ICD-10-CM | POA: Insufficient documentation

## 2022-08-20 DIAGNOSIS — I10 Essential (primary) hypertension: Secondary | ICD-10-CM

## 2022-08-20 DIAGNOSIS — E785 Hyperlipidemia, unspecified: Secondary | ICD-10-CM

## 2022-08-20 DIAGNOSIS — M25552 Pain in left hip: Secondary | ICD-10-CM | POA: Insufficient documentation

## 2022-08-20 DIAGNOSIS — Z659 Problem related to unspecified psychosocial circumstances: Secondary | ICD-10-CM | POA: Insufficient documentation

## 2022-08-20 DIAGNOSIS — G43909 Migraine, unspecified, not intractable, without status migrainosus: Secondary | ICD-10-CM | POA: Insufficient documentation

## 2022-08-20 NOTE — Progress Notes (Signed)
Patient is here to established care with provider today. Patient has many health concern they would like to discuss with provider today  Care gaps discuss at appointment today  

## 2022-08-24 ENCOUNTER — Encounter: Payer: Self-pay | Admitting: Family Medicine

## 2022-08-24 NOTE — Progress Notes (Signed)
New Patient Office Visit  Subjective    Patient ID: Ethan Pierce, male    DOB: Apr 01, 1946  Age: 77 y.o. MRN: [id number]  CC:  Chief Complaint  Patient presents with   Annual Exam    HPI Ethan Pierce presents to establish care and for review of chronic med issues. Patient denies acute complaints or concerns. Patient also gets some care from [place].    Outpatient Encounter Medications as of 08/20/2022  Medication Sig   metoprolol tartrate (LOPRESSOR) 25 MG tablet Take 25 mg by mouth 2 (two) times daily.   albuterol (VENTOLIN HFA) 108 (90 Base) MCG/ACT inhaler Inhale 1 to 2 puffs into the lungs every 6 hours.   albuterol (VENTOLIN HFA) 108 (90 Base) MCG/ACT inhaler Inhale 2 puffs into the lungs every 6 (six) hours as needed for wheezing or shortness of breath.   amLODipine (NORVASC) 10 MG tablet Take 1 tablet by mouth once a day in the morning for blood pressure.   fluticasone (FLONASE) 50 MCG/ACT nasal spray 1 spray in each nostril   ipratropium (ATROVENT) 0.03 % nasal spray PLACE 2 SPRAYS INTO BOTH NOSTRILS EVERY 12 HOURS   valsartan-hydrochlorothiazide (DIOVAN-HCT) 160-12.5 MG tablet Take 1 tablet by mouth daily for blood pressure   [DISCONTINUED] acetaminophen (TYLENOL) 160 MG/5ML elixir Take 15.6 mLs (500 mg total) by mouth every 4 (four) hours as needed for fever.   [DISCONTINUED] acetaminophen (TYLENOL) 160 MG/5ML suspension 1 TEASPOONFUL BY MOUTH 3 TIMES A DAY AS NEEDED FOR PAIN, REPLACES ACETAMINOPHEN TABLETS   [DISCONTINUED] ammonium lactate (AMLACTIN) 12 % lotion Apply 1 Application topically as needed for dry skin.   [DISCONTINUED] atorvastatin (LIPITOR) 20 MG tablet TAKE ONE-HALF TABLET BY MOUTH AT BEDTIME FOR CHOLESTEROL   [DISCONTINUED] azithromycin (ZITHROMAX) 200 MG/5ML suspension Take 500 mg (12.5 mL) day 1 and 250 mg (6.25 mL) days 2 through 5. Discard remainder   [DISCONTINUED] benzonatate (TESSALON) 200 MG capsule Take 1 capsule by mouth three times per day as  needed for cough for up to 10 days.   [DISCONTINUED] bimatoprost (LUMIGAN) 0.01 % SOLN 1 drop into affected eye in the evening   [DISCONTINUED] bimatoprost (LUMIGAN) 0.03 % ophthalmic solution Place 1 drop into both eyes at bedtime.   [DISCONTINUED] butalbital-acetaminophen-caffeine (FIORICET) 50-325-40 MG tablet TAKE 1 TABLET BY MOUTH FOUR TIMES A DAY AS NEEDED FOR MIGRAINE.MAXIMUM DOSE IS 6 TABLETS PER DAY.THESE TABLETS CONTAIN 325MG  ACETAMINOPHEN (TYLENOL, APAP). MAXIMUM SAFE DOSE OF ACETAMINOPHEN IS 3000MG  PER DAY.   [DISCONTINUED] Butalbital-APAP-Caffeine 50-325-40 MG capsule 1 capsule as needed   [DISCONTINUED] calcipotriene (DOVONOX) 0.005 % cream APPLY SMALL AMOUNT TOPICALLY 3 TIMES A DAY AS NEEDED FOR FEET   [DISCONTINUED] Calcipotriene 0.005 % solution APPLY SMALL AMOUNT TOPICALLY THREE TIMES A DAY   [DISCONTINUED] citalopram (CELEXA) 40 MG tablet 0.5 tablet   [DISCONTINUED] cyanocobalamin (,VITAMIN B-12,) 1000 MCG/ML injection 1 ml   [DISCONTINUED] dapagliflozin propanediol (FARXIGA) 5 MG TABS tablet 1 tablet   [DISCONTINUED] diclofenac Sodium (VOLTAREN) 1 % GEL APPLY 4 GRAMS TOPICALLY FOUR TIMES A DAY AS NEEDED FOR HIP PAIN   [DISCONTINUED] diphenhydrAMINE-Acetaminophen (TYLENOL COLD RELIEF PO) Take by mouth as needed.   [DISCONTINUED] fluticasone (VERAMYST) 27.5 MCG/SPRAY nasal spray Place 2 sprays into the nose daily.   [DISCONTINUED] glimepiride (AMARYL) 4 MG tablet 1 tablet with breakfast or the first main meal of the day   [DISCONTINUED] guaiFENesin (ROBITUSSIN) 100 MG/5ML liquid TAKE 2 TEASPOONSFUL BY MOUTH EVERY 4 HOURS AS NEEDED FOR COUGH   [DISCONTINUED] L-Methylfolate-Algae-B12-B6 (  METANX) 3-90.314-2-35 MG CAPS 1 capsule   [DISCONTINUED] lactulose (CEPHULAC) 10 g packet 1 packet   [DISCONTINUED] loratadine (CLARITIN) 10 MG tablet TAKE ONE TABLET BY MOUTH EVERY DAY FOR ALLERGIES AND NASAL CONGESTION   [DISCONTINUED] loratadine (SB LORATADINE) 5 MG/5ML syrup Take 10 mLs (10 mg  total) by mouth daily as needed for allergies or rhinitis.   [DISCONTINUED] losartan (COZAAR) 25 MG tablet Take 25 mg by mouth.   [DISCONTINUED] meclizine (ANTIVERT) 25 MG tablet 1 tablet as needed   [DISCONTINUED] Melatonin 3 MG TABS Take 3 mg by mouth at bedtime.   [DISCONTINUED] metoprolol tartrate (LOPRESSOR) 25 MG tablet Take 1 tablet (25 mg total) by mouth 2 (two) times daily.   [DISCONTINUED] molnupiravir EUA (LAGEVRIO) 200 MG CAPS capsule Take 4 capsules by mouth every 12 hrs for 5 days.   [DISCONTINUED] Multiple Vitamins-Minerals (CENTRUM SILVER 50+MEN) TABS Take 2 tablets by mouth daily.   [DISCONTINUED] oxybutynin (DITROPAN) 5 MG tablet 1 tablet   [DISCONTINUED] PARoxetine (PAXIL) 20 MG tablet TAKE ONE TABLET BY MOUTH AT BEDTIME FOR 90 DAYS FOR DEPRESSION AND ANXIETY   [DISCONTINUED] pioglitazone (ACTOS) 15 MG tablet 1 tablet   [DISCONTINUED] Pitavastatin Calcium (LIVALO) 4 MG TABS 1 tablet   [DISCONTINUED] predniSONE (DELTASONE) 10 MG tablet Take 3 tablets by mouth in the morning for 3 days.  Take 2 tablets in the morning for 3 days.  Then take 1 tablet in the morning for 4 days.   [DISCONTINUED] sildenafil (VIAGRA) 100 MG tablet 1 tablet as needed   [DISCONTINUED] sitaGLIPtin (JANUVIA) 50 MG tablet 1 tablet   [DISCONTINUED] sodium chloride (OCEAN) 0.65 % nasal spray SPRAY 2 SPRAYS INTO EACH NOSTRIL TWO TIMES A DAY   [DISCONTINUED] terbinafine (LAMISIL) 1 % cream 1 application to affected area   [DISCONTINUED] traZODone (DESYREL) 100 MG tablet 1 tablet at bedtime.   [DISCONTINUED] urea (UREA 20 INTENSIVE HYDRATING) 20 % cream 1 application to affected area as needed   [DISCONTINUED] venlafaxine XR (EFFEXOR-XR) 75 MG 24 hr capsule 1 capsule with food   No facility-administered encounter medications on file as of 08/20/2022.    Past Medical History:  Diagnosis Date   Bronchitis    Diabetes mellitus without complication (HCC)    GERD (gastroesophageal reflux disease)    History of  2019 novel coronavirus disease (COVID-19) 05/2019   Hypertension     Past Surgical History:  Procedure Laterality Date   CATARACT EXTRACTION, BILATERAL  2018, 2019   WISDOM TOOTH EXTRACTION  05/2019    Family History  Problem Relation Age of Onset   Diabetes Mother    GER disease Mother    Cancer Father        pancreas   Diabetes Sister    Diabetes Brother     Social History   Socioeconomic History   Marital status: Married    Spouse name: Not on file   Number of children: 2   Years of education: Not on file   Highest education level: Not on file  Occupational History   Not on file  Tobacco Use   Smoking status: Never   Smokeless tobacco: Never  Vaping Use   Vaping Use: Never used  Substance and Sexual Activity   Alcohol use: No   Drug use: No   Sexual activity: Yes  Other Topics Concern   Not on file  Social History Narrative   Not on file   Social Determinants of Health   Financial Resource Strain: Not on file  Food Insecurity: Not on file  Transportation Needs: Not on file  Physical Activity: Not on file  Stress: Not on file  Social Connections: Not on file  Intimate Partner Violence: Not on file    Review of Systems  All other systems reviewed and are negative.       Objective    BP (!) 153/71   Pulse (!) 103   Temp 98.1 F (36.7 C) (Oral)   Resp 16   Ht 5\' 8"  (1.727 m)   Wt 175 lb 12.8 oz (79.7 kg)   SpO2 96%   BMI 26.73 kg/m   Physical Exam Vitals and nursing note reviewed.  Constitutional:      General: He is not in acute distress. Cardiovascular:     Rate and Rhythm: Normal rate and regular rhythm.  Pulmonary:     Effort: Pulmonary effort is normal.     Breath sounds: Normal breath sounds.  Abdominal:     Palpations: Abdomen is soft.     Tenderness: There is no abdominal tenderness.  Skin:    Findings: No bruising.  Neurological:     General: No focal deficit present.     Mental Status: He is alert and oriented to  person, place, and time.         Assessment & Plan:   1. Essential hypertension Slightly elevated.   2. Hyperlipidemia, unspecified hyperlipidemia type continue  3. Encounter to establish care     Return in about 6 months (around 02/18/2023) for follow up.   Becky Sax, MD

## 2022-09-28 ENCOUNTER — Other Ambulatory Visit (HOSPITAL_COMMUNITY): Payer: Self-pay

## 2022-10-06 ENCOUNTER — Telehealth: Payer: Self-pay | Admitting: *Deleted

## 2022-10-06 NOTE — Telephone Encounter (Signed)
Patient was called and advise that we do not have 3 years worth of medical records on him from  [place]. Patient said he was not aware that [facility] need 3 years of records and he will call them to clarify details.   Copied from [place] [id number]. Topic: General - Other >> Sep 30, 2022  4:27 PM [id number] J wrote: Reason for CRM: pt's broker called in to follow up on APS form completion.  Form was faxed on 2/19. Broker says that some records were received but not the APS form that need to be completed by the provider. Showing records has been scanned in Media under [place]. Please assist further with getting form faxed/ emailed back to the number/email provided on form.

## 2022-10-12 DIAGNOSIS — H26491 Other secondary cataract, right eye: Secondary | ICD-10-CM | POA: Diagnosis not present

## 2022-10-12 DIAGNOSIS — H40023 Open angle with borderline findings, high risk, bilateral: Secondary | ICD-10-CM | POA: Diagnosis not present

## 2022-10-19 ENCOUNTER — Other Ambulatory Visit (HOSPITAL_COMMUNITY): Payer: Self-pay

## 2022-11-11 DIAGNOSIS — H524 Presbyopia: Secondary | ICD-10-CM | POA: Diagnosis not present

## 2022-11-11 DIAGNOSIS — H52223 Regular astigmatism, bilateral: Secondary | ICD-10-CM | POA: Diagnosis not present

## 2022-12-21 ENCOUNTER — Ambulatory Visit: Payer: No Typology Code available for payment source | Admitting: Family Medicine

## 2023-01-07 ENCOUNTER — Other Ambulatory Visit: Payer: Self-pay | Admitting: Family Medicine

## 2023-01-07 ENCOUNTER — Other Ambulatory Visit: Payer: Self-pay | Admitting: *Deleted

## 2023-01-07 ENCOUNTER — Other Ambulatory Visit (HOSPITAL_COMMUNITY): Payer: Self-pay

## 2023-01-07 MED ORDER — METOPROLOL TARTRATE 25 MG PO TABS
25.0000 mg | ORAL_TABLET | Freq: Two times a day (BID) | ORAL | 0 refills | Status: DC
  Filled 2023-01-07: qty 60, 30d supply, fill #0

## 2023-01-07 NOTE — Telephone Encounter (Signed)
Medication Refill - Medication: metoprolol tartrate (LOPRESSOR) 25 MG tablet  Pt states that he took his last pill this morning. Pt has an appt 01/26/23, pt is wanting to see if he can get a short supply until his next appt.   Has the patient contacted their pharmacy? No.   Preferred Pharmacy (with phone number or street name): [facility] [facility]  Phone: [phone] Fax: [phone]  Has the patient been seen for an appointment in the last year OR does the patient have an upcoming appointment? Yes.    Agent: Please be advised that RX refills may take up to 3 business days. We ask that you follow-up with your pharmacy.

## 2023-01-07 NOTE — Telephone Encounter (Signed)
Requested Prescriptions  Refused Prescriptions Disp Refills   metoprolol tartrate (LOPRESSOR) 25 MG tablet      Sig: Take 1 tablet (25 mg total) by mouth 2 (two) times daily.     Cardiovascular:  Beta Blockers Failed - 01/07/2023 12:26 PM      Failed - Last BP in normal range    BP Readings from Last 1 Encounters:  08/20/22 (!) 153/71         Passed - Last Heart Rate in normal range    Pulse Readings from Last 1 Encounters:  08/20/22 (!) 103         Passed - Valid encounter within last 6 months    Recent Outpatient Visits           4 months ago Essential hypertension    Primary Care at [facility], MD   10 years ago Bronchitis   Primary Care at [facility], Gwenlyn Found, MD       Future Appointments             In 2 weeks Georganna Skeans, MD [facility] at [facility]

## 2023-01-09 DIAGNOSIS — H524 Presbyopia: Secondary | ICD-10-CM | POA: Diagnosis not present

## 2023-01-09 DIAGNOSIS — H52223 Regular astigmatism, bilateral: Secondary | ICD-10-CM | POA: Diagnosis not present

## 2023-01-18 ENCOUNTER — Other Ambulatory Visit (HOSPITAL_COMMUNITY): Payer: Self-pay

## 2023-01-19 ENCOUNTER — Other Ambulatory Visit: Payer: Self-pay | Admitting: Family Medicine

## 2023-01-19 ENCOUNTER — Other Ambulatory Visit (HOSPITAL_COMMUNITY): Payer: Self-pay

## 2023-01-19 MED ORDER — VALSARTAN-HYDROCHLOROTHIAZIDE 160-12.5 MG PO TABS
1.0000 | ORAL_TABLET | Freq: Every day | ORAL | 4 refills | Status: DC
  Filled 2023-01-19: qty 90, 90d supply, fill #0

## 2023-01-19 NOTE — Telephone Encounter (Signed)
Medication Refill - Medication: alsartan-hydrochlorothiazide (DIOVAN-HCT) 160-12.5 MG tablet    Has the patient contacted their pharmacy? No.    [place] [facility]  [address] [place] [address]  Phone: [phone] Fax: [phone]  Hours: Mon-Fri 7:30am-6pm; Sat 8:00am-4:30pm    Has the patient been seen for an appointment in the last year OR does the patient have an upcoming appointment? Yes.    Agent: Please be advised that RX refills may take up to 3 business days. We ask that you follow-up with your pharmacy.

## 2023-01-19 NOTE — Telephone Encounter (Signed)
Please verify med reordered. There is a refusal and looks like reordered by ? Pharmacist- please review  Requested Prescriptions  Pending Prescriptions Disp Refills   valsartan-hydrochlorothiazide (DIOVAN-HCT) 160-12.5 MG tablet 90 tablet 4    Sig: Take 1 tablet by mouth daily for blood pressure     Cardiovascular: ARB + Diuretic Combos Failed - 01/19/2023  1:52 PM      Failed - K in normal range and within 180 days    Potassium  Date Value Ref Range Status  07/22/2021 3.4 (L) 3.5 - 5.1 mmol/L Final         Failed - Na in normal range and within 180 days    Sodium  Date Value Ref Range Status  07/22/2021 138 135 - 145 mmol/L Final         Failed - Cr in normal range and within 180 days    Creatinine, Ser  Date Value Ref Range Status  07/22/2021 1.02 0.61 - 1.24 mg/dL Final         Failed - eGFR is 10 or above and within 180 days    GFR calc Af Amer  Date Value Ref Range Status  06/25/2019 >60 >60 mL/min Final   GFR, Estimated  Date Value Ref Range Status  07/22/2021 >60 >60 mL/min Final    Comment:    (NOTE) Calculated using the CKD-EPI Creatinine Equation (2021)          Failed - Last BP in normal range    BP Readings from Last 1 Encounters:  08/20/22 (!) 153/71         Passed - Patient is not pregnant      Passed - Valid encounter within last 6 months    Recent Outpatient Visits           5 months ago Essential hypertension   [place] [facility] at [facility], MD   10 years ago Bronchitis   Primary Care at [facility], Gwenlyn Found, MD       Future Appointments             In 1 week Georganna Skeans, MD [facility] at [facility]

## 2023-01-26 ENCOUNTER — Encounter: Payer: Self-pay | Admitting: Family Medicine

## 2023-01-26 ENCOUNTER — Ambulatory Visit (INDEPENDENT_AMBULATORY_CARE_PROVIDER_SITE_OTHER): Payer: No Typology Code available for payment source | Admitting: Family Medicine

## 2023-01-26 ENCOUNTER — Other Ambulatory Visit (HOSPITAL_COMMUNITY): Payer: Self-pay

## 2023-01-26 ENCOUNTER — Other Ambulatory Visit: Payer: Self-pay

## 2023-01-26 VITALS — BP 145/69 | HR 78 | Temp 97.7°F | Resp 16 | Wt 172.6 lb

## 2023-01-26 DIAGNOSIS — E785 Hyperlipidemia, unspecified: Secondary | ICD-10-CM | POA: Diagnosis not present

## 2023-01-26 DIAGNOSIS — M6281 Muscle weakness (generalized): Secondary | ICD-10-CM | POA: Insufficient documentation

## 2023-01-26 DIAGNOSIS — I10 Essential (primary) hypertension: Secondary | ICD-10-CM

## 2023-01-26 DIAGNOSIS — K0262 Dental caries on smooth surface penetrating into dentin: Secondary | ICD-10-CM | POA: Insufficient documentation

## 2023-01-26 DIAGNOSIS — K0253 Dental caries on pit and fissure surface penetrating into pulp: Secondary | ICD-10-CM

## 2023-01-26 HISTORY — DX: Dental caries on smooth surface penetrating into dentin: K02.62

## 2023-01-26 HISTORY — DX: Dental caries on pit and fissure surface penetrating into pulp: K02.53

## 2023-01-26 MED ORDER — METOPROLOL TARTRATE 25 MG PO TABS
25.0000 mg | ORAL_TABLET | Freq: Two times a day (BID) | ORAL | 1 refills | Status: DC
  Filled 2023-01-26 – 2023-03-10 (×3): qty 180, 90d supply, fill #0

## 2023-01-26 MED ORDER — VALSARTAN-HYDROCHLOROTHIAZIDE 160-12.5 MG PO TABS
1.0000 | ORAL_TABLET | Freq: Every day | ORAL | 1 refills | Status: DC
  Filled 2023-01-26 – 2023-04-09 (×2): qty 90, 90d supply, fill #0

## 2023-01-26 NOTE — Progress Notes (Signed)
Patient is here for their 3 month follow-up Patient has no concerns today Care gaps have been discussed with patient  

## 2023-01-26 NOTE — Progress Notes (Signed)
Established Patient Office Visit  Subjective    Patient ID: Ethan Pierce, male    DOB: April 21, 1946  Age: 77 y.o. MRN: [id number]  CC: No chief complaint on file.   HPI [name] presents for routine follow up of hypertension.    Outpatient Encounter Medications as of 01/26/2023  Medication Sig   albuterol (VENTOLIN HFA) 108 (90 Base) MCG/ACT inhaler Inhale 2 puffs into the lungs every 6 (six) hours as needed for wheezing or shortness of breath.   amLODipine (NORVASC) 10 MG tablet Take 1 tablet by mouth once a day in the morning for blood pressure.   fluticasone (FLONASE) 50 MCG/ACT nasal spray 1 spray in each nostril   metoprolol tartrate (LOPRESSOR) 25 MG tablet Take 1 tablet (25 mg) by mouth 2 times daily.   valsartan-hydrochlorothiazide (DIOVAN-HCT) 160-12.5 MG tablet Take 1 tablet by mouth daily for blood pressure   albuterol (VENTOLIN HFA) 108 (90 Base) MCG/ACT inhaler Inhale 1 to 2 puffs into the lungs every 6 hours.   ipratropium (ATROVENT) 0.03 % nasal spray PLACE 2 SPRAYS INTO BOTH NOSTRILS EVERY 12 HOURS   No facility-administered encounter medications on file as of 01/26/2023.    Past Medical History:  Diagnosis Date   Bronchitis    Diabetes mellitus without complication (HCC)    GERD (gastroesophageal reflux disease)    History of 2019 novel coronavirus disease (COVID-19) 05/2019   Hypertension     Past Surgical History:  Procedure Laterality Date   CATARACT EXTRACTION, BILATERAL  2018, 2019   WISDOM TOOTH EXTRACTION  05/2019    Family History  Problem Relation Age of Onset   Diabetes Mother    GER disease Mother    Cancer Father        pancreas   Diabetes Sister    Diabetes Brother     Social History   Socioeconomic History   Marital status: Married    Spouse name: Not on file   Number of children: 2   Years of education: Not on file   Highest education level: Not on file  Occupational History   Not on file  Tobacco Use   Smoking  status: Never   Smokeless tobacco: Never  Vaping Use   Vaping Use: Never used  Substance and Sexual Activity   Alcohol use: No   Drug use: No   Sexual activity: Yes  Other Topics Concern   Not on file  Social History Narrative   Not on file   Social Determinants of Health   Financial Resource Strain: Not on file  Food Insecurity: Not on file  Transportation Needs: Not on file  Physical Activity: Not on file  Stress: Not on file  Social Connections: Not on file  Intimate Partner Violence: Not on file    Review of Systems  All other systems reviewed and are negative.       Objective    BP (!) 145/69   Pulse 78   Temp 97.7 F (36.5 C) (Oral)   Resp 16   Wt 172 lb 9.6 oz (78.3 kg)   SpO2 98%   BMI 26.24 kg/m   Physical Exam Vitals and nursing note reviewed.  Constitutional:      General: He is not in acute distress. Cardiovascular:     Rate and Rhythm: Normal rate and regular rhythm.  Pulmonary:     Effort: Pulmonary effort is normal.     Breath sounds: Normal breath sounds.  Abdominal:  Palpations: Abdomen is soft.     Tenderness: There is no abdominal tenderness.  Skin:    Findings: No bruising.  Neurological:     General: No focal deficit present.     Mental Status: He is alert and oriented to person, place, and time.         Assessment & Plan:   1. Essential hypertension Appears stable. Meds refilled. Continue   2. Hyperlipidemia, unspecified hyperlipidemia type Continue     Return in about 6 months (around 07/28/2023) for follow up.   [name], MD

## 2023-01-30 ENCOUNTER — Other Ambulatory Visit (HOSPITAL_COMMUNITY): Payer: Self-pay

## 2023-02-09 ENCOUNTER — Other Ambulatory Visit (HOSPITAL_COMMUNITY): Payer: Self-pay

## 2023-02-10 ENCOUNTER — Other Ambulatory Visit (HOSPITAL_COMMUNITY): Payer: Self-pay

## 2023-02-12 NOTE — Therapy (Signed)
OUTPATIENT PHYSICAL THERAPY THORACOLUMBAR EVALUATION   Patient Name: Ethan Pierce MRN: [id number] DOB:[birth year], 77 y.o., male Today's Date: 02/15/2023  END OF SESSION:  PT End of Session - 02/15/23 0942     Visit Number 1    Number of Visits 12    Date for PT Re-Evaluation 04/12/23    Authorization Type [place]    PT Start Time 0745    PT Stop Time 0830    PT Time Calculation (min) 45 min    Activity Tolerance Patient tolerated treatment well    Behavior During Therapy [facility] for tasks assessed/performed             Past Medical History:  Diagnosis Date   Bronchitis    Diabetes mellitus without complication (HCC)    GERD (gastroesophageal reflux disease)    History of 2019 novel coronavirus disease (COVID-19) 05/2019   Hypertension    Past Surgical History:  Procedure Laterality Date   CATARACT EXTRACTION, BILATERAL  2018, 2019   WISDOM TOOTH EXTRACTION  05/2019   Patient Active Problem List   Diagnosis Date Noted   Dental caries on pit and fissure surface penetrating into pulp 01/26/2023   Dental caries on smooth surface penetrating into dentin 01/26/2023   Muscle weakness (generalized) 01/26/2023   Diabetes mellitus type 2, controlled, without complications (HCC) 08/20/2022   Exposure to potentially hazardous substance 08/20/2022   Major depressive disorder 08/20/2022   Migraine 08/20/2022   Nightmare 08/20/2022   Onychomycosis due to dermatophyte 08/20/2022   Open angle with borderline findings, high risk, right eye 08/20/2022   Pain of left hip joint 08/20/2022   Problem related to unspecified psychosocial circumstances 08/20/2022   Vertigo 08/20/2022   Allergic rhinitis 12/19/2021   Anemia 12/19/2021   Anxiety 12/19/2021   COVID-19 12/19/2021   Disorder of refraction and accommodation 12/19/2021   Enthesopathy of ankle and tarsus 12/19/2021   Erectile dysfunction 12/19/2021   Hyperlipidemia 12/19/2021   Legal blindness, as defined in [place] 12/19/2021   Low back pain 12/19/2021   Major depressive disorder, recurrent, moderate (HCC) 12/19/2021   Meniere's disease, bilateral 12/19/2021   Hearing loss in right ear 11/27/2021   History of 2019 novel coronavirus disease (COVID-19) 09/19/2021   Onycholysis 09/19/2021   Peripheral neuropathy 09/19/2021   Plantar fascial fibromatosis 09/19/2021   Sleep apnea 09/19/2021   Abnormal gait 09/19/2021   Cataract 09/19/2021   Bunionette of left foot 09/19/2021   Chronic fatigue syndrome 09/19/2021   Chronic post-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 09/19/2021   Encounter for fitting and adjustment of spectacles and contact lenses 09/19/2021   Sinusitis, acute maxillary 01/08/2021   Diabetes mellitus type 2, uncontrolled, with complications 06/05/2019   Essential hypertension 06/05/2019   Pneumonia due to COVID-19 virus 06/03/2019   Type 2 diabetes mellitus (HCC) 06/03/2019   Fall 06/03/2019   Lumbar radiculopathy 03/03/2018   Diverticulosis of colon 09/09/2012   Hypertension associated with diabetes (HCC) 08/08/2012   GERD (gastroesophageal reflux disease) 08/08/2012    PCP: Georganna Skeans, MD   REFERRING PROVIDER: Gustavus Bryant, NP  REFERRING DIAG: decreased LE strength, increased falls risk  Rationale for Evaluation and Treatment: Rehabilitation  THERAPY DIAG:  Other abnormalities of gait and mobility  Muscle weakness (generalized)  ONSET DATE: chronic  SUBJECTIVE:  SUBJECTIVE STATEMENT: Patient describes a long history of balance issues and decreased mobility due to a number of factors including low back issues, vertigo, meniere's disease and decreased sensation in his feet.  He feels most of his balance issues are related to vertigo above all else.  He also relates a fear of falling and  compromised cardiopulmonary function which limits his functional ability.  PERTINENT HISTORY:  Meniere's, migraines   PAIN:  Are you having pain? No  PRECAUTIONS: Fall  RED FLAGS: None   WEIGHT BEARING RESTRICTIONS: No  FALLS:  Has patient fallen in last 6 months? Yes. Number of falls 8  LIVING ENVIRONMENT: Lives with: lives with their spouse Lives in: House/apartment Stairs: Yes: Internal: 16 steps; can reach both and bilateral but cannot reach both has a stair glide Has following equipment at home: Single point cane  OCCUPATION: retired  PLOF: Independent  PATIENT GOALS: to reduce my fall risk  NEXT MD VISIT: as needed  OBJECTIVE:   DIAGNOSTIC FINDINGS:  none  PATIENT SURVEYS:  FOTO 21(39 predicted)  SCREENING FOR RED FLAGS: negative  MUSCLE LENGTH: deferred  POSTURE: flexed trunk   PALPATION: deferred  LUMBAR ROM:   AROM eval  Flexion   Extension   Right lateral flexion   Left lateral flexion   Right rotation   Left rotation    (Blank rows = not tested)  LOWER EXTREMITY ROM:   WFL for gait and transfers  Active  Right eval Left eval  Hip flexion    Hip extension    Hip abduction    Hip adduction    Hip internal rotation    Hip external rotation    Knee flexion    Knee extension    Ankle dorsiflexion    Ankle plantarflexion    Ankle inversion    Ankle eversion     (Blank rows = not tested)  LOWER EXTREMITY MMT:  Functional for transfers and short distance ambulation  MMT Right eval Left eval  Hip flexion    Hip extension    Hip abduction    Hip adduction    Hip internal rotation    Hip external rotation    Knee flexion    Knee extension    Ankle dorsiflexion    Ankle plantarflexion    Ankle inversion    Ankle eversion     (Blank rows = not tested)  LUMBAR SPECIAL TESTS:  deferred  FUNCTIONAL TESTS:  5 times sit to stand: Patient unable to attempt due to fear of falling and apprehension/anxiety  (O2 stas 98% and  HR 74 following attempt)   02/15/23 0001  Standardized Balance Assessment  Standardized Balance Assessment Berg Balance Test  Berg Balance Test  Sit to Stand 3  Standing Unsupported 2  Sitting with Back Unsupported but Feet Supported on Floor or Stool 4  Stand to Sit 4  Transfers 3  Standing Unsupported with Eyes Closed 4  Standing Unsupported with Feet Together 2  From Standing, Reach Forward with Outstretched Arm 1  From Standing Position, Pick up Object from Floor 0  From Standing Position, Turn to Look Behind Over each Shoulder 0  Turn 360 Degrees 0  Standing Unsupported, Alternately Place Feet on Step/Stool 0  Standing Unsupported, One Foot in Front 0  Standing on One Leg 0  Total Score 23   GAIT: Distance walked: 35ft x2 Assistive device utilized: Single point cane Level of assistance: Complete Independence Comments: slow cadence  TODAY'S TREATMENT:  DATE: 02/15/23 Eval    PATIENT EDUCATION:  Education details: Discussed eval findings, rehab rationale and POC and patient is in agreement  Person educated: Patient and Spouse Education method: Explanation Education comprehension: verbalized understanding and needs further education  HOME EXERCISE PROGRAM: TBD  ASSESSMENT:  CLINICAL IMPRESSION: Patient is a 77 y.o. male who was seen today for physical therapy evaluation and treatment for decreased balance and functional mobility. Patient is able to transfer sit/stand independently using his UEs as well as ambulate with SPC with slow cadence and step through pattern.  Attempted to assess static balance through BERG but patient unable to complete/tolerate full assessment due to apprehension and exacerbation of vertigo symptoms.  Patient's needs would best be addressed through neuro based approach, identifying any BPPV issues vs Meniere's.  Further  LTG's to be addressed following neuro assessment.  OBJECTIVE IMPAIRMENTS: decreased activity tolerance, decreased balance, decreased endurance, decreased mobility, difficulty walking, dizziness, and impaired sensation.   ACTIVITY LIMITATIONS: carrying, lifting, standing, squatting, stairs, and locomotion level  PERSONAL FACTORS: Behavior pattern, Past/current experiences, Time since onset of injury/illness/exacerbation, and 1 comorbidity: Menieres and neuropathy  are also affecting patient's functional outcome.   REHAB POTENTIAL: Fair due to chronicity of symptoms   CLINICAL DECISION MAKING: Stable/uncomplicated  EVALUATION COMPLEXITY: Low   GOALS: Goals reviewed with patient? No  SHORT TERM GOALS: Target date: 03/08/2023    Patient to demonstrate independence in HEP  Baseline: TBD Goal status: INITIAL  2.  Assess for BPPV dysfunction. Baseline: TBD Goal status: INITIAL   LONG TERM GOALS: Target date: 03/29/2023  569ft gait with LRAD w/o any LOB episodes Baseline: 75 ft with SPC Goal status: INITIAL  2.  Increase BERG score to 40 Baseline: 23 Goal status: INITIAL    PLAN:  PT FREQUENCY: 2x/week  PT DURATION: 6 weeks  PLANNED INTERVENTIONS: Therapeutic exercises, Therapeutic activity, Neuromuscular re-education, Balance training, Gait training, Patient/Family education, Self Care, Joint mobilization, DME instructions, Manual therapy, and Re-evaluation.  PLAN FOR NEXT SESSION: HEP review and update, manual techniques as appropriate, aerobic tasks, ROM and flexibility activities, strengthening and PREs, TPDN, gait and balance training as needed     Hildred Laser, PT 02/15/2023, 9:44 AM

## 2023-02-15 ENCOUNTER — Other Ambulatory Visit: Payer: Self-pay

## 2023-02-15 ENCOUNTER — Other Ambulatory Visit (HOSPITAL_COMMUNITY): Payer: Self-pay

## 2023-02-15 ENCOUNTER — Ambulatory Visit: Payer: No Typology Code available for payment source | Attending: Family Medicine

## 2023-02-15 DIAGNOSIS — R2689 Other abnormalities of gait and mobility: Secondary | ICD-10-CM | POA: Diagnosis present

## 2023-02-15 DIAGNOSIS — M6281 Muscle weakness (generalized): Secondary | ICD-10-CM | POA: Diagnosis present

## 2023-02-17 ENCOUNTER — Other Ambulatory Visit (HOSPITAL_COMMUNITY): Payer: Self-pay

## 2023-02-20 DIAGNOSIS — R1010 Upper abdominal pain, unspecified: Secondary | ICD-10-CM | POA: Diagnosis not present

## 2023-02-20 DIAGNOSIS — K219 Gastro-esophageal reflux disease without esophagitis: Secondary | ICD-10-CM | POA: Diagnosis not present

## 2023-02-20 DIAGNOSIS — R1013 Epigastric pain: Secondary | ICD-10-CM | POA: Diagnosis not present

## 2023-02-20 DIAGNOSIS — R079 Chest pain, unspecified: Secondary | ICD-10-CM | POA: Diagnosis not present

## 2023-03-03 ENCOUNTER — Encounter (HOSPITAL_COMMUNITY): Payer: Self-pay

## 2023-03-03 ENCOUNTER — Ambulatory Visit (HOSPITAL_COMMUNITY)
Admission: RE | Admit: 2023-03-03 | Discharge: 2023-03-03 | Disposition: A | Discharge status: Home / Self Care | Payer: No Typology Code available for payment source | Source: Ambulatory Visit | Attending: Emergency Medicine | Admitting: Emergency Medicine

## 2023-03-03 VITALS — BP 155/72 | HR 74 | Temp 98.7°F | Resp 16 | Ht 68.0 in | Wt 170.0 lb

## 2023-03-03 DIAGNOSIS — J069 Acute upper respiratory infection, unspecified: Secondary | ICD-10-CM

## 2023-03-03 MED ORDER — PROMETHAZINE-DM 6.25-15 MG/5ML PO SYRP: 2.5000 mL | ORAL_SOLUTION | Freq: Every evening | ORAL | 0 refills | Status: DC

## 2023-03-03 MED ORDER — ALBUTEROL SULFATE (2.5 MG/3ML) 0.083% IN NEBU
2.5000 mg | INHALATION_SOLUTION | Freq: Once | RESPIRATORY_TRACT | Status: AC
  Administered 2023-03-03: 2.5 mg via RESPIRATORY_TRACT

## 2023-03-03 MED ORDER — ALBUTEROL SULFATE (2.5 MG/3ML) 0.083% IN NEBU
INHALATION_SOLUTION | RESPIRATORY_TRACT | Status: AC
  Filled 2023-03-03: qty 3

## 2023-03-03 MED ORDER — AZITHROMYCIN 200 MG/5ML PO SUSR: 250.0000 mg | ORAL | 0 refills | Status: DC

## 2023-03-03 NOTE — ED Triage Notes (Signed)
Patient here today with c/o SOB, productive cough, and nasal congestion X 1 week. Patient states that this has happened in the past and has gotten a breathing treatment and a Z pak. He went to [facility].

## 2023-03-03 NOTE — ED Provider Notes (Signed)
[facility]    CSN: [id number] Arrival date & time: 03/03/23  1654     History   Chief Complaint Chief Complaint  Patient presents with   Shortness of Breath    HPI Ethan Pierce is a 77 y.o. male.  Here with 1 week history of productive cough, nasal congestion, shortness of breath. Not having fever, headache, dizziness, weakness, wheezing, chest pain, abd pain, NVD No known sick contacts, no recent travel Using his flonase which helps congestion  Reports history of chronic bronchitis and uses albuterol inhaler morning and night with some help. When he starts to feel like this he usually gets a breathing treatment. In the past has used azithromycin; last was November   Past Medical History:  Diagnosis Date   Bronchitis    Diabetes mellitus without complication (HCC)    GERD (gastroesophageal reflux disease)    History of 2019 novel coronavirus disease (COVID-19) 05/2019   Hypertension     Patient Active Problem List   Diagnosis Date Noted   Dental caries on pit and fissure surface penetrating into pulp 01/26/2023   Dental caries on smooth surface penetrating into dentin 01/26/2023   Muscle weakness (generalized) 01/26/2023   Diabetes mellitus type 2, controlled, without complications (HCC) 08/20/2022   Exposure to potentially hazardous substance 08/20/2022   Major depressive disorder 08/20/2022   Migraine 08/20/2022   Nightmare 08/20/2022   Onychomycosis due to dermatophyte 08/20/2022   Open angle with borderline findings, high risk, right eye 08/20/2022   Pain of left hip joint 08/20/2022   Problem related to unspecified psychosocial circumstances 08/20/2022   Vertigo 08/20/2022   Allergic rhinitis 12/19/2021   Anemia 12/19/2021   Anxiety 12/19/2021   COVID-19 12/19/2021   Disorder of refraction and accommodation 12/19/2021   Enthesopathy of ankle and tarsus 12/19/2021   Erectile dysfunction 12/19/2021   Hyperlipidemia 12/19/2021   Legal  blindness, as defined in [place] 12/19/2021   Low back pain 12/19/2021   Major depressive disorder, recurrent, moderate (HCC) 12/19/2021   Meniere's disease, bilateral 12/19/2021   Hearing loss in right ear 11/27/2021   History of 2019 novel coronavirus disease (COVID-19) 09/19/2021   Onycholysis 09/19/2021   Peripheral neuropathy 09/19/2021   Plantar fascial fibromatosis 09/19/2021   Sleep apnea 09/19/2021   Abnormal gait 09/19/2021   Cataract 09/19/2021   Bunionette of left foot 09/19/2021   Chronic fatigue syndrome 09/19/2021   Chronic post-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 09/19/2021   Encounter for fitting and adjustment of spectacles and contact lenses 09/19/2021   Sinusitis, acute maxillary 01/08/2021   Diabetes mellitus type 2, uncontrolled, with complications 06/05/2019   Essential hypertension 06/05/2019   Pneumonia due to COVID-19 virus 06/03/2019   Type 2 diabetes mellitus (HCC) 06/03/2019   Fall 06/03/2019   Lumbar radiculopathy 03/03/2018   Diverticulosis of colon 09/09/2012   Hypertension associated with diabetes (HCC) 08/08/2012   GERD (gastroesophageal reflux disease) 08/08/2012    Past Surgical History:  Procedure Laterality Date   CATARACT EXTRACTION, BILATERAL  2018, 2019   WISDOM TOOTH EXTRACTION  05/2019       Home Medications    Prior to Admission medications   Medication Sig Start Date End Date Taking? Authorizing Provider  amLODipine (NORVASC) 10 MG tablet Take 1 tablet by mouth once a day in the morning for blood pressure. 08/18/21  Yes   azithromycin (ZITHROMAX) 200 MG/5ML suspension Take 6.3 mLs (250 mg total) by mouth as directed. Take 12 mL once on day one.  Then take 6 mL once daily on days 2-5 03/03/23  Yes Mahalia Dykes, Lurena Joiner, PA-C  fluticasone [facility]) 50 MCG/ACT nasal spray 1 spray in each nostril 09/03/20  Yes [provider]  metoprolol tartrate (LOPRESSOR) 25 MG tablet Take 1 tablet (25 mg) by mouth 2 times daily. 01/26/23  Yes  Georganna Skeans, MD  promethazine-dextromethorphan (PROMETHAZINE-DM) 6.25-15 MG/5ML syrup Take 2.5 mLs by mouth at bedtime. 03/03/23  Yes Myangel Summons, Lurena Joiner, PA-C  valsartan-hydrochlorothiazide (DIOVAN-HCT) 160-12.5 MG tablet Take 1 tablet by mouth daily for blood pressure 01/26/23  Yes Georganna Skeans, MD  albuterol (VENTOLIN HFA) 108 (90 Base) MCG/ACT inhaler Inhale 2 puffs into the lungs every 6 (six) hours as needed for wheezing or shortness of breath. 06/05/22   Raspet, Noberto Retort, PA-C    Family History Family History  Problem Relation Age of Onset   Diabetes Mother    GER disease Mother    Cancer Father        pancreas   Diabetes Sister    Diabetes Brother     Social History Social History   Tobacco Use   Smoking status: Never   Smokeless tobacco: Never  Vaping Use   Vaping status: Never Used  Substance Use Topics   Alcohol use: No   Drug use: No     Allergies   Penicillin g, Penicillins, Trazodone and nefazodone, Ativan [lorazepam], Atorvastatin, Decongestant [pseudoephedrine hcl er], Flunisolide, Lactose, Lisinopril, and Talwin [pentazocine]   Review of Systems Review of Systems As per HPI  Physical Exam Triage Vital Signs ED Triage Vitals  Encounter Vitals Group     BP 03/03/23 1745 (!) 155/72     Systolic BP Percentile --      Diastolic BP Percentile --      Pulse Rate 03/03/23 1745 74     Resp 03/03/23 1745 16     Temp 03/03/23 1745 98.7 F (37.1 C)     Temp Source 03/03/23 1745 Oral     SpO2 03/03/23 1745 98 %     Weight 03/03/23 1743 170 lb (77.1 kg)     Height 03/03/23 1743 5\' 8"  (1.727 m)     Head Circumference --      Peak Flow --      Pain Score 03/03/23 1743 0     Pain Loc --      Pain Education --      Exclude from Growth Chart --    No data found.  Updated Vital Signs BP (!) 155/72 (BP Location: Right Arm)   Pulse 74   Temp 98.7 F (37.1 C) (Oral)   Resp 16   Ht 5\' 8"  (1.727 m)   Wt 170 lb (77.1 kg)   SpO2 98%   BMI 25.85 kg/m     Physical Exam Vitals and nursing note reviewed.  Constitutional:      General: He is not in acute distress.    Appearance: He is not ill-appearing.     Comments: Well appearing, no acute distress, speaks in full sentences   HENT:     Nose: Congestion present. No rhinorrhea.     Mouth/Throat:     Mouth: Mucous membranes are moist.     Pharynx: Oropharynx is clear. No posterior oropharyngeal erythema.  Eyes:     Conjunctiva/sclera: Conjunctivae normal.  Cardiovascular:     Rate and Rhythm: Normal rate and regular rhythm.     Pulses: Normal pulses.     Heart sounds: Normal heart sounds.  Pulmonary:  Effort: Pulmonary effort is normal.     Breath sounds: Normal breath sounds.     Comments: Lungs are clear throughout  Musculoskeletal:     Cervical back: Normal range of motion.  Lymphadenopathy:     Cervical: No cervical adenopathy.  Skin:    General: Skin is warm and dry.  Neurological:     Mental Status: He is alert and oriented to person, place, and time.     UC Treatments / Results  Labs (all labs ordered are listed, but only abnormal results are displayed) Labs Reviewed - No data to display  EKG  Radiology No results found.  Procedures Procedures (including critical care time)  Medications Ordered in UC Medications  albuterol (PROVENTIL) (2.5 MG/3ML) 0.083% nebulizer solution 2.5 mg (2.5 mg Nebulization Given 03/03/23 1819)    Initial Impression / Assessment and Plan / UC Course  I have reviewed the triage vital signs and the nursing notes.  Pertinent labs & imaging results that were available during my care of the patient were reviewed by me and considered in my medical decision making (see chart for details).  Patient is well appearing. Afebrile, no acute distress, sating 98% on room air. Lungs are clear throughout. No indication for xray imaging at this time  He is requesting an albuterol nebulizer in clinic which always makes him feel better. Given  and patient reports feeling good. We discussed symptomatic care at home. Will use small dose promethazine DM just before bedtime. Understands drowsy precautions. Continue albuterol and nasal spray.   He is requesting prescription for azithromycin to use if he doesn't feel better. Has trouble swallowing pills, liquid requested. Delayed paper prescription sent to use if no improvement or worsening in the next 4 days.  Return and ED precautions discussed. Patient agrees to plan, no questions at this time  Final Clinical Impressions(s) / UC Diagnoses   Final diagnoses:  Viral URI with cough     Discharge Instructions      Continue using your albuterol inhaler  The cough syrup can be used up to 4 times daily. However it may make you drowsy. I recommend to take one dose just before bedtime.  Continue the nasal spray for congestion  If no improvement by Sunday (4 days) you can fill the paper prescription for the azithromycin antibiotic      ED Prescriptions     Medication Sig Dispense Auth. Provider   promethazine-dextromethorphan (PROMETHAZINE-DM) 6.25-15 MG/5ML syrup Take 2.5 mLs by mouth at bedtime. 118 mL Samel Bruna, PA-C   azithromycin (ZITHROMAX) 200 MG/5ML suspension Take 6.3 mLs (250 mg total) by mouth as directed. Take 12 mL once on day one. Then take 6 mL once daily on days 2-5 36 mL Arville Postlewaite, Lurena Joiner, PA-C      PDMP not reviewed this encounter.   Soraiya Ahner, Ray Church 03/03/23 2031

## 2023-03-03 NOTE — Discharge Instructions (Addendum)
Continue using your albuterol inhaler  The cough syrup can be used up to 4 times daily. However it may make you drowsy. I recommend to take one dose just before bedtime.  Continue the nasal spray for congestion  If no improvement by Sunday (4 days) you can fill the paper prescription for the azithromycin antibiotic

## 2023-03-05 ENCOUNTER — Other Ambulatory Visit (HOSPITAL_COMMUNITY): Payer: Self-pay

## 2023-03-05 ENCOUNTER — Other Ambulatory Visit: Payer: Self-pay

## 2023-03-05 ENCOUNTER — Ambulatory Visit (INDEPENDENT_AMBULATORY_CARE_PROVIDER_SITE_OTHER): Payer: No Typology Code available for payment source | Admitting: Podiatry

## 2023-03-05 DIAGNOSIS — B351 Tinea unguium: Secondary | ICD-10-CM

## 2023-03-05 MED ORDER — CIPROFLOXACIN HCL 500 MG PO TABS
500.0000 mg | ORAL_TABLET | Freq: Two times a day (BID) | ORAL | 0 refills | Status: DC
  Filled 2023-03-05: qty 20, 10d supply, fill #0

## 2023-03-05 MED ORDER — CICLOPIROX 8 % EX SOLN
Freq: Every day | CUTANEOUS | 0 refills | Status: DC
  Filled 2023-03-05: qty 6.6, 30d supply, fill #0
  Filled 2023-03-15: qty 6.6, 7d supply, fill #0

## 2023-03-05 NOTE — Progress Notes (Signed)
Subjective:  Patient ID: Ethan Pierce, male    DOB: 1946/03/27,  MRN: [id number]  Chief Complaint  Patient presents with   Nail Problem    77 y.o. male presents with the above complaint.  Patient presents with thickened underlying dystrophic mycotic toenails x 10 mild pain on palpation he would like to discuss treatment options for it.  He does not want oral medication.  He wanted to discuss topical application.  He has not tried ciclopirox   Review of Systems: Negative except as noted in the HPI. Denies N/V/F/Ch.  Past Medical History:  Diagnosis Date   Bronchitis    Diabetes mellitus without complication (HCC)    GERD (gastroesophageal reflux disease)    History of 2019 novel coronavirus disease (COVID-19) 05/2019   Hypertension     Current Outpatient Medications:    ciprofloxacin (CIPRO) 500 MG tablet, Take 1 tablet (500 mg total) by mouth 2 (two) times daily for 10 days., Disp: 20 tablet, Rfl: 0   albuterol (VENTOLIN HFA) 108 (90 Base) MCG/ACT inhaler, Inhale 2 puffs into the lungs every 6 (six) hours as needed for wheezing or shortness of breath., Disp: 6.7 g, Rfl: 0   amLODipine (NORVASC) 10 MG tablet, Take 1 tablet by mouth once a day in the morning for blood pressure., Disp: 90 tablet, Rfl: 3   azithromycin (ZITHROMAX) 200 MG/5ML suspension, Take 6.3 mLs (250 mg total) by mouth as directed. Take 12 mL once on day one. Then take 6 mL once daily on days 2-5, Disp: 36 mL, Rfl: 0   fluticasone (FLONASE) 50 MCG/ACT nasal spray, 1 spray in each nostril, Disp: , Rfl:    metoprolol tartrate (LOPRESSOR) 25 MG tablet, Take 1 tablet (25 mg) by mouth 2 times daily., Disp: 180 tablet, Rfl: 1   promethazine-dextromethorphan (PROMETHAZINE-DM) 6.25-15 MG/5ML syrup, Take 2.5 mLs by mouth at bedtime., Disp: 118 mL, Rfl: 0   valsartan-hydrochlorothiazide (DIOVAN-HCT) 160-12.5 MG tablet, Take 1 tablet by mouth daily for blood pressure, Disp: 90 tablet, Rfl: 1  Social History   Tobacco Use   Smoking Status Never  Smokeless Tobacco Never    Allergies  Allergen Reactions   Penicillin G Anaphylaxis   Penicillins Anaphylaxis    Did it involve swelling of the face/tongue/throat, SOB, or low BP? Yes Did it involve sudden or severe rash/hives, skin peeling, or any reaction on the inside of your mouth or nose? No Did you need to seek medical attention at a hospital or doctor's office? No When did it last happen?       If all above answers are "NO", may proceed with cephalosporin use.    Trazodone And Nefazodone Other (See Comments)    Confusion, agitation, and hyperalert   Ativan [Lorazepam] Other (See Comments)    agitation    Atorvastatin     Other reaction(s): myalgia   Decongestant [Pseudoephedrine Hcl Er] Other (See Comments)    Elevated blood pressure    Flunisolide    Lactose    Lisinopril     Other reaction(s): cough   Talwin [Pentazocine] Other (See Comments)    Elevated blood pressure.   Objective:  There were no vitals filed for this visit. There is no height or weight on file to calculate BMI. Constitutional Well developed. Well nourished.  Vascular Dorsalis pedis pulses palpable bilaterally. Posterior tibial pulses palpable bilaterally. Capillary refill normal to all digits.  No cyanosis or clubbing noted. Pedal hair growth normal.  Neurologic Normal speech. Oriented to person, place,  and time. Epicritic sensation to light touch grossly present bilaterally.  Dermatologic Nails thickened dystrophic mycotic toenails x 10 mild pain on palpation Skin within normal limits  Orthopedic: Normal joint ROM without pain or crepitus bilaterally. No visible deformities. No bony tenderness.   Radiographs: None Assessment:   1. Nail fungus   2. Onychomycosis due to dermatophyte    Plan:  Patient was evaluated and treated and all questions answered.  Toenails x 10 -Educated the patient on the etiology of onychomycosis and various treatment options  associated with improving the fungal load.  I explained to the patient that there is 3 treatment options available to treat the onychomycosis including topical, p.o., laser treatment.  Patient elected to topical medication with ciclopirox.  Advised him apply twice a day.  He states understanding   No follow-ups on file.

## 2023-03-05 NOTE — Addendum Note (Signed)
Addended by: Myriam Jacobson on: 03/05/2023 08:31 AM   Modules accepted: Orders

## 2023-03-08 ENCOUNTER — Ambulatory Visit: Payer: No Typology Code available for payment source | Attending: Family Medicine | Admitting: Physical Therapy

## 2023-03-08 DIAGNOSIS — R2681 Unsteadiness on feet: Secondary | ICD-10-CM | POA: Insufficient documentation

## 2023-03-08 DIAGNOSIS — R2689 Other abnormalities of gait and mobility: Secondary | ICD-10-CM | POA: Insufficient documentation

## 2023-03-08 DIAGNOSIS — R42 Dizziness and giddiness: Secondary | ICD-10-CM | POA: Insufficient documentation

## 2023-03-08 DIAGNOSIS — M6281 Muscle weakness (generalized): Secondary | ICD-10-CM | POA: Insufficient documentation

## 2023-03-10 ENCOUNTER — Other Ambulatory Visit: Payer: Self-pay

## 2023-03-10 ENCOUNTER — Other Ambulatory Visit (HOSPITAL_COMMUNITY): Payer: Self-pay

## 2023-03-13 ENCOUNTER — Other Ambulatory Visit (HOSPITAL_COMMUNITY): Payer: Self-pay

## 2023-03-15 ENCOUNTER — Other Ambulatory Visit (HOSPITAL_COMMUNITY): Payer: Self-pay

## 2023-03-15 MED ORDER — AZITHROMYCIN 200 MG/5ML PO SUSR
ORAL | 0 refills | Status: DC
  Filled 2023-03-15: qty 60, 5d supply, fill #0

## 2023-03-18 ENCOUNTER — Ambulatory Visit: Payer: No Typology Code available for payment source | Admitting: Physical Therapy

## 2023-03-18 ENCOUNTER — Encounter: Payer: Self-pay | Admitting: Physical Therapy

## 2023-03-18 VITALS — BP 148/61 | HR 83

## 2023-03-18 DIAGNOSIS — R2689 Other abnormalities of gait and mobility: Secondary | ICD-10-CM | POA: Diagnosis present

## 2023-03-18 DIAGNOSIS — R2681 Unsteadiness on feet: Secondary | ICD-10-CM | POA: Diagnosis present

## 2023-03-18 DIAGNOSIS — M6281 Muscle weakness (generalized): Secondary | ICD-10-CM

## 2023-03-18 DIAGNOSIS — R42 Dizziness and giddiness: Secondary | ICD-10-CM | POA: Diagnosis present

## 2023-03-18 NOTE — Therapy (Signed)
OUTPATIENT PHYSICAL THERAPY VESTIBULAR EVALUATION/RE-CERT   Patient Name: Ethan Pierce MRN: [id number] DOB:03-20-1946, 77 y.o., male Today's Date: 03/18/2023  END OF SESSION:  PT End of Session - 03/18/23 0859     Visit Number 2    Number of Visits 15    Date for PT Re-Evaluation 05/17/23   per re-cert on 11/10/79   Authorization Type [facility] - 15 visits  Dates 12/04/2022- 04/03/2023    PT Start Time 0858   pt late to session   PT Stop Time 0930    PT Time Calculation (min) 32 min    Activity Tolerance Patient tolerated treatment well    Behavior During Therapy [facility] for tasks assessed/performed             Past Medical History:  Diagnosis Date   Bronchitis    Diabetes mellitus without complication (HCC)    GERD (gastroesophageal reflux disease)    History of 2019 novel coronavirus disease (COVID-19) 05/2019   Hypertension    Past Surgical History:  Procedure Laterality Date   CATARACT EXTRACTION, BILATERAL  2018, 2019   WISDOM TOOTH EXTRACTION  05/2019   Patient Active Problem List   Diagnosis Date Noted   Dental caries on pit and fissure surface penetrating into pulp 01/26/2023   Dental caries on smooth surface penetrating into dentin 01/26/2023   Muscle weakness (generalized) 01/26/2023   Diabetes mellitus type 2, controlled, without complications (HCC) 08/20/2022   Exposure to potentially hazardous substance 08/20/2022   Major depressive disorder 08/20/2022   Migraine 08/20/2022   Nightmare 08/20/2022   Onychomycosis due to dermatophyte 08/20/2022   Open angle with borderline findings, high risk, right eye 08/20/2022   Pain of left hip joint 08/20/2022   Problem related to unspecified psychosocial circumstances 08/20/2022   Vertigo 08/20/2022   Allergic rhinitis 12/19/2021   Anemia 12/19/2021   Anxiety 12/19/2021   COVID-19 12/19/2021   Disorder of refraction and accommodation 12/19/2021   Enthesopathy of ankle and tarsus 12/19/2021   Erectile dysfunction  12/19/2021   Hyperlipidemia 12/19/2021   Legal blindness, as defined in [place] 12/19/2021   Low back pain 12/19/2021   Major depressive disorder, recurrent, moderate (HCC) 12/19/2021   Meniere's disease, bilateral 12/19/2021   Hearing loss in right ear 11/27/2021   History of 2019 novel coronavirus disease (COVID-19) 09/19/2021   Onycholysis 09/19/2021   Peripheral neuropathy 09/19/2021   Plantar fascial fibromatosis 09/19/2021   Sleep apnea 09/19/2021   Abnormal gait 09/19/2021   Cataract 09/19/2021   Bunionette of left foot 09/19/2021   Chronic fatigue syndrome 09/19/2021   Chronic post-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 09/19/2021   Encounter for fitting and adjustment of spectacles and contact lenses 09/19/2021   Sinusitis, acute maxillary 01/08/2021   Diabetes mellitus type 2, uncontrolled, with complications 06/05/2019   Essential hypertension 06/05/2019   Pneumonia due to COVID-19 virus 06/03/2019   Type 2 diabetes mellitus (HCC) 06/03/2019   Fall 06/03/2019   Lumbar radiculopathy 03/03/2018   Diverticulosis of colon 09/09/2012   Hypertension associated with diabetes (HCC) 08/08/2012   GERD (gastroesophageal reflux disease) 08/08/2012    PCP: Georganna Skeans, MD   REFERRING PROVIDER: Gustavus Bryant, NP  REFERRING DIAG: decreased LE strength, increased falls risk  Rationale for Evaluation and Treatment: Rehabilitation  THERAPY DIAG:  Other abnormalities of gait and mobility  Muscle weakness (generalized)  Dizziness and giddiness  Unsteadiness on feet  ONSET DATE: chronic  SUBJECTIVE:  SUBJECTIVE STATEMENT:  Has a history of Meniere's disease. When he starts to feel getting dizzy, will have to get in a quiet area and be still. Reports that he feels like his balance issues  come from the vertigo. He will get nauseated and wobbly and then will fall. Will sometimes feel lightheadedness. Larey Seat backwards once in the garage. Taking Meclizine as needed. Since taking Meclizine has not been falling as much. Normally uses a cane, but can't find it  PERTINENT HISTORY:  PMH: Meniere's disease, right hearing loss, HTN, Type II Diabetes, peripheral neuropathy, anxiety, HLD    PAIN:  Are you having pain? No  Vitals:   03/18/23 0909  BP: (!) 148/61  Pulse: 83     PRECAUTIONS: Fall  RED FLAGS: None   WEIGHT BEARING RESTRICTIONS: No  FALLS:  Has patient fallen in last 6 months? Yes. Number of falls 1  LIVING ENVIRONMENT: Lives with: lives with their spouse Lives in: House/apartment Stairs: Yes: Internal: 16 steps; can reach both and bilateral but cannot reach both has a stair glide Has following equipment at home: Single point cane  OCCUPATION: retired  PLOF: Independent  PATIENT GOALS: Wants recommendations on what to do  NEXT MD VISIT: as needed  OBJECTIVE:   GAIT: Distance walked: clinic distances  Assistive device utilized: None Level of assistance: Complete Independence Comments: slow cadence, pt reports will use a cane but does not have one today   TODAY'S TREATMENT:                                                                                                                              DATE: 03/18/23   VESTIBULAR ASSESSMENT   GENERAL OBSERVATION: Ambulates in with no AD     SYMPTOM BEHAVIOR:   Subjective history: Reports having a TBI in the military as well as Meniere's disease, has had dizziness for a long time.    Non-Vestibular symptoms: neck pain, headaches, tinnitus, and nausea/vomiting   Type of dizziness:  "feels like he has been drinking all night"    Frequency: Doesn't happen everyday, depends on his Meniere's    Duration: Depends, could last 30 minutes or 3-4 hours    Aggravating factors: Spontaneous   Relieving factors:  lying supine, dark room, and taking Meclizine as needed    Progression of symptoms: "just depends", after time over the years, just learn to deal with it   OCULOMOTOR EXAM:   Ocular Alignment: normal   Ocular ROM: No Limitations   Spontaneous Nystagmus: absent   Gaze-Induced Nystagmus: absent   Smooth Pursuits: intact and mild dizziness    Saccades: intact and mild dizziness, more symptoms with vertical direction      VESTIBULAR - OCULAR REFLEX:    Slow VOR: Normal and Comment: mild dizziness, difficult keeping focus with eyes    VOR Cancellation: Comment: moderate dizziness, pt with one instance of unable to keep R eye focused    Head-Impulse Test:  HIT Right: negative HIT Left: positive, very delayed Pt blinked going to the R, so unable to accurately assess. Moderate/Severe dizziness    Dynamic Visual Acuity: will assess at a future date   PATIENT EDUCATION:  Education details: Clinical findings, POC, areas to work on in vestibular therapy  Person educated: Patient Education method: Explanation Education comprehension: verbalized understanding and needs further education  HOME EXERCISE PROGRAM: TBD  ASSESSMENT:  CLINICAL IMPRESSION: Pt transferred from [facility] ortho clinic to outpatient neuro rehab for balance/vestibular therapy due to hx of Menieres disease. Pt also reports a hx of a TBI from being in the [employer]. Pt has had a hx of falls due to feeling dizzy. Performed further vestibular assessment today. Pt arrived late, so full assessment was limited and will have to finish at next session. Pt with impairments in slow VOR, VOR cancellation, and positive HIT to the L (pt very delayed). Pt with normal oculomotor exam, but pt did report incr dizziness with saccades and smooth pursuits. Pt will benefit from vestibular PT to address deficits, dizziness, imbalance, and decr pt's risk of falls. STGs/LTGs updated as appropriate with re-cert sent.   OBJECTIVE IMPAIRMENTS:  decreased activity tolerance, decreased balance, decreased endurance, decreased mobility, difficulty walking, decreased strength, dizziness, and impaired sensation.   ACTIVITY LIMITATIONS: carrying, lifting, standing, squatting, stairs, and locomotion level  PERSONAL FACTORS: Behavior pattern, Past/current experiences, Time since onset of injury/illness/exacerbation, and 1 comorbidity: Meniere's disease, right hearing loss, HTN, Type II Diabetes, peripheral neuropathy, anxiety, HLD    are also affecting patient's functional outcome.   REHAB POTENTIAL: Fair due to chronicity of symptoms   CLINICAL DECISION MAKING: Stable/uncomplicated  EVALUATION COMPLEXITY: Low   GOALS: Goals reviewed with patient? No  SHORT TERM GOALS: Target date: 04/15/2023   Patient will be independent with initial HEP for vestibular symptoms  Baseline:  Goal status: INITIAL  2.  MSQ to be assessed with LTG written.  Baseline: TBD Goal status: INITIAL  3.  mCTSIB to be assessed with LTG written.  Baseline: TBD Goal status: INITIAL  4.  DVA to be assessed with LTG written.  Baseline: TBD Goal status: INITIAL  LONG TERM GOALS: Target date: 05/13/2023  Patient will be independent with final HEP for vestibular symptoms  Baseline:  Goal status: INITIAL  2.  MSQ goal to be written  Baseline: TBD Goal status: INITIAL  3.  mCTSIB goal to be written  Baseline: TBD Goal status: INITIAL  4.  DVA goal to be written  Baseline: TBD Goal status: INITIAL   PLAN:  PT FREQUENCY: 2x/week  PT DURATION: 8 weeks  PLANNED INTERVENTIONS: Therapeutic exercises, Therapeutic activity, Neuromuscular re-education, Balance training, Gait training, Patient/Family education, Self Care, Joint mobilization, Vestibular training, Canalith repositioning, DME instructions, Manual therapy, and Re-evaluation.  PLAN FOR NEXT SESSION: finish vestibular assessment with MSQ, mCTSIB, DVA and update goals as needed. Initiate HEP  based on deficits and VOR, saccades, smooth pursuits    Drake Leach, PT, DPT 03/18/2023, 9:35 AM

## 2023-04-06 ENCOUNTER — Ambulatory Visit: Payer: No Typology Code available for payment source | Attending: Family Medicine

## 2023-04-06 VITALS — BP 140/65

## 2023-04-06 DIAGNOSIS — M6281 Muscle weakness (generalized): Secondary | ICD-10-CM | POA: Insufficient documentation

## 2023-04-06 DIAGNOSIS — R2689 Other abnormalities of gait and mobility: Secondary | ICD-10-CM | POA: Insufficient documentation

## 2023-04-06 DIAGNOSIS — R42 Dizziness and giddiness: Secondary | ICD-10-CM | POA: Insufficient documentation

## 2023-04-06 DIAGNOSIS — R2681 Unsteadiness on feet: Secondary | ICD-10-CM | POA: Diagnosis present

## 2023-04-06 NOTE — Therapy (Signed)
OUTPATIENT PHYSICAL THERAPY VESTIBULAR EVALUATION/RE-CERT   Patient Name: Ethan Pierce MRN: [id number] DOB:[birth year], 77 y.o., male Today's Date: 04/06/2023  END OF SESSION:  PT End of Session - 04/06/23 0745     Visit Number 3    Number of Visits 15    Date for PT Re-Evaluation 05/17/23    Authorization Type VA - 15 visits  Dates 12/04/2022- 04/03/2023    PT Start Time 0800    PT Stop Time 0842    PT Time Calculation (min) 42 min    Equipment Utilized During Treatment Gait belt    Activity Tolerance Patient tolerated treatment well    Behavior During Therapy WFL for tasks assessed/performed             Past Medical History:  Diagnosis Date   Bronchitis    Diabetes mellitus without complication (HCC)    GERD (gastroesophageal reflux disease)    History of 2019 novel coronavirus disease (COVID-19) 05/2019   Hypertension    Past Surgical History:  Procedure Laterality Date   CATARACT EXTRACTION, BILATERAL  2018, 2019   WISDOM TOOTH EXTRACTION  05/2019   Patient Active Problem List   Diagnosis Date Noted   Dental caries on pit and fissure surface penetrating into pulp 01/26/2023   Dental caries on smooth surface penetrating into dentin 01/26/2023   Muscle weakness (generalized) 01/26/2023   Diabetes mellitus type 2, controlled, without complications (HCC) 08/20/2022   Exposure to potentially hazardous substance 08/20/2022   Major depressive disorder 08/20/2022   Migraine 08/20/2022   Nightmare 08/20/2022   Onychomycosis due to dermatophyte 08/20/2022   Open angle with borderline findings, high risk, right eye 08/20/2022   Pain of left hip joint 08/20/2022   Problem related to unspecified psychosocial circumstances 08/20/2022   Vertigo 08/20/2022   Allergic rhinitis 12/19/2021   Anemia 12/19/2021   Anxiety 12/19/2021   COVID-19 12/19/2021   Disorder of refraction and accommodation 12/19/2021   Enthesopathy of ankle and tarsus 12/19/2021   Erectile dysfunction  12/19/2021   Hyperlipidemia 12/19/2021   Legal blindness, as defined in [place] 12/19/2021   Low back pain 12/19/2021   Major depressive disorder, recurrent, moderate (HCC) 12/19/2021   Meniere's disease, bilateral 12/19/2021   Hearing loss in right ear 11/27/2021   History of 2019 novel coronavirus disease (COVID-19) 09/19/2021   Onycholysis 09/19/2021   Peripheral neuropathy 09/19/2021   Plantar fascial fibromatosis 09/19/2021   Sleep apnea 09/19/2021   Abnormal gait 09/19/2021   Cataract 09/19/2021   Bunionette of left foot 09/19/2021   Chronic fatigue syndrome 09/19/2021   Chronic post-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 09/19/2021   Encounter for fitting and adjustment of spectacles and contact lenses 09/19/2021   Sinusitis, acute maxillary 01/08/2021   Diabetes mellitus type 2, uncontrolled, with complications 06/05/2019   Essential hypertension 06/05/2019   Pneumonia due to COVID-19 virus 06/03/2019   Type 2 diabetes mellitus (HCC) 06/03/2019   Fall 06/03/2019   Lumbar radiculopathy 03/03/2018   Diverticulosis of colon 09/09/2012   Hypertension associated with diabetes (HCC) 08/08/2012   GERD (gastroesophageal reflux disease) 08/08/2012    PCP: Georganna Skeans, MD   REFERRING PROVIDER: Gustavus Bryant, NP  REFERRING DIAG: decreased LE strength, increased falls risk  Rationale for Evaluation and Treatment: Rehabilitation  THERAPY DIAG:  Other abnormalities of gait and mobility  Muscle weakness (generalized)  Unsteadiness on feet  Dizziness and giddiness  ONSET DATE: chronic  SUBJECTIVE:  SUBJECTIVE STATEMENT: Patient arrives to clinic with rollator. Reports fall this past weekend. Denies dizziness at time of fall. Larey Seat toward his R. Minor scrapes to B knees and hands. Denies  head injury.   PERTINENT HISTORY:  PMH: Meniere's disease, right hearing loss, HTN, Type II Diabetes, peripheral neuropathy, anxiety, HLD    PAIN:  Are you having pain? Yes: NPRS scale: 4-5/10 Pain location: L hip  Vitals:   04/06/23 0806  BP: (!) 140/65    PRECAUTIONS: Fall  PATIENT GOALS: Wants recommendations on what to do   TODAY'S TREATMENT:                                                                                                                              VESTIBULAR ASSESSMENT  VESTIBULAR - OCULAR REFLEX:    Slow VOR: Normal and Comment: mild dizziness, difficult keeping focus with eyes    VOR Cancellation: Comment: moderate dizziness, pt with one instance of unable to keep R eye focused    Head-Impulse Test: HIT Right: negative HIT Left: positive, very delayed Pt blinked going to the R, so unable to accurately assess. Moderate/Severe dizziness    Dynamic Visual Acuity: Static: line 7; dynamic: line 3  Motion Sensitivity Quotient  Intensity: 0 = none, 1 = Lightheaded, 2 = Mild, 3 = Moderate, 4 = Severe, 5 = Vomiting  Intensity  1. Sitting to supine 0  2. Supine to L side 3  3. Supine to R side 4  4. Supine to sitting 2  5. L Hallpike-Dix   6. Up from L    7. R Hallpike-Dix   8. Up from R    9. Sitting, head  tipped to L knee Patient declined  10. Head up from L  knee Patient declined  11. Sitting, head  tipped to R knee Patient declined  12. Head up from R  knee Patient declined  13. Sitting head turns x5 2  14.Sitting head nods x5 1  15. In stance, 180  turn to L  1  16. In stance, 180  turn to R 2   Modified MCSTIB: Condition 1: 9s  Condition 2: 4s Condition 3: 0 Condition 4: 0  PATIENT EDUCATION:  Education details: exam findings, initial HEP Person educated: Patient Education method: Explanation Education comprehension: verbalized understanding and needs further education  HOME EXERCISE PROGRAM: Gaze Stabilization:  Sitting    Keeping eyes on target on wall 5 feet away, tilt head down 15-30 and move head side to side for __30__ seconds. Repeat while moving head up and down for __30__ seconds. Do __3__ sessions per day.  Rolling    With pillow under head, start on back. Roll slowly to right. Hold position until symptoms subside. Roll slowly onto left side. Hold position until symptoms subside. Repeat sequence __3__ times per session. Do __3__ sessions per day.  Balance: Eyes Open - Bilateral (Varied Surfaces)    Stand, feet shoulder width,  eyes open. Maintain balance __30__ seconds. Repeat __3__ times per set. Do ___2_ sets per session. Do _7___ sessions per week. Repeat with eyes closed.   Must have chair in front of you- stand in a corner.  ASSESSMENT:  CLINICAL IMPRESSION: Patient seen for skilled PT session with emphasis on vestibular exam and initiating HEP. Patient noted to have mild/moderate motion sensitivity- likely due to learned nonuse. Patient with profound vestibular deficits noted on MCTSIB, consistent with dx. Provided HEP to address deficits. Continue POC.   OBJECTIVE IMPAIRMENTS: decreased activity tolerance, decreased balance, decreased endurance, decreased mobility, difficulty walking, decreased strength, dizziness, and impaired sensation.   ACTIVITY LIMITATIONS: carrying, lifting, standing, squatting, stairs, and locomotion level  PERSONAL FACTORS: Behavior pattern, Past/current experiences, Time since onset of injury/illness/exacerbation, and 1 comorbidity: Meniere's disease, right hearing loss, HTN, Type II Diabetes, peripheral neuropathy, anxiety, HLD    are also affecting patient's functional outcome.   REHAB POTENTIAL: Fair due to chronicity of symptoms   CLINICAL DECISION MAKING: Stable/uncomplicated  EVALUATION COMPLEXITY: Low   GOALS: Goals reviewed with patient? No  SHORT TERM GOALS: Target date: 04/15/2023   Patient will be independent with initial HEP  for vestibular symptoms  Baseline:  Goal status: INITIAL  2.  Pt will report </= 2/5 for all movements on MSQ to indicate improvement in motion sensitivity and improved activity tolerance.   Baseline: up to 4/5 Goal status: INITIAL  3.  Patient will complete >/= 15s on condition 1 of MCTSIB to demonstrate improved static balance Baseline: 9s Goal status: INITIAL  4.  Pt will improve DVA to </= 3 line difference to indicate improved VOR  Baseline: 4 line Goal status: INITIAL  LONG TERM GOALS: Target date: 05/13/2023  Patient will be independent with final HEP for vestibular symptoms  Baseline:  Goal status: INITIAL  2.  Pt will report </= 1/5 for all movements on MSQ to indicate improvement in motion sensitivity and improved activity tolerance.   Baseline: up to 4/5 Goal status: INITIAL  3.  Patient will complete >/=15s of condition 2 on MCTSIB to demonstrate improved static balance Baseline: 4s Goal status: INITIAL  4.  Pt will improve DVA to </= 2 line difference to indicate improved VOR  Baseline: 4 lines Goal status: INITIAL   PLAN:  PT FREQUENCY: 2x/week  PT DURATION: 8 weeks  PLANNED INTERVENTIONS: Therapeutic exercises, Therapeutic activity, Neuromuscular re-education, Balance training, Gait training, Patient/Family education, Self Care, Joint mobilization, Vestibular training, Canalith repositioning, DME instructions, Manual therapy, and Re-evaluation.  PLAN FOR NEXT SESSION:  VOR, saccades, smooth pursuits    Westley Foots, PT Westley Foots, PT, DPT, CBIS  04/06/2023, 8:45 AM

## 2023-04-08 ENCOUNTER — Ambulatory Visit: Payer: No Typology Code available for payment source | Admitting: Physical Therapy

## 2023-04-09 ENCOUNTER — Other Ambulatory Visit (HOSPITAL_COMMUNITY): Payer: Self-pay

## 2023-04-13 ENCOUNTER — Encounter: Payer: No Typology Code available for payment source | Admitting: Physical Therapy

## 2023-04-15 ENCOUNTER — Encounter: Payer: No Typology Code available for payment source | Admitting: Physical Therapy

## 2023-04-15 ENCOUNTER — Other Ambulatory Visit: Payer: Self-pay

## 2023-04-15 NOTE — Progress Notes (Signed)
   Ethan Pierce 03-Jan-1946 [id number]  Patient attempted to be outreached by Thomasene Ripple, PharmD Candidate to discuss hypertension. Left voicemail for patient to return our call at their convenience at [phone].  Thomasene Ripple, Student-PharmD

## 2023-04-20 ENCOUNTER — Ambulatory Visit: Payer: No Typology Code available for payment source | Admitting: Physical Therapy

## 2023-04-21 ENCOUNTER — Encounter: Payer: Self-pay | Admitting: Pharmacist

## 2023-04-22 ENCOUNTER — Ambulatory Visit: Payer: No Typology Code available for payment source | Admitting: Physical Therapy

## 2023-04-22 ENCOUNTER — Encounter: Payer: Self-pay | Admitting: Physical Therapy

## 2023-04-22 VITALS — BP 139/63 | HR 81

## 2023-04-22 DIAGNOSIS — R2689 Other abnormalities of gait and mobility: Secondary | ICD-10-CM

## 2023-04-22 DIAGNOSIS — R42 Dizziness and giddiness: Secondary | ICD-10-CM

## 2023-04-22 DIAGNOSIS — R2681 Unsteadiness on feet: Secondary | ICD-10-CM

## 2023-04-22 NOTE — Therapy (Signed)
OUTPATIENT PHYSICAL THERAPY VESTIBULAR TREATMENT   Patient Name: Ethan Pierce MRN: [id number] DOB:[birth year], 77 y.o., male Today's Date: 04/22/2023  END OF SESSION:  PT End of Session - 04/22/23 0841     Visit Number 4    Number of Visits 15    Date for PT Re-Evaluation 05/17/23    Authorization Type [facility] - 15 visits  Dates 04/12/23 - 08/10/23    Authorization - Visit Number 1    Authorization - Number of Visits 15    PT Start Time 0840    PT Stop Time 0922    PT Time Calculation (min) 42 min    Equipment Utilized During Treatment Gait belt    Activity Tolerance Patient tolerated treatment well    Behavior During Therapy WFL for tasks assessed/performed             Past Medical History:  Diagnosis Date   Bronchitis    Diabetes mellitus without complication (HCC)    GERD (gastroesophageal reflux disease)    History of 2019 novel coronavirus disease (COVID-19) 05/2019   Hypertension    Past Surgical History:  Procedure Laterality Date   CATARACT EXTRACTION, BILATERAL  2018, 2019   WISDOM TOOTH EXTRACTION  05/2019   Patient Active Problem List   Diagnosis Date Noted   Dental caries on pit and fissure surface penetrating into pulp 01/26/2023   Dental caries on smooth surface penetrating into dentin 01/26/2023   Muscle weakness (generalized) 01/26/2023   Diabetes mellitus type 2, controlled, without complications (HCC) 08/20/2022   Exposure to potentially hazardous substance 08/20/2022   Major depressive disorder 08/20/2022   Migraine 08/20/2022   Nightmare 08/20/2022   Onychomycosis due to dermatophyte 08/20/2022   Open angle with borderline findings, high risk, right eye 08/20/2022   Pain of left hip joint 08/20/2022   Problem related to unspecified psychosocial circumstances 08/20/2022   Vertigo 08/20/2022   Allergic rhinitis 12/19/2021   Anemia 12/19/2021   Anxiety 12/19/2021   COVID-19 12/19/2021   Disorder of refraction and accommodation 12/19/2021    Enthesopathy of ankle and tarsus 12/19/2021   Erectile dysfunction 12/19/2021   Hyperlipidemia 12/19/2021   Legal blindness, as defined in [place] 12/19/2021   Low back pain 12/19/2021   Major depressive disorder, recurrent, moderate (HCC) 12/19/2021   Meniere's disease, bilateral 12/19/2021   Hearing loss in right ear 11/27/2021   History of 2019 novel coronavirus disease (COVID-19) 09/19/2021   Onycholysis 09/19/2021   Peripheral neuropathy 09/19/2021   Plantar fascial fibromatosis 09/19/2021   Sleep apnea 09/19/2021   Abnormal gait 09/19/2021   Cataract 09/19/2021   Bunionette of left foot 09/19/2021   Chronic fatigue syndrome 09/19/2021   Chronic post-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 09/19/2021   Encounter for fitting and adjustment of spectacles and contact lenses 09/19/2021   Sinusitis, acute maxillary 01/08/2021   Diabetes mellitus type 2, uncontrolled, with complications 06/05/2019   Essential hypertension 06/05/2019   Pneumonia due to COVID-19 virus 06/03/2019   Type 2 diabetes mellitus (HCC) 06/03/2019   Fall 06/03/2019   Lumbar radiculopathy 03/03/2018   Diverticulosis of colon 09/09/2012   Hypertension associated with diabetes (HCC) 08/08/2012   GERD (gastroesophageal reflux disease) 08/08/2012    PCP: Georganna Skeans, MD   REFERRING PROVIDER: Gustavus Bryant, NP  REFERRING DIAG: decreased LE strength, increased falls risk  Rationale for Evaluation and Treatment: Rehabilitation  THERAPY DIAG:  Other abnormalities of gait and mobility  Unsteadiness on feet  Dizziness and giddiness  ONSET DATE:  chronic  SUBJECTIVE:                                                                                                                                                                                           SUBJECTIVE STATEMENT: No falls. Hasn't had the chance to try the exercises that much. Having some dizziness.   PERTINENT HISTORY:  PMH: Meniere's  disease, right hearing loss, HTN, Type II Diabetes, peripheral neuropathy, anxiety, HLD    PAIN:  Are you having pain? No, not too much. Just some stiffness  Vitals:   04/22/23 0845  BP: 139/63  Pulse: 81     PRECAUTIONS: Fall  PATIENT GOALS: Wants recommendations on what to do   TODAY'S TREATMENT:                                                                                                                              Gaze Adaptation: x1 Viewing Horizontal: Position: Seated, Time: 30 seconds, Reps: 2, and Comment: cues for proper technique, 5/10 dizziness x1 Viewing Vertical:  Position: Seated, Time: 30 seconds, Reps: 2, and Comment: 4/10   Seated Saccades: Horizontal direction: 2 x 10 reps, 5/10 dizziness, notes moving eyes to the L is a little more difficult  Vertical direction: 2 x 10 reps, 6/10 dizziness, having more difficulty in the this direction  In corner on level ground: With feet apart, EC 3 x 30 seconds, losing balance posteriorly and to the L into the wall at times  With feet apart>feet together: EO 2 x 5 reps head turns, 2 x 5 reps head nods, 5/10 dizziness and pt more difficulty with head nods    PATIENT EDUCATION:  Education details: Bringing in walker/rollator to therapy for balance and to help with dizziness as pt did not come in with AD today and pt with hx of falls, purpose of vestibular therapy and purpose of exercises prescribed for HEP and importance of performing at home.  Person educated: Patient Education method: Explanation, Demonstration, Verbal cues, and Handouts Education comprehension: verbalized understanding, returned demonstration, verbal cues required, and needs further  education  HOME EXERCISE PROGRAM: Access Code: [email or web address] URL: [email or web address] Date: 04/22/2023 Prepared by: Sherlie Ban  Exercises - Seated Horizontal Saccades  - 1-2 x daily - 7 x weekly - 2 sets - 10 reps - Seated Vertical Saccades  - 1-2 x  daily - 7 x weekly - 2 sets - 10 reps  Gaze Stabilization: Sitting    Keeping eyes on target on wall 5 feet away, tilt head down 15-30 and move head side to side for __30__ seconds. Repeat while moving head up and down for __30__ seconds. Do __3__ sessions per day.  Rolling    With pillow under head, start on back. Roll slowly to right. Hold position until symptoms subside. Roll slowly onto left side. Hold position until symptoms subside. Repeat sequence __3__ times per session. Do __3__ sessions per day.  Balance: Eyes Open - Bilateral (Varied Surfaces)    Stand, feet shoulder width, eyes open. Maintain balance __30__ seconds. Repeat __3__ times per set. Do ___2_ sets per session. Do _7___ sessions per week. Repeat with eyes closed.   Must have chair in front of you- stand in a corner.  ASSESSMENT:  CLINICAL IMPRESSION: Today's skilled session focused on VOR exercises, saccades, and standing balance tasks working on vestibular deficits. Reviewed HEP provided at last session and purpose of exercises as pt had not tried them that many times due to them making him dizzy. Educated that exercises will make him dizzy, but trying to keep symptoms at 5/10 or less. Pt reporting mainly 5/10 dizziness with tasks throughout session, but able to tolerate. Pt more challenged with exercises in the vertical direction. Will continue per POC.    OBJECTIVE IMPAIRMENTS: decreased activity tolerance, decreased balance, decreased endurance, decreased mobility, difficulty walking, decreased strength, dizziness, and impaired sensation.   ACTIVITY LIMITATIONS: carrying, lifting, standing, squatting, stairs, and locomotion level  PERSONAL FACTORS: Behavior pattern, Past/current experiences, Time since onset of injury/illness/exacerbation, and 1 comorbidity: Meniere's disease, right hearing loss, HTN, Type II Diabetes, peripheral neuropathy, anxiety, HLD    are also affecting patient's functional outcome.    REHAB POTENTIAL: Fair due to chronicity of symptoms   CLINICAL DECISION MAKING: Stable/uncomplicated  EVALUATION COMPLEXITY: Low   GOALS: Goals reviewed with patient? No  SHORT TERM GOALS: Target date: 04/15/2023 STGS DEFERRED TO LTGS, pt has been delayed in being seen due to waiting for [facility] auth   Patient will be independent with initial HEP for vestibular symptoms  Baseline:  Goal status: INITIAL  2.  Pt will report </= 2/5 for all movements on MSQ to indicate improvement in motion sensitivity and improved activity tolerance.   Baseline: up to 4/5 Goal status: INITIAL  3.  Patient will complete >/= 15s on condition 1 of MCTSIB to demonstrate improved static balance Baseline: 9s Goal status: INITIAL  4.  Pt will improve DVA to </= 3 line difference to indicate improved VOR  Baseline: 4 line Goal status: INITIAL  LONG TERM GOALS: Target date: 05/13/2023  Patient will be independent with final HEP for vestibular symptoms  Baseline:  Goal status: INITIAL  2.  Pt will report </= 1/5 for all movements on MSQ to indicate improvement in motion sensitivity and improved activity tolerance.   Baseline: up to 4/5 Goal status: INITIAL  3.  Patient will complete >/=15s of condition 2 on MCTSIB to demonstrate improved static balance Baseline: 4s Goal status: INITIAL  4.  Pt will improve DVA to </= 2 line difference to indicate improved VOR  Baseline:  4 lines Goal status: INITIAL   PLAN:  PT FREQUENCY: 2x/week  PT DURATION: 8 weeks  PLANNED INTERVENTIONS: Therapeutic exercises, Therapeutic activity, Neuromuscular re-education, Balance training, Gait training, Patient/Family education, Self Care, Joint mobilization, Vestibular training, Canalith repositioning, DME instructions, Manual therapy, and Re-evaluation.  PLAN FOR NEXT SESSION:  VOR, saccades, smooth pursuits, balance tasks   Sherlie Ban, PT, DPT 04/22/23 9:25 AM

## 2023-04-23 ENCOUNTER — Other Ambulatory Visit (HOSPITAL_COMMUNITY): Payer: Self-pay

## 2023-04-23 DIAGNOSIS — K219 Gastro-esophageal reflux disease without esophagitis: Secondary | ICD-10-CM | POA: Diagnosis not present

## 2023-04-23 DIAGNOSIS — Z6824 Body mass index (BMI) 24.0-24.9, adult: Secondary | ICD-10-CM | POA: Diagnosis not present

## 2023-04-23 DIAGNOSIS — R2681 Unsteadiness on feet: Secondary | ICD-10-CM | POA: Diagnosis not present

## 2023-04-23 DIAGNOSIS — I129 Hypertensive chronic kidney disease with stage 1 through stage 4 chronic kidney disease, or unspecified chronic kidney disease: Secondary | ICD-10-CM | POA: Diagnosis not present

## 2023-04-23 DIAGNOSIS — Z008 Encounter for other general examination: Secondary | ICD-10-CM | POA: Diagnosis not present

## 2023-04-23 DIAGNOSIS — F325 Major depressive disorder, single episode, in full remission: Secondary | ICD-10-CM | POA: Diagnosis not present

## 2023-04-23 DIAGNOSIS — N182 Chronic kidney disease, stage 2 (mild): Secondary | ICD-10-CM | POA: Diagnosis not present

## 2023-04-23 DIAGNOSIS — E1122 Type 2 diabetes mellitus with diabetic chronic kidney disease: Secondary | ICD-10-CM | POA: Diagnosis not present

## 2023-04-23 MED ORDER — BLOOD GLUCOSE MONITOR KIT
PACK | 0 refills | Status: AC
  Filled 2023-04-23: qty 1, 30d supply, fill #0

## 2023-04-23 MED ORDER — ONETOUCH VERIO VI STRP
ORAL_STRIP | 3 refills | Status: AC
  Filled 2023-04-23: qty 200, 66d supply, fill #0

## 2023-04-23 MED ORDER — ONETOUCH FINEPOINT LANCETS MISC
3 refills | Status: AC
  Filled 2023-04-23: qty 200, 66d supply, fill #0

## 2023-04-27 ENCOUNTER — Ambulatory Visit: Payer: No Typology Code available for payment source | Admitting: Physical Therapy

## 2023-04-27 ENCOUNTER — Encounter: Payer: Self-pay | Admitting: Physical Therapy

## 2023-04-27 DIAGNOSIS — R42 Dizziness and giddiness: Secondary | ICD-10-CM

## 2023-04-27 DIAGNOSIS — M6281 Muscle weakness (generalized): Secondary | ICD-10-CM

## 2023-04-27 DIAGNOSIS — R2689 Other abnormalities of gait and mobility: Secondary | ICD-10-CM

## 2023-04-27 DIAGNOSIS — R2681 Unsteadiness on feet: Secondary | ICD-10-CM

## 2023-04-27 NOTE — Therapy (Signed)
OUTPATIENT PHYSICAL THERAPY VESTIBULAR TREATMENT   Patient Name: Ethan Pierce MRN: [id number] DOB:[birth year], 77 y.o., male Today's Date: 04/27/2023  END OF SESSION:  PT End of Session - 04/27/23 0850     Visit Number 5    Number of Visits 15    Date for PT Re-Evaluation 05/17/23    Authorization Type [facility] - 15 visits  Dates 04/12/23 - 08/10/23    Authorization - Visit Number 2    Authorization - Number of Visits 15    PT Start Time 0848    PT Stop Time 0928    PT Time Calculation (min) 40 min    Equipment Utilized During Treatment Gait belt    Activity Tolerance Patient tolerated treatment well    Behavior During Therapy WFL for tasks assessed/performed             Past Medical History:  Diagnosis Date   Bronchitis    Diabetes mellitus without complication (HCC)    GERD (gastroesophageal reflux disease)    History of 2019 novel coronavirus disease (COVID-19) 05/2019   Hypertension    Past Surgical History:  Procedure Laterality Date   CATARACT EXTRACTION, BILATERAL  2018, 2019   WISDOM TOOTH EXTRACTION  05/2019   Patient Active Problem List   Diagnosis Date Noted   Dental caries on pit and fissure surface penetrating into pulp 01/26/2023   Dental caries on smooth surface penetrating into dentin 01/26/2023   Muscle weakness (generalized) 01/26/2023   Diabetes mellitus type 2, controlled, without complications (HCC) 08/20/2022   Exposure to potentially hazardous substance 08/20/2022   Major depressive disorder 08/20/2022   Migraine 08/20/2022   Nightmare 08/20/2022   Onychomycosis due to dermatophyte 08/20/2022   Open angle with borderline findings, high risk, right eye 08/20/2022   Pain of left hip joint 08/20/2022   Problem related to unspecified psychosocial circumstances 08/20/2022   Vertigo 08/20/2022   Allergic rhinitis 12/19/2021   Anemia 12/19/2021   Anxiety 12/19/2021   COVID-19 12/19/2021   Disorder of refraction and accommodation 12/19/2021    Enthesopathy of ankle and tarsus 12/19/2021   Erectile dysfunction 12/19/2021   Hyperlipidemia 12/19/2021   Legal blindness, as defined in [place] 12/19/2021   Low back pain 12/19/2021   Major depressive disorder, recurrent, moderate (HCC) 12/19/2021   Meniere's disease, bilateral 12/19/2021   Hearing loss in right ear 11/27/2021   History of 2019 novel coronavirus disease (COVID-19) 09/19/2021   Onycholysis 09/19/2021   Peripheral neuropathy 09/19/2021   Plantar fascial fibromatosis 09/19/2021   Sleep apnea 09/19/2021   Abnormal gait 09/19/2021   Cataract 09/19/2021   Bunionette of left foot 09/19/2021   Chronic fatigue syndrome 09/19/2021   Chronic post-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 09/19/2021   Encounter for fitting and adjustment of spectacles and contact lenses 09/19/2021   Sinusitis, acute maxillary 01/08/2021   Diabetes mellitus type 2, uncontrolled, with complications 06/05/2019   Essential hypertension 06/05/2019   Pneumonia due to COVID-19 virus 06/03/2019   Type 2 diabetes mellitus (HCC) 06/03/2019   Fall 06/03/2019   Lumbar radiculopathy 03/03/2018   Diverticulosis of colon 09/09/2012   Hypertension associated with diabetes (HCC) 08/08/2012   GERD (gastroesophageal reflux disease) 08/08/2012    PCP: Georganna Skeans, MD   REFERRING PROVIDER: Gustavus Bryant, NP  REFERRING DIAG: decreased LE strength, increased falls risk  Rationale for Evaluation and Treatment: Rehabilitation  THERAPY DIAG:  Other abnormalities of gait and mobility  Unsteadiness on feet  Muscle weakness (generalized)  Dizziness and  giddiness  ONSET DATE: chronic  SUBJECTIVE:                                                                                                                                                                                           SUBJECTIVE STATEMENT: No falls. Some days feel more wobbly than others. Exercises are tough, but they are do-able. Sees his  PCP tomorrow.   PERTINENT HISTORY:  PMH: Meniere's disease, right hearing loss, HTN, Type II Diabetes, peripheral neuropathy, anxiety, HLD    PAIN:  Are you having pain? 3/10 R shoulder   There were no vitals filed for this visit.  PRECAUTIONS: Fall  PATIENT GOALS: Wants recommendations on what to do   TODAY'S TREATMENT:                                                                                                                              Seated Smooth Pursuits: Horizontal direction: 2 x 10 reps, reports R eye had to work to catch up, feeling a little woozy, pt performs slowly with first rep  Vertical direction: 2 x 10 reps, pt reports horizontal is more of a challenge, mild dizziness noted, pt feels the R eye is straining  Seated VOR Cancellation: 2 x 10 reps, 1st rep pt reporting 6/10 dizziness, 2nd rep 5/10 dizziness   On air ex:  Wide BOS: EC 3 x 30 seconds with fingertip support, pt able to let go for brief periods of time, pt with tendency to lose balance posteriorly  Alternating posterior step and weight shift x15 reps each side, performed fingertip support, pt did not want to try without UE support  In // bars and BUE support:  Gait with head turns down and back x3 reps, pt with mod dizziness, needing intermittent rest break   On rockerboard in A/P direction: Weight shifting with BUE support x15 reps for hip/ankle strategy Trying to keep board steady 4 x 30 seconds, pt preferring to perform with his EC instead of open, pt reports this makes him feel more steady, intermittent UE support   Pt  reporting 4/10 dizziness at end of session, pt reports improvement today compared to previous session   PATIENT EDUCATION:  Education details: Continue HEP, purpose of exercises, Verbally added EO, 5 reps head turns and 5 reps head nods to corner balance exercises  Person educated: Patient Education method: Explanation, Demonstration, Verbal cues, and Handouts Education  comprehension: verbalized understanding, returned demonstration, verbal cues required, and needs further education  HOME EXERCISE PROGRAM: Verbally added EO, 5 reps head turns and 5 reps head nods   Access Code: [email or web address] URL: [email or web address] Date: 04/22/2023 Prepared by: Sherlie Ban  Exercises - Seated Horizontal Saccades  - 1-2 x daily - 7 x weekly - 2 sets - 10 reps - Seated Vertical Saccades  - 1-2 x daily - 7 x weekly - 2 sets - 10 reps  Gaze Stabilization: Sitting    Keeping eyes on target on wall 5 feet away, tilt head down 15-30 and move head side to side for __30__ seconds. Repeat while moving head up and down for __30__ seconds. Do __3__ sessions per day.  Rolling    With pillow under head, start on back. Roll slowly to right. Hold position until symptoms subside. Roll slowly onto left side. Hold position until symptoms subside. Repeat sequence __3__ times per session. Do __3__ sessions per day.  Balance: Eyes Open - Bilateral (Varied Surfaces)    Stand, feet shoulder width, eyes open. Maintain balance __30__ seconds. Repeat __3__ times per set. Do ___2_ sets per session. Do _7___ sessions per week. Repeat with eyes closed.   Must have chair in front of you- stand in a corner.  ASSESSMENT:  CLINICAL IMPRESSION: Today's skilled session focused on seated oculomotor exercises and standing balance tasks for vestibular input and working on compliant surfaces. Pt with most dizziness today with seated VOR cancellation exercises and gait with head turns with BUE support in // bars. Pt overall reporting dizziness at 4/10 at end of session and pt reports improved tolerance to activity today compared to previous sessions. Will continue per POC.    OBJECTIVE IMPAIRMENTS: decreased activity tolerance, decreased balance, decreased endurance, decreased mobility, difficulty walking, decreased strength, dizziness, and impaired sensation.   ACTIVITY  LIMITATIONS: carrying, lifting, standing, squatting, stairs, and locomotion level  PERSONAL FACTORS: Behavior pattern, Past/current experiences, Time since onset of injury/illness/exacerbation, and 1 comorbidity: Meniere's disease, right hearing loss, HTN, Type II Diabetes, peripheral neuropathy, anxiety, HLD    are also affecting patient's functional outcome.   REHAB POTENTIAL: Fair due to chronicity of symptoms   CLINICAL DECISION MAKING: Stable/uncomplicated  EVALUATION COMPLEXITY: Low   GOALS: Goals reviewed with patient? No  SHORT TERM GOALS: Target date: 04/15/2023 STGS DEFERRED TO LTGS, pt has been delayed in being seen due to waiting for [facility] auth   Patient will be independent with initial HEP for vestibular symptoms  Baseline:  Goal status: INITIAL  2.  Pt will report </= 2/5 for all movements on MSQ to indicate improvement in motion sensitivity and improved activity tolerance.   Baseline: up to 4/5 Goal status: INITIAL  3.  Patient will complete >/= 15s on condition 1 of MCTSIB to demonstrate improved static balance Baseline: 9s Goal status: INITIAL  4.  Pt will improve DVA to </= 3 line difference to indicate improved VOR  Baseline: 4 line Goal status: INITIAL  LONG TERM GOALS: Target date: 05/13/2023  Patient will be independent with final HEP for vestibular symptoms  Baseline:  Goal status: INITIAL  2.  Pt will report </=  1/5 for all movements on MSQ to indicate improvement in motion sensitivity and improved activity tolerance.   Baseline: up to 4/5 Goal status: INITIAL  3.  Patient will complete >/=15s of condition 2 on MCTSIB to demonstrate improved static balance Baseline: 4s Goal status: INITIAL  4.  Pt will improve DVA to </= 2 line difference to indicate improved VOR  Baseline: 4 lines Goal status: INITIAL   PLAN:  PT FREQUENCY: 2x/week  PT DURATION: 8 weeks  PLANNED INTERVENTIONS: Therapeutic exercises, Therapeutic activity, Neuromuscular  re-education, Balance training, Gait training, Patient/Family education, Self Care, Joint mobilization, Vestibular training, Canalith repositioning, DME instructions, Manual therapy, and Re-evaluation.  PLAN FOR NEXT SESSION:  VOR, saccades, smooth pursuits, balance tasks   Sherlie Ban, PT, DPT 04/27/23 9:32 AM

## 2023-04-28 ENCOUNTER — Other Ambulatory Visit (HOSPITAL_COMMUNITY): Payer: Self-pay

## 2023-04-28 ENCOUNTER — Ambulatory Visit (INDEPENDENT_AMBULATORY_CARE_PROVIDER_SITE_OTHER): Payer: No Typology Code available for payment source | Admitting: Family Medicine

## 2023-04-28 VITALS — BP 155/79 | HR 71 | Temp 98.1°F | Resp 16 | Wt 172.4 lb

## 2023-04-28 DIAGNOSIS — Z23 Encounter for immunization: Secondary | ICD-10-CM | POA: Diagnosis not present

## 2023-04-28 DIAGNOSIS — I10 Essential (primary) hypertension: Secondary | ICD-10-CM | POA: Diagnosis not present

## 2023-04-28 DIAGNOSIS — E785 Hyperlipidemia, unspecified: Secondary | ICD-10-CM

## 2023-04-28 MED ORDER — AMLODIPINE BESYLATE 10 MG PO TABS
10.0000 mg | ORAL_TABLET | ORAL | 3 refills | Status: AC
  Filled 2023-04-28: qty 90, 90d supply, fill #0

## 2023-04-28 MED ORDER — VALSARTAN-HYDROCHLOROTHIAZIDE 160-12.5 MG PO TABS
1.0000 | ORAL_TABLET | Freq: Every day | ORAL | 1 refills | Status: DC
  Filled 2023-04-28 – 2023-07-05 (×2): qty 90, 90d supply, fill #0
  Filled 2023-10-08: qty 90, 90d supply, fill #1

## 2023-04-28 MED ORDER — METOPROLOL TARTRATE 25 MG PO TABS
25.0000 mg | ORAL_TABLET | Freq: Two times a day (BID) | ORAL | 1 refills | Status: AC
  Filled 2023-04-28: qty 180, 90d supply, fill #0

## 2023-04-28 NOTE — Progress Notes (Signed)
Established Patient Office Visit  Subjective    Patient ID: Ethan Pierce, male    DOB: [birth year]  Age: 77 y.o. MRN: [id number]  CC:  Chief Complaint  Patient presents with   Medical Management of Chronic Issues    HPI Ethan Pierce presents or follow up of chronic med issues. Patient denies acute complaints.   Outpatient Encounter Medications as of 04/28/2023  Medication Sig   albuterol (VENTOLIN HFA) 108 (90 Base) MCG/ACT inhaler Inhale 2 puffs into the lungs every 6 (six) hours as needed for wheezing or shortness of breath.   amLODipine (NORVASC) 10 MG tablet Take 1 tablet (10 mg total) by mouth every morning, for blood pressure.   azithromycin (ZITHROMAX) 200 MG/5ML suspension Take 6.3 mLs (250 mg total) by mouth as directed. Take 12 mL once on day one. Then take 6 mL once daily on days 2-5   azithromycin (ZITHROMAX) 200 MG/5ML suspension Take 12 ml once on day 1. Then take 6 mls once daily on days 2-5 Discard any left after 5 days.   blood glucose meter kit and supplies KIT Check blood sugar three times daily   ciclopirox (PENLAC) 8 % solution Apply topically at bedtime. Apply over nail and surrounding skin. Apply daily over previous coat. After 7 days, may remove with alcohol and continue cycle.   fluticasone (FLONASE) 50 MCG/ACT nasal spray 1 spray in each nostril   glucose blood (ONETOUCH VERIO) test strip Check blood sugar three times daily   LIFESCAN FINEPOINT LANCETS MISC Check blood sugar three times daily   metoprolol tartrate (LOPRESSOR) 25 MG tablet Take 1 tablet (25 mg total) by mouth 2 (two) times daily.   promethazine-dextromethorphan (PROMETHAZINE-DM) 6.25-15 MG/5ML syrup Take 2.5 mLs by mouth at bedtime.   valsartan-hydrochlorothiazide (DIOVAN-HCT) 160-12.5 MG tablet Take 1 tablet by mouth daily, for blood pressure.   [DISCONTINUED] amLODipine (NORVASC) 10 MG tablet Take 1 tablet by mouth once a day in the morning for blood pressure.   [DISCONTINUED] metoprolol  tartrate (LOPRESSOR) 25 MG tablet Take 1 tablet (25 mg) by mouth 2 times daily.   [DISCONTINUED] valsartan-hydrochlorothiazide (DIOVAN-HCT) 160-12.5 MG tablet Take 1 tablet by mouth daily for blood pressure   No facility-administered encounter medications on file as of 04/28/2023.    Past Medical History:  Diagnosis Date   Bronchitis    Diabetes mellitus without complication (HCC)    GERD (gastroesophageal reflux disease)    History of 2019 novel coronavirus disease (COVID-19) 05/2019   Hypertension     Past Surgical History:  Procedure Laterality Date   CATARACT EXTRACTION, BILATERAL  2018, 2019   WISDOM TOOTH EXTRACTION  05/2019    Family History  Problem Relation Age of Onset   Diabetes Mother    GER disease Mother    Cancer Father        pancreas   Diabetes Sister    Diabetes Brother     Social History   Socioeconomic History   Marital status: Married    Spouse name: Not on file   Number of children: 2   Years of education: Not on file   Highest education level: Doctorate  Occupational History   Not on file  Tobacco Use   Smoking status: Never   Smokeless tobacco: Never  Vaping Use   Vaping status: Never Used  Substance and Sexual Activity   Alcohol use: No   Drug use: No   Sexual activity: Yes  Other Topics Concern   Not on  file  Social History Narrative   Not on file   Social Determinants of Health   Financial Resource Strain: Low Risk  (04/28/2023)   Overall Financial Resource Strain (CARDIA)    Difficulty of Paying Living Expenses: Not hard at all  Food Insecurity: No Food Insecurity (04/28/2023)   Hunger Vital Sign    Worried About Running Out of Food in the Last Year: Never true    Ran Out of Food in the Last Year: Never true  Transportation Needs: No Transportation Needs (04/28/2023)   PRAPARE - Administrator, Civil Service (Medical): No    Lack of Transportation (Non-Medical): No  Physical Activity: Unknown (04/28/2023)    Exercise Vital Sign    Days of Exercise per Week: 0 days    Minutes of Exercise per Session: Not on file  Stress: No Stress Concern Present (04/28/2023)   [name] of Occupational Health - Occupational Stress Questionnaire    Feeling of Stress : Not at all  Social Connections: Unknown (04/28/2023)   Social Connection and Isolation Panel [NHANES]    Frequency of Communication with Friends and Family: Patient declined    Frequency of Social Gatherings with Friends and Family: Once a week    Attends Religious Services: More than 4 times per year    Active Member of [employer] or Organizations: Yes    Attends Engineer, structural: More than 4 times per year    Marital Status: Married  Catering manager Violence: Not on file    Review of Systems  All other systems reviewed and are negative.       Objective    BP (!) 155/79   Pulse 71   Temp 98.1 F (36.7 C) (Oral)   Resp 16   Wt 172 lb 6.4 oz (78.2 kg)   SpO2 97%   BMI 26.21 kg/m   Physical Exam Vitals and nursing note reviewed.  Constitutional:      General: He is not in acute distress. Cardiovascular:     Rate and Rhythm: Normal rate and regular rhythm.  Pulmonary:     Effort: Pulmonary effort is normal.     Breath sounds: Normal breath sounds.  Abdominal:     Palpations: Abdomen is soft.     Tenderness: There is no abdominal tenderness.  Skin:    Findings: No bruising.  Neurological:     General: No focal deficit present.     Mental Status: He is alert and oriented to person, place, and time.         Assessment & Plan:   Essential hypertension  Hyperlipidemia, unspecified hyperlipidemia type  Encounter for immunization -     Flu Vaccine Trivalent High Dose (Fluad)  Other orders -     Valsartan-hydroCHLOROthiazide; Take 1 tablet by mouth daily, for blood pressure.  Dispense: 90 tablet; Refill: 1 -     Metoprolol Tartrate; Take 1 tablet (25 mg total) by mouth 2 (two) times daily.  Dispense:  180 tablet; Refill: 1 -     amLODIPine Besylate; Take 1 tablet (10 mg total) by mouth every morning, for blood pressure.  Dispense: 90 tablet; Refill: 3     Return in about 6 months (around 10/26/2023) for follow up, physical.   [name], MD

## 2023-04-29 ENCOUNTER — Ambulatory Visit: Payer: No Typology Code available for payment source

## 2023-04-29 ENCOUNTER — Other Ambulatory Visit (HOSPITAL_COMMUNITY): Payer: Self-pay

## 2023-04-29 VITALS — BP 137/59

## 2023-04-29 DIAGNOSIS — M6281 Muscle weakness (generalized): Secondary | ICD-10-CM

## 2023-04-29 DIAGNOSIS — R2689 Other abnormalities of gait and mobility: Secondary | ICD-10-CM

## 2023-04-29 DIAGNOSIS — R2681 Unsteadiness on feet: Secondary | ICD-10-CM

## 2023-04-29 DIAGNOSIS — R42 Dizziness and giddiness: Secondary | ICD-10-CM

## 2023-04-29 NOTE — Therapy (Signed)
OUTPATIENT PHYSICAL THERAPY VESTIBULAR TREATMENT   Patient Name: Ethan Pierce MRN: [id number] DOB:11-24-1945, 77 y.o., male Today's Date: 04/29/2023  END OF SESSION:  PT End of Session - 04/29/23 0840     Visit Number 6    Number of Visits 15    Date for PT Re-Evaluation 05/17/23    Authorization Type [facility] - 15 visits  Dates 04/12/23 - 08/10/23    Authorization - Visit Number 2    Authorization - Number of Visits 15    PT Start Time 0845    PT Stop Time 0928    PT Time Calculation (min) 43 min    Activity Tolerance Patient tolerated treatment well    Behavior During Therapy [facility] for tasks assessed/performed             Past Medical History:  Diagnosis Date   Bronchitis    Diabetes mellitus without complication (HCC)    GERD (gastroesophageal reflux disease)    History of 2019 novel coronavirus disease (COVID-19) 05/2019   Hypertension    Past Surgical History:  Procedure Laterality Date   CATARACT EXTRACTION, BILATERAL  2018, 2019   WISDOM TOOTH EXTRACTION  05/2019   Patient Active Problem List   Diagnosis Date Noted   Dental caries on pit and fissure surface penetrating into pulp 01/26/2023   Dental caries on smooth surface penetrating into dentin 01/26/2023   Muscle weakness (generalized) 01/26/2023   Diabetes mellitus type 2, controlled, without complications (HCC) 08/20/2022   Exposure to potentially hazardous substance 08/20/2022   Major depressive disorder 08/20/2022   Migraine 08/20/2022   Nightmare 08/20/2022   Onychomycosis due to dermatophyte 08/20/2022   Open angle with borderline findings, high risk, right eye 08/20/2022   Pain of left hip joint 08/20/2022   Problem related to unspecified psychosocial circumstances 08/20/2022   Vertigo 08/20/2022   Allergic rhinitis 12/19/2021   Anemia 12/19/2021   Anxiety 12/19/2021   COVID-19 12/19/2021   Disorder of refraction and accommodation 12/19/2021   Enthesopathy of ankle and tarsus 12/19/2021   Erectile  dysfunction 12/19/2021   Hyperlipidemia 12/19/2021   Legal blindness, as defined in [place] 12/19/2021   Low back pain 12/19/2021   Major depressive disorder, recurrent, moderate (HCC) 12/19/2021   Meniere's disease, bilateral 12/19/2021   Hearing loss in right ear 11/27/2021   History of 2019 novel coronavirus disease (COVID-19) 09/19/2021   Onycholysis 09/19/2021   Peripheral neuropathy 09/19/2021   Plantar fascial fibromatosis 09/19/2021   Sleep apnea 09/19/2021   Abnormal gait 09/19/2021   Cataract 09/19/2021   Bunionette of left foot 09/19/2021   Chronic fatigue syndrome 09/19/2021   Chronic post-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 09/19/2021   Encounter for fitting and adjustment of spectacles and contact lenses 09/19/2021   Sinusitis, acute maxillary 01/08/2021   Diabetes mellitus type 2, uncontrolled, with complications 06/05/2019   Essential hypertension 06/05/2019   Pneumonia due to COVID-19 virus 06/03/2019   Type 2 diabetes mellitus (HCC) 06/03/2019   Fall 06/03/2019   Lumbar radiculopathy 03/03/2018   Diverticulosis of colon 09/09/2012   Hypertension associated with diabetes (HCC) 08/08/2012   GERD (gastroesophageal reflux disease) 08/08/2012    PCP: Georganna Skeans, MD   REFERRING PROVIDER: Gustavus Bryant, NP  REFERRING DIAG: decreased LE strength, increased falls risk  Rationale for Evaluation and Treatment: Rehabilitation  THERAPY DIAG:  Other abnormalities of gait and mobility  Unsteadiness on feet  Muscle weakness (generalized)  Dizziness and giddiness  ONSET DATE: chronic  SUBJECTIVE:  SUBJECTIVE STATEMENT: Patient arrives to clinic alone, using rollator. Reports feeling slow today. Had a fall 2-3 weeks ago and fell on his knee. Walked 41ft to check the mail  without an AD and lost his balance to the R.   PERTINENT HISTORY:  PMH: Meniere's disease, right hearing loss, HTN, Type II Diabetes, peripheral neuropathy, anxiety, HLD    PAIN:  Are you having pain? 3/10 R shoulder   Vitals:   04/29/23 0851  BP: (!) 137/59    PRECAUTIONS: Fall  PATIENT GOALS: Wants recommendations on what to do   TODAY'S TREATMENT:                                                                                                                             -seated horizontal VOR 3x30s  -rated up to 4/5 dizziness  -seated vertical VOR 3x30s   -rated at 3-3.5/5 -seated horizontal Edwyna Shell Chart (head + eyes)  -progressed reading colors of chart horizontal -seated vertical Hart Chart (head + eyes)   -progressed to reading colors of chart vertical   PATIENT EDUCATION:  Education details: Continue HEP, purpose of exercises Person educated: Patient Education method: Explanation, Demonstration, Verbal cues, and Handouts Education comprehension: verbalized understanding, returned demonstration, verbal cues required, and needs further education  HOME EXERCISE PROGRAM: Verbally added EO, 5 reps head turns and 5 reps head nods   Access Code: [email or web address] URL: [email or web address] Date: 04/22/2023 Prepared by: Sherlie Ban  Exercises - Seated Horizontal Saccades  - 1-2 x daily - 7 x weekly - 2 sets - 10 reps - Seated Vertical Saccades  - 1-2 x daily - 7 x weekly - 2 sets - 10 reps  Gaze Stabilization: Sitting    Keeping eyes on target on wall 5 feet away, tilt head down 15-30 and move head side to side for __30__ seconds. Repeat while moving head up and down for __30__ seconds. Do __3__ sessions per day.  Rolling    With pillow under head, start on back. Roll slowly to right. Hold position until symptoms subside. Roll slowly onto left side. Hold position until symptoms subside. Repeat sequence __3__ times per session. Do __3__ sessions per  day.  Balance: Eyes Open - Bilateral (Varied Surfaces)    Stand, feet shoulder width, eyes open. Maintain balance __30__ seconds. Repeat __3__ times per set. Do ___2_ sets per session. Do _7___ sessions per week. Repeat with eyes closed.   Must have chair in front of you- stand in a corner.  ASSESSMENT:  CLINICAL IMPRESSION: Patient seen for skilled PT session with emphasis on vestibular retraining. He is appreciative of education on purpose of exercises. Demonstrates continued challenge with Edwyna Shell Chart saccades exercises, but progressing well. Continue POC.    OBJECTIVE IMPAIRMENTS: decreased activity tolerance, decreased balance, decreased endurance, decreased mobility, difficulty walking, decreased strength, dizziness, and impaired sensation.   ACTIVITY LIMITATIONS: carrying, lifting, standing, squatting, stairs, and locomotion level  PERSONAL FACTORS: Behavior pattern, Past/current experiences, Time  since onset of injury/illness/exacerbation, and 1 comorbidity: Meniere's disease, right hearing loss, HTN, Type II Diabetes, peripheral neuropathy, anxiety, HLD    are also affecting patient's functional outcome.   REHAB POTENTIAL: Fair due to chronicity of symptoms   CLINICAL DECISION MAKING: Stable/uncomplicated  EVALUATION COMPLEXITY: Low   GOALS: Goals reviewed with patient? No  SHORT TERM GOALS: Target date: 04/15/2023 [facility]GS DEFERRED TO [facility], pt has been delayed in being seen due to waiting for [facility] auth   Patient will be independent with initial HEP for vestibular symptoms  Baseline:  Goal status: INITIAL  2.  Pt will report </= 2/5 for all movements on MSQ to indicate improvement in motion sensitivity and improved activity tolerance.   Baseline: up to 4/5 Goal status: INITIAL  3.  Patient will complete >/= 15s on condition 1 of MCTSIB to demonstrate improved static balance Baseline: 9s Goal status: INITIAL  4.  Pt will improve DVA to </= 3 line difference to  indicate improved VOR  Baseline: 4 line Goal status: INITIAL  LONG TERM GOALS: Target date: 05/13/2023  Patient will be independent with final HEP for vestibular symptoms  Baseline:  Goal status: INITIAL  2.  Pt will report </= 1/5 for all movements on MSQ to indicate improvement in motion sensitivity and improved activity tolerance.   Baseline: up to 4/5 Goal status: INITIAL  3.  Patient will complete >/=15s of condition 2 on MCTSIB to demonstrate improved static balance Baseline: 4s Goal status: INITIAL  4.  Pt will improve DVA to </= 2 line difference to indicate improved VOR  Baseline: 4 lines Goal status: INITIAL   PLAN:  PT FREQUENCY: 2x/week  PT DURATION: 8 weeks  PLANNED INTERVENTIONS: Therapeutic exercises, Therapeutic activity, Neuromuscular re-education, Balance training, Gait training, Patient/Family education, Self Care, Joint mobilization, Vestibular training, Canalith repositioning, DME instructions, Manual therapy, and Re-evaluation.  PLAN FOR NEXT SESSION:  VOR, saccades, smooth pursuits, balance tasks   Sherlie Ban, PT, DPT 04/29/23 9:36 AM

## 2023-05-04 ENCOUNTER — Ambulatory Visit: Payer: No Typology Code available for payment source | Attending: Family Medicine | Admitting: Physical Therapy

## 2023-05-04 ENCOUNTER — Encounter: Payer: Self-pay | Admitting: Physical Therapy

## 2023-05-04 DIAGNOSIS — R42 Dizziness and giddiness: Secondary | ICD-10-CM | POA: Insufficient documentation

## 2023-05-04 DIAGNOSIS — R2681 Unsteadiness on feet: Secondary | ICD-10-CM | POA: Diagnosis present

## 2023-05-04 DIAGNOSIS — M6281 Muscle weakness (generalized): Secondary | ICD-10-CM | POA: Diagnosis present

## 2023-05-04 DIAGNOSIS — R2689 Other abnormalities of gait and mobility: Secondary | ICD-10-CM | POA: Insufficient documentation

## 2023-05-04 NOTE — Therapy (Signed)
OUTPATIENT PHYSICAL THERAPY VESTIBULAR TREATMENT   Patient Name: Ethan Pierce MRN: [id number] DOB:Oct 06, 1945, 77 y.o., male Today's Date: 05/04/2023  END OF SESSION:  PT End of Session - 05/04/23 0850     Visit Number 7    Number of Visits 15    Date for PT Re-Evaluation 05/17/23    Authorization Type [facility] - 15 visits  Dates 04/12/23 - 08/10/23    Authorization - Visit Number 4    Authorization - Number of Visits 15    PT Start Time 0849    PT Stop Time 0929    PT Time Calculation (min) 40 min    Equipment Utilized During Treatment Gait belt    Activity Tolerance Patient tolerated treatment well    Behavior During Therapy WFL for tasks assessed/performed             Past Medical History:  Diagnosis Date   Bronchitis    Diabetes mellitus without complication (HCC)    GERD (gastroesophageal reflux disease)    History of 2019 novel coronavirus disease (COVID-19) 05/2019   Hypertension    Past Surgical History:  Procedure Laterality Date   CATARACT EXTRACTION, BILATERAL  2018, 2019   WISDOM TOOTH EXTRACTION  05/2019   Patient Active Problem List   Diagnosis Date Noted   Dental caries on pit and fissure surface penetrating into pulp 01/26/2023   Dental caries on smooth surface penetrating into dentin 01/26/2023   Muscle weakness (generalized) 01/26/2023   Diabetes mellitus type 2, controlled, without complications (HCC) 08/20/2022   Exposure to potentially hazardous substance 08/20/2022   Major depressive disorder 08/20/2022   Migraine 08/20/2022   Nightmare 08/20/2022   Onychomycosis due to dermatophyte 08/20/2022   Open angle with borderline findings, high risk, right eye 08/20/2022   Pain of left hip joint 08/20/2022   Problem related to unspecified psychosocial circumstances 08/20/2022   Vertigo 08/20/2022   Allergic rhinitis 12/19/2021   Anemia 12/19/2021   Anxiety 12/19/2021   COVID-19 12/19/2021   Disorder of refraction and accommodation 12/19/2021    Enthesopathy of ankle and tarsus 12/19/2021   Erectile dysfunction 12/19/2021   Hyperlipidemia 12/19/2021   Legal blindness, as defined in [place] 12/19/2021   Low back pain 12/19/2021   Major depressive disorder, recurrent, moderate (HCC) 12/19/2021   Meniere's disease, bilateral 12/19/2021   Hearing loss in right ear 11/27/2021   History of 2019 novel coronavirus disease (COVID-19) 09/19/2021   Onycholysis 09/19/2021   Peripheral neuropathy 09/19/2021   Plantar fascial fibromatosis 09/19/2021   Sleep apnea 09/19/2021   Abnormal gait 09/19/2021   Cataract 09/19/2021   Bunionette of left foot 09/19/2021   Chronic fatigue syndrome 09/19/2021   Chronic post-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 09/19/2021   Encounter for fitting and adjustment of spectacles and contact lenses 09/19/2021   Sinusitis, acute maxillary 01/08/2021   Diabetes mellitus type 2, uncontrolled, with complications 06/05/2019   Essential hypertension 06/05/2019   Pneumonia due to COVID-19 virus 06/03/2019   Type 2 diabetes mellitus (HCC) 06/03/2019   Fall 06/03/2019   Lumbar radiculopathy 03/03/2018   Diverticulosis of colon 09/09/2012   Hypertension associated with diabetes (HCC) 08/08/2012   GERD (gastroesophageal reflux disease) 08/08/2012    PCP: Georganna Skeans, MD   REFERRING PROVIDER: Gustavus Bryant, NP  REFERRING DIAG: decreased LE strength, increased falls risk  Rationale for Evaluation and Treatment: Rehabilitation  THERAPY DIAG:  Other abnormalities of gait and mobility  Unsteadiness on feet  Muscle weakness (generalized)  Dizziness and  giddiness  ONSET DATE: chronic  SUBJECTIVE:                                                                                                                                                                                           SUBJECTIVE STATEMENT: Nothing new since he was last here, everything feeling the same. No new falls. Arrives with no AD.  Got a flu shot last week and felt pretty drained by it.   PERTINENT HISTORY:  PMH: Meniere's disease, right hearing loss, HTN, Type II Diabetes, peripheral neuropathy, anxiety, HLD    PAIN:  Are you having pain? No pain, just moving more slowly today.   There were no vitals filed for this visit.   PRECAUTIONS: Fall  PATIENT GOALS: Wants recommendations on what to do   TODAY'S TREATMENT:              Gaze Adaptation: x1 Viewing Horizontal: Position: Seated, Time: 30 seconds, Reps: 2, and Comment: pt reporting 3.5-4/10 dizziness, feels like brain is trying to catch up with eyes  Performed slowly in standing 2 x 30 seconds, 3.5/10 dizziness, felt a little wobbly with the 2nd set  x1 Viewing Vertical:  Position: Seated, Time: Seated, Reps: 2, and Comment: 5/10 dizziness, pt reporting more intense with 2nd rep   [facility]: 3 minutes 14 seconds (moderate dizziness, eyes kept getting lost), 2 minutes 26 seconds (pt feeling a little better), pt needing mod/max verbal cues on how to correctly perform, pt not able to make it through the whole alphabet   On air ex: Wide BOS > feet hip width: 2 x 10 reps head turns, 2 x 10 reps head nods, pt more symptomatic with head nods  With 2 floor bubbles: alternating SLS forward taps x10 reps, then progressing to forward/cross body taps x10 reps, beginning with UE support > none, pt pretty challenged with this, intermittent taps to wall for balance        PATIENT EDUCATION:  Education details: Continue HEP and progressing head motions to 10 reps for HEP, purpose of exercises, provided extra handouts of [employer] to [employer]  Person educated: Patient Education method: Programmer, multimedia, Demonstration, Verbal cues, and Handouts Education comprehension: verbalized understanding, returned demonstration, verbal cues required, and needs further education  HOME EXERCISE PROGRAM: Verbally added EO, 10 reps head turns and 10 reps head nods,  standing on your cushion   Access Code: [email or web address] URL: [email or web address] Date: 04/22/2023 Prepared by: Sherlie Ban  Exercises - Seated Horizontal Saccades  - 1-2 x daily - 7 x weekly - 2 sets - 10 reps - Seated Vertical Saccades  -  1-2 x daily - 7 x weekly - 2 sets - 10 reps  Gaze Stabilization: Sitting    Keeping eyes on target on wall 5 feet away, tilt head down 15-30 and move head side to side for __30__ seconds. Repeat while moving head up and down for __30__ seconds. Do __3__ sessions per day.  Rolling    With pillow under head, start on back. Roll slowly to right. Hold position until symptoms subside. Roll slowly onto left side. Hold position until symptoms subside. Repeat sequence __3__ times per session. Do __3__ sessions per day.  Balance: Eyes Open - Bilateral (Varied Surfaces)    Stand, feet shoulder width, eyes open. Maintain balance __30__ seconds. Repeat __3__ times per set. Do ___2_ sets per session. Do _7___ sessions per week. Repeat with eyes closed.   Must have chair in front of you- stand in a corner.  ASSESSMENT:  CLINICAL IMPRESSION: Today's skilled PT session focused on continued vestibular retraining. Pt challenged by [facility] exercises for working on saccades, with pt needing cues throughout, as pt with tendency to get lost with his vision. Pt continues to report mild/mod symptoms with VOR tasks. Pt able to demo improved balance with balance on air ex with head motions, with less intermittent UE support needed.  Will continue per POC.    OBJECTIVE IMPAIRMENTS: decreased activity tolerance, decreased balance, decreased endurance, decreased mobility, difficulty walking, decreased strength, dizziness, and impaired sensation.   ACTIVITY LIMITATIONS: carrying, lifting, standing, squatting, stairs, and locomotion level  PERSONAL FACTORS: Behavior pattern, Past/current experiences, Time since onset of  injury/illness/exacerbation, and 1 comorbidity: Meniere's disease, right hearing loss, HTN, Type II Diabetes, peripheral neuropathy, anxiety, HLD    are also affecting patient's functional outcome.   REHAB POTENTIAL: Fair due to chronicity of symptoms   CLINICAL DECISION MAKING: Stable/uncomplicated  EVALUATION COMPLEXITY: Low   GOALS: Goals reviewed with patient? No  SHORT TERM GOALS: Target date: 04/15/2023 STGS DEFERRED TO LTGS, pt has been delayed in being seen due to waiting for [facility] auth   Patient will be independent with initial HEP for vestibular symptoms  Baseline:  Goal status: INITIAL  2.  Pt will report </= 2/5 for all movements on MSQ to indicate improvement in motion sensitivity and improved activity tolerance.   Baseline: up to 4/5 Goal status: INITIAL  3.  Patient will complete >/= 15s on condition 1 of MCTSIB to demonstrate improved static balance Baseline: 9s Goal status: INITIAL  4.  Pt will improve DVA to </= 3 line difference to indicate improved VOR  Baseline: 4 line Goal status: INITIAL  LONG TERM GOALS: Target date: 05/13/2023  Patient will be independent with final HEP for vestibular symptoms  Baseline:  Goal status: INITIAL  2.  Pt will report </= 1/5 for all movements on MSQ to indicate improvement in motion sensitivity and improved activity tolerance.   Baseline: up to 4/5 Goal status: INITIAL  3.  Patient will complete >/=15s of condition 2 on MCTSIB to demonstrate improved static balance Baseline: 4s Goal status: INITIAL  4.  Pt will improve DVA to </= 2 line difference to indicate improved VOR  Baseline: 4 lines Goal status: INITIAL   PLAN:  PT FREQUENCY: 2x/week  PT DURATION: 8 weeks  PLANNED INTERVENTIONS: Therapeutic exercises, Therapeutic activity, Neuromuscular re-education, Balance training, Gait training, Patient/Family education, Self Care, Joint mobilization, Vestibular training, Canalith repositioning, DME instructions,  Manual therapy, and Re-evaluation.  PLAN FOR NEXT SESSION:  VOR, saccades, smooth pursuits, balance  tasks, unlevel surfaces  Sherlie Ban, PT, DPT 05/04/23 9:30 AM

## 2023-05-06 ENCOUNTER — Ambulatory Visit: Payer: No Typology Code available for payment source

## 2023-05-11 ENCOUNTER — Telehealth: Payer: Self-pay | Admitting: Physical Therapy

## 2023-05-11 ENCOUNTER — Ambulatory Visit: Payer: No Typology Code available for payment source | Admitting: Physical Therapy

## 2023-05-11 NOTE — Telephone Encounter (Signed)
Called pt regarding no-show appt today. Pt reporting that he did not have a good night last night. Reminded pt of next appt, who confirms he will be here on Thursday on 10/10  Sherlie Ban, PT, DPT 05/11/23 9:06 AM    [facility] [address] [place], [place]  [address] Phone:  [phone] Fax:  [phone]

## 2023-05-13 ENCOUNTER — Ambulatory Visit: Payer: No Typology Code available for payment source

## 2023-05-13 DIAGNOSIS — R2689 Other abnormalities of gait and mobility: Secondary | ICD-10-CM | POA: Diagnosis not present

## 2023-05-13 DIAGNOSIS — M6281 Muscle weakness (generalized): Secondary | ICD-10-CM

## 2023-05-13 DIAGNOSIS — R2681 Unsteadiness on feet: Secondary | ICD-10-CM

## 2023-05-13 NOTE — Therapy (Signed)
OUTPATIENT PHYSICAL THERAPY VESTIBULAR TREATMENT-DISCHARGE SUMMARY   Patient Name: Ethan Pierce MRN: [id number] DOB:June 20, 1946, 77 y.o., male Today's Date: 05/13/2023   PHYSICAL THERAPY DISCHARGE SUMMARY  Visits from Start of Care: 8  Current functional level related to goals / functional outcomes: See below   Remaining deficits: See below   Education / Equipment: PT POC, HEP, pathophys of vestibular dysfunction   Patient agrees to discharge. Patient goals were not met. Patient is being discharged due to did not respond to therapy.   END OF SESSION:  PT End of Session - 05/13/23 0847     Visit Number 8    Number of Visits 15    Date for PT Re-Evaluation 05/17/23    Authorization Type VA - 15 visits  Dates 04/12/23 - 08/10/23    Authorization - Visit Number 4    Authorization - Number of Visits 15    PT Start Time 0847    PT Stop Time 0900    PT Time Calculation (min) 13 min    Activity Tolerance Patient tolerated treatment well    Behavior During Therapy [facility] for tasks assessed/performed             Past Medical History:  Diagnosis Date   Bronchitis    Diabetes mellitus without complication (HCC)    GERD (gastroesophageal reflux disease)    History of 2019 novel coronavirus disease (COVID-19) 05/2019   Hypertension    Past Surgical History:  Procedure Laterality Date   CATARACT EXTRACTION, BILATERAL  2018, 2019   WISDOM TOOTH EXTRACTION  05/2019   Patient Active Problem List   Diagnosis Date Noted   Dental caries on pit and fissure surface penetrating into pulp 01/26/2023   Dental caries on smooth surface penetrating into dentin 01/26/2023   Muscle weakness (generalized) 01/26/2023   Diabetes mellitus type 2, controlled, without complications (HCC) 08/20/2022   Exposure to potentially hazardous substance 08/20/2022   Major depressive disorder 08/20/2022   Migraine 08/20/2022   Nightmare 08/20/2022   Onychomycosis due to dermatophyte 08/20/2022   Open  angle with borderline findings, high risk, right eye 08/20/2022   Pain of left hip joint 08/20/2022   Problem related to unspecified psychosocial circumstances 08/20/2022   Vertigo 08/20/2022   Allergic rhinitis 12/19/2021   Anemia 12/19/2021   Anxiety 12/19/2021   COVID-19 12/19/2021   Disorder of refraction and accommodation 12/19/2021   Enthesopathy of ankle and tarsus 12/19/2021   Erectile dysfunction 12/19/2021   Hyperlipidemia 12/19/2021   Legal blindness, as defined in [place] 12/19/2021   Low back pain 12/19/2021   Major depressive disorder, recurrent, moderate (HCC) 12/19/2021   Meniere's disease, bilateral 12/19/2021   Hearing loss in right ear 11/27/2021   History of 2019 novel coronavirus disease (COVID-19) 09/19/2021   Onycholysis 09/19/2021   Peripheral neuropathy 09/19/2021   Plantar fascial fibromatosis 09/19/2021   Sleep apnea 09/19/2021   Abnormal gait 09/19/2021   Cataract 09/19/2021   Bunionette of left foot 09/19/2021   Chronic fatigue syndrome 09/19/2021   Chronic post-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 09/19/2021   Encounter for fitting and adjustment of spectacles and contact lenses 09/19/2021   Sinusitis, acute maxillary 01/08/2021   Diabetes mellitus type 2, uncontrolled, with complications 06/05/2019   Essential hypertension 06/05/2019   Pneumonia due to COVID-19 virus 06/03/2019   Type 2 diabetes mellitus (HCC) 06/03/2019   Fall 06/03/2019   Lumbar radiculopathy 03/03/2018   Diverticulosis of colon 09/09/2012   Hypertension associated with diabetes (HCC) 08/08/2012  GERD (gastroesophageal reflux disease) 08/08/2012    PCP: Georganna Skeans, MD   REFERRING PROVIDER: Gustavus Bryant, NP  REFERRING DIAG: decreased LE strength, increased falls risk  Rationale for Evaluation and Treatment: Rehabilitation  THERAPY DIAG:  Other abnormalities of gait and mobility  Unsteadiness on feet  Muscle weakness (generalized)  ONSET DATE:  chronic  SUBJECTIVE:                                                                                                                                                                                           SUBJECTIVE STATEMENT: Patient reports doing fair- cold weather is impacting him. Denies falls.   PERTINENT HISTORY:  PMH: Meniere's disease, right hearing loss, HTN, Type II Diabetes, peripheral neuropathy, anxiety, HLD    PAIN:  Are you having pain? No pain, just moving more slowly today.   There were no vitals filed for this visit.   PRECAUTIONS: Fall  PATIENT GOALS: Wants recommendations on what to do   TODAY'S TREATMENT:             Motion Sensitivity Quotient  Intensity: 0 = none, 1 = Lightheaded, 2 = Mild, 3 = Moderate, 4 = Severe, 5 = Vomiting  Intensity  1. Sitting to supine 3.5-4  2. Supine to L side 3.5  3. Supine to R side 4  4. Supine to sitting 4  5. L Hallpike-Dix   6. Up from L    7. R Hallpike-Dix   8. Up from R    9. Sitting, head  tipped to L knee 3.5-4  10. Head up from L  knee 3  11. Sitting, head  tipped to R knee 3.5-4  12. Head up from R  knee 3  13. Sitting head turns x5 4  14.Sitting head nods x5 3.5  15. In stance, 180  turn to L  3.5  16. In stance, 180  turn to R 3.5   DVA: Static line 6 Dynamic line 3  MCSTIB:  Condition 1: 15s       PATIENT EDUCATION:  Education details: exam findings Person educated: Patient Education method: Explanation, Demonstration, Verbal cues, and Handouts Education comprehension: verbalized understanding, returned demonstration, verbal cues required, and needs further education  HOME EXERCISE PROGRAM: Verbally added EO, 10 reps head turns and 10 reps head nods, standing on your cushion   Access Code: [email or web address] URL: [email or web address] Date: 04/22/2023 Prepared by: Sherlie Ban  Exercises - Seated Horizontal Saccades  - 1-2 x daily - 7 x weekly - 2 sets - 10 reps -  Seated Vertical Saccades  - 1-2 x daily - 7 x  weekly - 2 sets - 10 reps  Gaze Stabilization: Sitting    Keeping eyes on target on wall 5 feet away, tilt head down 15-30 and move head side to side for __30__ seconds. Repeat while moving head up and down for __30__ seconds. Do __3__ sessions per day.  Rolling    With pillow under head, start on back. Roll slowly to right. Hold position until symptoms subside. Roll slowly onto left side. Hold position until symptoms subside. Repeat sequence __3__ times per session. Do __3__ sessions per day.  Balance: Eyes Open - Bilateral (Varied Surfaces)    Stand, feet shoulder width, eyes open. Maintain balance __30__ seconds. Repeat __3__ times per set. Do ___2_ sets per session. Do _7___ sessions per week. Repeat with eyes closed.   Must have chair in front of you- stand in a corner.  ASSESSMENT:  CLINICAL IMPRESSION: Patient seen for skilled PT session with emphasis on goal assessment and dc. He is agreeable to dc. He met 1/4 LTG and made minimal progress toward remaining goals. Patient with relatively limited participation in therapy rationalizing symptoms and how they will not respond to exercises before trying the exercises. He remains with high levels of motion sensitivity, impaired DVA and poor balance as assessed with the MCTSIB. He is to dc from PT at this time.    OBJECTIVE IMPAIRMENTS: decreased activity tolerance, decreased balance, decreased endurance, decreased mobility, difficulty walking, decreased strength, dizziness, and impaired sensation.   ACTIVITY LIMITATIONS: carrying, lifting, standing, squatting, stairs, and locomotion level  PERSONAL FACTORS: Behavior pattern, Past/current experiences, Time since onset of injury/illness/exacerbation, and 1 comorbidity: Meniere's disease, right hearing loss, HTN, Type II Diabetes, peripheral neuropathy, anxiety, HLD    are also affecting patient's functional outcome.   REHAB POTENTIAL:  Fair due to chronicity of symptoms   CLINICAL DECISION MAKING: Stable/uncomplicated  EVALUATION COMPLEXITY: Low   GOALS: Goals reviewed with patient? No  SHORT TERM GOALS: Target date: 04/15/2023 STGS DEFERRED TO LTGS, pt has been delayed in being seen due to waiting for [facility] auth   Patient will be independent with initial HEP for vestibular symptoms  Baseline:  Goal status: INITIAL  2.  Pt will report </= 2/5 for all movements on MSQ to indicate improvement in motion sensitivity and improved activity tolerance.   Baseline: up to 4/5; up to 4/5 Goal status: NOT MET  3.  Patient will complete >/= 15s on condition 1 of MCTSIB to demonstrate improved static balance Baseline: 9s, 15s Goal status: MET  4.  Pt will improve DVA to </= 3 line difference to indicate improved VOR  Baseline: 4 line, 3 line difference Goal status: MET  LONG TERM GOALS: Target date: 05/13/2023  Patient will be independent with final HEP for vestibular symptoms  Baseline: provided Goal status: MET  2.  Pt will report </= 1/5 for all movements on MSQ to indicate improvement in motion sensitivity and improved activity tolerance.   Baseline: up to 4/5, 4/5 Goal status: NOT MET  3.  Patient will complete >/=15s of condition 2 on MCTSIB to demonstrate improved static balance Baseline: 4s, unable Goal status: NOT MET  4.  Pt will improve DVA to </= 2 line difference to indicate improved VOR  Baseline: 4 lines, 3 lines Goal status: NOT MET   PLAN:  PT FREQUENCY: 2x/week  PT DURATION: 8 weeks  PLANNED INTERVENTIONS: Therapeutic exercises, Therapeutic activity, Neuromuscular re-education, Balance training, Gait training, Patient/Family education, Self Care, Joint mobilization, Vestibular training, Canalith repositioning, DME  instructions, Manual therapy, and Re-evaluation.  PLAN FOR NEXT SESSION:  dc from PT   Westley Foots, PT, DPT, CBIS  05/13/23 9:13 AM

## 2023-05-26 ENCOUNTER — Telehealth: Payer: Self-pay | Admitting: Podiatry

## 2023-05-26 NOTE — Telephone Encounter (Signed)
Called pt to let him know that I got a response back from [facility] that he was not authorized for additional visits for community care with our office. I told him we could keep his appt on Monday, 06/07/23 at 8:45 am and that we would just have to file his personal insurance. Pt is aware and stated he is going to speak to the [place] about getting future visits approved. I told him that my contact with the [facility] stated they are not approving as many additional visits for community care with our office as they are trying to have the Veterans come to the [place] facility for their appointments.

## 2023-06-07 ENCOUNTER — Encounter: Payer: Self-pay | Admitting: Podiatry

## 2023-06-07 ENCOUNTER — Ambulatory Visit (INDEPENDENT_AMBULATORY_CARE_PROVIDER_SITE_OTHER): Payer: No Typology Code available for payment source | Admitting: Podiatry

## 2023-06-07 DIAGNOSIS — B351 Tinea unguium: Secondary | ICD-10-CM

## 2023-06-07 DIAGNOSIS — M79674 Pain in right toe(s): Secondary | ICD-10-CM | POA: Diagnosis not present

## 2023-06-07 DIAGNOSIS — E1142 Type 2 diabetes mellitus with diabetic polyneuropathy: Secondary | ICD-10-CM | POA: Diagnosis not present

## 2023-06-07 DIAGNOSIS — M79675 Pain in left toe(s): Secondary | ICD-10-CM | POA: Diagnosis not present

## 2023-06-07 NOTE — Progress Notes (Signed)
This patient returns to my office for at risk foot care.  This patient requires this care by a professional since this patient will be at risk due to having diabetes with neuropathy.  This patient is unable to cut nails himself since the patient cannot reach his nails.These nails are painful walking and wearing shoes.  This patient presents for at risk foot care today.  General Appearance  Alert, conversant and in no acute stress.  Vascular  Dorsalis pedis and posterior tibial  pulses are palpable  bilaterally.  Capillary return is within normal limits  bilaterally. Temperature is within normal limits  bilaterally.  Neurologic  Senn-Weinstein monofilament wire test within normal limits  bilaterally. Muscle power within normal limits bilaterally.  Nails Thick disfigured discolored nails with subungual debris  from hallux to fifth toes bilaterally. No evidence of bacterial infection or drainage bilaterally.  Orthopedic  No limitations of motion  feet .  No crepitus or effusions noted.  No bony pathology or digital deformities noted.  Skin  normotropic skin with no porokeratosis noted bilaterally.  No signs of infections or ulcers noted.     Onychomycosis  Pain in right toes  Pain in left toes  Consent was obtained for treatment procedures.   Mechanical debridement of nails 1-5  bilaterally performed with a nail nipper.  Filed with dremel without incident.    Return office visit     3 months                Told patient to return for periodic foot care and evaluation due to potential at risk complications.   Macala Baldonado DPM  

## 2023-07-05 ENCOUNTER — Other Ambulatory Visit (HOSPITAL_COMMUNITY): Payer: Self-pay

## 2023-07-20 ENCOUNTER — Other Ambulatory Visit (HOSPITAL_COMMUNITY): Payer: Self-pay

## 2023-09-07 ENCOUNTER — Ambulatory Visit (INDEPENDENT_AMBULATORY_CARE_PROVIDER_SITE_OTHER): Payer: No Typology Code available for payment source | Admitting: Podiatry

## 2023-09-07 ENCOUNTER — Encounter: Payer: Self-pay | Admitting: Podiatry

## 2023-09-07 DIAGNOSIS — B351 Tinea unguium: Secondary | ICD-10-CM | POA: Diagnosis not present

## 2023-09-07 DIAGNOSIS — M2141 Flat foot [pes planus] (acquired), right foot: Secondary | ICD-10-CM | POA: Diagnosis not present

## 2023-09-07 DIAGNOSIS — M21621 Bunionette of right foot: Secondary | ICD-10-CM | POA: Diagnosis not present

## 2023-09-07 DIAGNOSIS — M21622 Bunionette of left foot: Secondary | ICD-10-CM

## 2023-09-07 DIAGNOSIS — E1142 Type 2 diabetes mellitus with diabetic polyneuropathy: Secondary | ICD-10-CM | POA: Diagnosis not present

## 2023-09-07 DIAGNOSIS — M79674 Pain in right toe(s): Secondary | ICD-10-CM

## 2023-09-07 DIAGNOSIS — M79675 Pain in left toe(s): Secondary | ICD-10-CM | POA: Diagnosis not present

## 2023-09-07 DIAGNOSIS — M2142 Flat foot [pes planus] (acquired), left foot: Secondary | ICD-10-CM

## 2023-09-07 NOTE — Progress Notes (Signed)
 This patient returns to my office for at risk foot care.  This patient requires this care by a professional since this patient will be at risk due to having diabetes with neuropathy. This patient is unable to cut nails himself since the patient cannot reach his nails.These nails are painful walking and wearing shoes.  This patient presents for at risk foot care today.  General Appearance  Alert, conversant and in no acute stress.  Vascular  Dorsalis pedis and posterior tibial  pulses are palpable  bilaterally.  Capillary return is within normal limits  bilaterally. Temperature is within normal limits  bilaterally.  Neurologic  Senn-Weinstein monofilament wire test diminished  bilaterally. Muscle power within normal limits bilaterally.  Nails Thick disfigured discolored nails with subungual debris  from hallux to fifth toes bilaterally. No evidence of bacterial infection or drainage bilaterally.  Orthopedic  No limitations of motion  feet .  No crepitus or effusions noted.  Pes planus  B/L.  Tailors bunion.  Skin  normotropic skin with no porokeratosis noted bilaterally.  No signs of infections or ulcers noted.     Onychomycosis  Pain in right toes  Pain in left toes  Consent was obtained for treatment procedures.   Mechanical debridement of nails 1-5  bilaterally performed with a nail nipper.  Filed with dremel without incident.    Return office visit      3 months                Told patient to return for periodic foot care and evaluation due to potential at risk complications.   Ruffin Cotton DPM

## 2023-09-22 ENCOUNTER — Ambulatory Visit (HOSPITAL_COMMUNITY)
Admission: EM | Admit: 2023-09-22 | Discharge: 2023-09-22 | Disposition: A | Discharge status: Home / Self Care | Payer: No Typology Code available for payment source | Attending: Family Medicine | Admitting: Family Medicine

## 2023-09-22 ENCOUNTER — Other Ambulatory Visit (HOSPITAL_COMMUNITY): Payer: Self-pay

## 2023-09-22 ENCOUNTER — Encounter (HOSPITAL_COMMUNITY): Payer: Self-pay

## 2023-09-22 DIAGNOSIS — J069 Acute upper respiratory infection, unspecified: Secondary | ICD-10-CM | POA: Diagnosis not present

## 2023-09-22 MED ORDER — AZITHROMYCIN 200 MG/5ML PO SUSR
ORAL | 0 refills | Status: AC
  Filled 2023-09-22: qty 60, 5d supply, fill #0

## 2023-09-22 MED ORDER — ALBUTEROL SULFATE (2.5 MG/3ML) 0.083% IN NEBU
INHALATION_SOLUTION | RESPIRATORY_TRACT | Status: AC
  Filled 2023-09-22: qty 3

## 2023-09-22 MED ORDER — PROMETHAZINE-DM 6.25-15 MG/5ML PO SYRP
5.0000 mL | ORAL_SOLUTION | Freq: Four times a day (QID) | ORAL | 0 refills | Status: AC | PRN
  Filled 2023-09-22: qty 118, 6d supply, fill #0

## 2023-09-22 MED ORDER — ALBUTEROL SULFATE (2.5 MG/3ML) 0.083% IN NEBU
2.5000 mg | INHALATION_SOLUTION | Freq: Once | RESPIRATORY_TRACT | Status: AC
  Administered 2023-09-22: 2.5 mg via RESPIRATORY_TRACT

## 2023-09-22 NOTE — ED Triage Notes (Signed)
Pt c/o of green colored nasal drainage and sinus pressure.    Start Date: Two to three days ago   Home Interventions: Flonase

## 2023-09-22 NOTE — ED Notes (Signed)
Pt stated he had a fall 2-4 months ago. He stumbled on the curb. Pt denies hitting his head or losing consciousness.

## 2023-09-22 NOTE — Discharge Instructions (Addendum)
You were seen today for upper respiratory symptoms.  I have given you a breathing treatment today.  I have sent out a cough medication.  You may continue the flonase as needed for the sinus issues.  I have sent out a liquid antibiotic for you.  As discussed, do not start this at this time, as you have no signs of a bacterial infection.  Please monitor your symptoms and fill only if you are not improving by Sunday.

## 2023-09-22 NOTE — ED Provider Notes (Signed)
[facility]    CSN: [id number] Arrival date & time: 09/22/23  4098      History   Chief Complaint Chief Complaint  Patient presents with   Nasal Congestion    HPI Ethan Pierce is a 78 y.o. male.   Patient is here for sinus congestion, drainage for about 3 days.  Having some sinus pressure.  No fevers/chills.  Mild cough.  Mild tightness.  NO wheezing per se.  No body aches or headaches.  Using flonase, and otc medications for cough.   He states when ever he comes here for symptoms he gets a breathing treatment, and antibiotics.  He is asking for both today b/c he has h/o pneumonia and is "very proactive'.        Past Medical History:  Diagnosis Date   Bronchitis    Diabetes mellitus without complication (HCC)    GERD (gastroesophageal reflux disease)    History of 2019 novel coronavirus disease (COVID-19) 05/2019   Hypertension     Patient Active Problem List   Diagnosis Date Noted   Dental caries on pit and fissure surface penetrating into pulp 01/26/2023   Dental caries on smooth surface penetrating into dentin 01/26/2023   Muscle weakness (generalized) 01/26/2023   Diabetes mellitus type 2, controlled, without complications (HCC) 08/20/2022   Exposure to potentially hazardous substance 08/20/2022   Major depressive disorder 08/20/2022   Migraine 08/20/2022   Nightmare 08/20/2022   Onychomycosis due to dermatophyte 08/20/2022   Open angle with borderline findings, high risk, right eye 08/20/2022   Pain of left hip joint 08/20/2022   Problem related to unspecified psychosocial circumstances 08/20/2022   Vertigo 08/20/2022   Allergic rhinitis 12/19/2021   Anemia 12/19/2021   Anxiety 12/19/2021   COVID-19 12/19/2021   Disorder of refraction and accommodation 12/19/2021   Enthesopathy of ankle and tarsus 12/19/2021   Erectile dysfunction 12/19/2021   Hyperlipidemia 12/19/2021   Legal blindness, as defined in [place]  12/19/2021   Low back pain 12/19/2021   Major depressive disorder, recurrent, moderate (HCC) 12/19/2021   Meniere's disease, bilateral 12/19/2021   Hearing loss in right ear 11/27/2021   History of 2019 novel coronavirus disease (COVID-19) 09/19/2021   Onycholysis 09/19/2021   Peripheral neuropathy 09/19/2021   Plantar fascial fibromatosis 09/19/2021   Sleep apnea 09/19/2021   Abnormal gait 09/19/2021   Cataract 09/19/2021   Bunionette of left foot 09/19/2021   Chronic fatigue syndrome 09/19/2021   Chronic post-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 09/19/2021   Encounter for fitting and adjustment of spectacles and contact lenses 09/19/2021   Sinusitis, acute maxillary 01/08/2021   Diabetes mellitus type 2, uncontrolled, with complications 06/05/2019   Essential hypertension 06/05/2019   Pneumonia due to COVID-19 virus 06/03/2019   Type 2 diabetes mellitus (HCC) 06/03/2019   Fall 06/03/2019   Lumbar radiculopathy 03/03/2018   Diverticulosis of colon 09/09/2012   Hypertension associated with diabetes (HCC) 08/08/2012   GERD (gastroesophageal reflux disease) 08/08/2012    Past Surgical History:  Procedure Laterality Date   CATARACT EXTRACTION, BILATERAL  2018, 2019   WISDOM TOOTH EXTRACTION  05/2019       Home Medications    Prior to Admission medications   Medication Sig Start Date End Date Taking? Authorizing Provider  albuterol (VENTOLIN HFA) 108 (90 Base) MCG/ACT inhaler Inhale 2 puffs into the lungs every 6 (six) hours as needed for wheezing or shortness of breath. 06/05/22  Yes Raspet, Lyndon Chenoweth K, PA-C  amLODipine (NORVASC) 10 MG  tablet Take 1 tablet (10 mg total) by mouth every morning, for blood pressure. 04/28/23  Yes Georganna Skeans, MD  calcipotriene (DOVONOX) 0.005 % cream APPLY SMALL AMOUNT TOPICALLY 3 TIMES A DAY AS NEEDED FOR FEET 07/07/21  Yes [provider]  Calcipotriene 0.005 % solution Apply topically. 10/07/18  Yes [provider]  diclofenac Sodium (VOLTAREN) 1 %  GEL APPLY 4 GRAMS TOPICALLY FOUR TIMES A DAY AS NEEDED FOR HIP PAIN 02/13/21  Yes [provider]  fluticasone (FLONASE) 50 MCG/ACT nasal spray 1 spray in each nostril 09/03/20  Yes [provider]  lansoprazole (PREVACID SOLUTAB) 15 MG disintegrating tablet Take by mouth. 02/13/21  Yes [provider]  meclizine (ANTIVERT) 25 MG tablet 1 tablet as needed 11/27/21  Yes [provider]  metoprolol tartrate (LOPRESSOR) 25 MG tablet Take 1 tablet (25 mg total) by mouth 2 (two) times daily. 04/28/23  Yes Georganna Skeans, MD  valsartan-hydrochlorothiazide (DIOVAN-HCT) 160-12.5 MG tablet Take 1 tablet by mouth daily, for blood pressure. 04/28/23  Yes Georganna Skeans, MD  blood glucose meter kit and supplies KIT Check blood sugar three times daily 04/23/23     ciclopirox (PENLAC) 8 % solution Apply topically at bedtime. Apply over nail and surrounding skin. Apply daily over previous coat. After 7 days, may remove with alcohol and continue cycle. 03/05/23   Candelaria Stagers, DPM  glucose blood (ONETOUCH VERIO) test strip Check blood sugar three times daily 04/23/23     LIFESCAN FINEPOINT LANCETS MISC Check blood sugar three times daily 04/23/23       Family History Family History  Problem Relation Age of Onset   Diabetes Mother    GER disease Mother    Cancer Father        pancreas   Diabetes Sister    Diabetes Brother     Social History Social History   Tobacco Use   Smoking status: Never   Smokeless tobacco: Never  Vaping Use   Vaping status: Never Used  Substance Use Topics   Alcohol use: No   Drug use: No     Allergies   Penicillin g, Penicillins, Trazodone and nefazodone, Ativan [lorazepam], Atorvastatin, Decongestant [pseudoephedrine hcl er], Flunisolide, Lactose, Lisinopril, and Talwin [pentazocine]   Review of Systems Review of Systems  Constitutional: Negative.   HENT:  Positive for congestion and rhinorrhea.   Respiratory:  Positive for cough.  Negative for shortness of breath and wheezing.   Cardiovascular: Negative.   Gastrointestinal: Negative.   Genitourinary: Negative.   Musculoskeletal: Negative.   Psychiatric/Behavioral: Negative.       Physical Exam Triage Vital Signs ED Triage Vitals  Encounter Vitals Group     BP 09/22/23 0820 (!) 153/74     Systolic BP Percentile --      Diastolic BP Percentile --      Pulse Rate 09/22/23 0820 88     Resp 09/22/23 0820 18     Temp 09/22/23 0820 98.6 F (37 C)     Temp Source 09/22/23 0820 Oral     SpO2 09/22/23 0820 95 %     Weight --      Height --      Head Circumference --      Peak Flow --      Pain Score 09/22/23 0818 0     Pain Loc --      Pain Education --      Exclude from Growth Chart --    No data  found.  Updated Vital Signs BP (!) 153/74 (BP Location: Right Arm)   Pulse 88   Temp 98.6 F (37 C) (Oral)   Resp 18   SpO2 95%   Visual Acuity Right Eye Distance:   Left Eye Distance:   Bilateral Distance:    Right Eye Near:   Left Eye Near:    Bilateral Near:     Physical Exam Constitutional:      General: He is not in acute distress.    Appearance: Normal appearance. He is normal weight. He is not ill-appearing or toxic-appearing.  HENT:     Nose: Congestion present.     Mouth/Throat:     Mouth: Mucous membranes are moist.  Cardiovascular:     Rate and Rhythm: Normal rate and regular rhythm.  Pulmonary:     Effort: Pulmonary effort is normal.     Breath sounds: Rhonchi present. No wheezing or rales.  Abdominal:     Palpations: Abdomen is soft.  Musculoskeletal:     Cervical back: Normal range of motion and neck supple. No tenderness.  Lymphadenopathy:     Cervical: No cervical adenopathy.  Skin:    General: Skin is warm.  Neurological:     General: No focal deficit present.     Mental Status: He is alert.  Psychiatric:        Mood and Affect: Mood normal.      UC Treatments / Results  Labs (all labs ordered are listed, but  only abnormal results are displayed) Labs Reviewed - No data to display  EKG   Radiology No results found.  Procedures Procedures (including critical care time)  Medications Ordered in UC Medications  albuterol (PROVENTIL) (2.5 MG/3ML) 0.083% nebulizer solution 2.5 mg (has no administration in time range)    Initial Impression / Assessment and Plan / UC Course  I have reviewed the triage vital signs and the nursing notes.  Pertinent labs & imaging results that were available during my care of the patient were reviewed by me and considered in my medical decision making (see chart for details).    Patient is here for 3 days of sinus congestion, mild cough.  No wheezing or sob.  He is asking for a breathing treatment and antibiotic today as "I always get that when I come here and it helps.  I have h/o pneumonia and want to be proactive".  Discussed that his symptoms appear viral, and without wheezing or sob I don't think either is warranted at this time.  However, I will send a script to the pharmacy, and ask that he not fill this for several days, unless he is worsening or not improving.   Final Clinical Impressions(s) / UC Diagnoses   Final diagnoses:  Viral URI with cough     Discharge Instructions      You were seen today for upper respiratory symptoms.  I have given you a breathing treatment today.  I have sent out a cough medication.  You may continue the flonase as needed for the sinus issues.  I have sent out a liquid antibiotic for you.  As discussed, do not start this at this time, as you have no signs of a bacterial infection.  Please monitor your symptoms and fill only if you are not improving by Sunday.     ED Prescriptions     Medication Sig Dispense Auth. Provider   promethazine-dextromethorphan (PROMETHAZINE-DM) 6.25-15 MG/5ML syrup Take 5 mLs by mouth 4 (four) times daily  as needed for cough. 118 mL Zuriel Roskos, MD   azithromycin (ZITHROMAX) 200 MG/5ML  suspension 12 ml po on day 1, then 6ml po on day 2-5 30 mL Rickia Freeburg, MD      PDMP not reviewed this encounter.   Jannifer Franklin, MD 09/22/23 [phone]

## 2023-10-08 ENCOUNTER — Other Ambulatory Visit (HOSPITAL_COMMUNITY): Payer: Self-pay

## 2023-10-26 ENCOUNTER — Encounter: Payer: Self-pay | Admitting: Family Medicine

## 2023-10-26 ENCOUNTER — Ambulatory Visit: Payer: No Typology Code available for payment source | Admitting: Family Medicine

## 2023-10-26 VITALS — BP 132/66 | HR 78 | Temp 98.0°F | Resp 16 | Ht 68.0 in | Wt 164.0 lb

## 2023-10-26 DIAGNOSIS — I10 Essential (primary) hypertension: Secondary | ICD-10-CM | POA: Diagnosis not present

## 2023-10-26 DIAGNOSIS — E1142 Type 2 diabetes mellitus with diabetic polyneuropathy: Secondary | ICD-10-CM

## 2023-10-26 DIAGNOSIS — E785 Hyperlipidemia, unspecified: Secondary | ICD-10-CM | POA: Diagnosis not present

## 2023-10-26 LAB — POCT GLYCOSYLATED HEMOGLOBIN (HGB A1C): Hemoglobin A1C: 7.1 % — AB (ref 4.0–5.6)

## 2023-10-27 LAB — RENAL FUNCTION PANEL
Albumin: 3.9 g/dL (ref 3.8–4.8)
BUN/Creatinine Ratio: 13 (ref 10–24)
BUN: 17 mg/dL (ref 8–27)
CO2: 26 mmol/L (ref 20–29)
Calcium: 9.2 mg/dL (ref 8.6–10.2)
Chloride: 101 mmol/L (ref 96–106)
Creatinine, Ser: 1.28 mg/dL — ABNORMAL HIGH (ref 0.76–1.27)
Glucose: 198 mg/dL — ABNORMAL HIGH (ref 70–99)
Phosphorus: 2.6 mg/dL — ABNORMAL LOW (ref 2.8–4.1)
Potassium: 4.6 mmol/L (ref 3.5–5.2)
Sodium: 141 mmol/L (ref 134–144)
eGFR: 58 mL/min/{1.73_m2} — ABNORMAL LOW (ref >59)

## 2023-10-27 LAB — LIPID PANEL
Chol/HDL Ratio: 3.5 ratio (ref 0.0–5.0)
Cholesterol, Total: 188 mg/dL (ref 100–199)
HDL: 53 mg/dL (ref >39)
LDL Chol Calc (NIH): 116 mg/dL — ABNORMAL HIGH (ref 0–99)
Triglycerides: 107 mg/dL (ref 0–149)
VLDL Cholesterol Cal: 19 mg/dL (ref 5–40)

## 2023-10-27 LAB — MICROALBUMIN / CREATININE URINE RATIO
Creatinine, Urine: 229.6 mg/dL
Microalb/Creat Ratio: 2 mg/g{creat} (ref 0–29)
Microalbumin, Urine: 4.3 ug/mL

## 2023-10-28 ENCOUNTER — Encounter: Payer: Self-pay | Admitting: Family Medicine

## 2023-10-28 NOTE — Progress Notes (Signed)
 Established Patient Office Visit  Subjective    Patient ID: Ethan Pierce, male    DOB: 05-08-1946  Age: 78 y.o. MRN: [id number]  CC:  Chief Complaint  Patient presents with   Annual Exam    Blood work    HPI Ethan Pierce presents for routine follow up of chronic med issues including diabetes, hypertension, and hyperlipidemia. Patient reports med compliance and denies acute complaints.   Outpatient Encounter Medications as of 10/26/2023  Medication Sig   albuterol (VENTOLIN HFA) 108 (90 Base) MCG/ACT inhaler Inhale 2 puffs into the lungs every 6 (six) hours as needed for wheezing or shortness of breath.   amLODipine (NORVASC) 10 MG tablet Take 1 tablet (10 mg total) by mouth every morning, for blood pressure.   azithromycin (ZITHROMAX) 200 MG/5ML suspension Take 12 ml by mouth on day 1, then 6ml by mouth on day 2-5. Discard remainder.   blood glucose meter kit and supplies KIT Check blood sugar three times daily   calcipotriene (DOVONOX) 0.005 % cream APPLY SMALL AMOUNT TOPICALLY 3 TIMES A DAY AS NEEDED FOR FEET   Calcipotriene 0.005 % solution Apply topically.   diclofenac Sodium (VOLTAREN) 1 % GEL APPLY 4 GRAMS TOPICALLY FOUR TIMES A DAY AS NEEDED FOR HIP PAIN   fluticasone (FLONASE) 50 MCG/ACT nasal spray 1 spray in each nostril   glucose blood (ONETOUCH VERIO) test strip Check blood sugar three times daily   lansoprazole (PREVACID SOLUTAB) 15 MG disintegrating tablet Take by mouth.   LIFESCAN FINEPOINT LANCETS MISC Check blood sugar three times daily   meclizine (ANTIVERT) 25 MG tablet 1 tablet as needed   metoprolol tartrate (LOPRESSOR) 25 MG tablet Take 1 tablet (25 mg total) by mouth 2 (two) times daily.   promethazine-dextromethorphan (PROMETHAZINE-DM) 6.25-15 MG/5ML syrup Take 5 mLs by mouth 4 (four) times daily as needed for cough.   valsartan-hydrochlorothiazide (DIOVAN-HCT) 160-12.5 MG tablet Take 1 tablet by mouth daily, for blood pressure.   No  facility-administered encounter medications on file as of 10/26/2023.    Past Medical History:  Diagnosis Date   Bronchitis    Diabetes mellitus without complication (HCC)    GERD (gastroesophageal reflux disease)    History of 2019 novel coronavirus disease (COVID-19) 05/2019   Hypertension     Past Surgical History:  Procedure Laterality Date   CATARACT EXTRACTION, BILATERAL  2018, 2019   WISDOM TOOTH EXTRACTION  05/2019    Family History  Problem Relation Age of Onset   Diabetes Mother    GER disease Mother    Cancer Father        pancreas   Diabetes Sister    Diabetes Brother     Social History   Socioeconomic History   Marital status: Married    Spouse name: Not on file   Number of children: 2   Years of education: Not on file   Highest education level: Doctorate  Occupational History   Not on file  Tobacco Use   Smoking status: Never   Smokeless tobacco: Never  Vaping Use   Vaping status: Never Used  Substance and Sexual Activity   Alcohol use: No   Drug use: No   Sexual activity: Yes  Other Topics Concern   Not on file  Social History Narrative   Not on file   Social Drivers of Health   Financial Resource Strain: Low Risk  (10/26/2023)   Overall Financial Resource Strain (CARDIA)    Difficulty of Paying Living  Expenses: Not hard at all  Food Insecurity: No Food Insecurity (10/26/2023)   Hunger Vital Sign    Worried About Running Out of Food in the Last Year: Never true    Ran Out of Food in the Last Year: Never true  Transportation Needs: No Transportation Needs (10/26/2023)   PRAPARE - Administrator, Civil Service (Medical): No    Lack of Transportation (Non-Medical): No  Physical Activity: Inactive (10/26/2023)   Exercise Vital Sign    Days of Exercise per Week: 0 days    Minutes of Exercise per Session: 0 min  Stress: No Stress Concern Present (10/26/2023)   [name] of Occupational Health - Occupational Stress  Questionnaire    Feeling of Stress : Not at all  Social Connections: Socially Integrated (10/26/2023)   Social Connection and Isolation Panel [NHANES]    Frequency of Communication with Friends and Family: More than three times a week    Frequency of Social Gatherings with Friends and Family: More than three times a week    Attends Religious Services: More than 4 times per year    Active Member of [employer] or Organizations: Yes    Attends Banker Meetings: 1 to 4 times per year    Marital Status: Married  Catering manager Violence: Not At Risk (10/26/2023)   Humiliation, Afraid, Rape, and Kick questionnaire    Fear of Current or Ex-Partner: No    Emotionally Abused: No    Physically Abused: No    Sexually Abused: No    Review of Systems  All other systems reviewed and are negative.       Objective    BP 132/66   Pulse 78   Temp 98 F (36.7 C) (Oral)   Resp 16   Ht 5\' 8"  (1.727 m)   Wt 164 lb (74.4 kg)   SpO2 97%   BMI 24.94 kg/m   Physical Exam Vitals and nursing note reviewed.  Constitutional:      General: He is not in acute distress. Cardiovascular:     Rate and Rhythm: Normal rate and regular rhythm.  Pulmonary:     Effort: Pulmonary effort is normal.     Breath sounds: Normal breath sounds.  Abdominal:     Palpations: Abdomen is soft.     Tenderness: There is no abdominal tenderness.  Skin:    Findings: No bruising.  Neurological:     General: No focal deficit present.     Mental Status: He is alert and oriented to person, place, and time.         Assessment & Plan:   Diabetic peripheral neuropathy associated with type 2 diabetes mellitus (HCC) -     POCT glycosylated hemoglobin (Hb A1C) -     Renal function panel -     Microalbumin / creatinine urine ratio  Essential hypertension -     Lipid panel  Hyperlipidemia, unspecified hyperlipidemia type -     Lipid panel     No follow-ups on file.   [name], MD

## 2023-12-08 ENCOUNTER — Ambulatory Visit: Payer: No Typology Code available for payment source | Admitting: Podiatry

## 2023-12-08 ENCOUNTER — Other Ambulatory Visit: Payer: No Typology Code available for payment source

## 2023-12-14 ENCOUNTER — Encounter: Payer: Self-pay | Admitting: Podiatry

## 2023-12-14 ENCOUNTER — Ambulatory Visit (INDEPENDENT_AMBULATORY_CARE_PROVIDER_SITE_OTHER): Admitting: Podiatry

## 2023-12-14 DIAGNOSIS — M79674 Pain in right toe(s): Secondary | ICD-10-CM

## 2023-12-14 DIAGNOSIS — M79675 Pain in left toe(s): Secondary | ICD-10-CM | POA: Diagnosis not present

## 2023-12-14 DIAGNOSIS — B351 Tinea unguium: Secondary | ICD-10-CM

## 2023-12-14 DIAGNOSIS — E1142 Type 2 diabetes mellitus with diabetic polyneuropathy: Secondary | ICD-10-CM | POA: Diagnosis not present

## 2023-12-14 NOTE — Progress Notes (Signed)
 This patient returns to my office for at risk foot care.  This patient requires this care by a professional since this patient will be at risk due to having diabetes with neuropathy. This patient is unable to cut nails himself since the patient cannot reach his nails.These nails are painful walking and wearing shoes.  This patient presents for at risk foot care today.  General Appearance  Alert, conversant and in no acute stress.  Vascular  Dorsalis pedis and posterior tibial  pulses are palpable  bilaterally.  Capillary return is within normal limits  bilaterally. Temperature is within normal limits  bilaterally.  Neurologic  Senn-Weinstein monofilament wire test diminished  bilaterally. Muscle power within normal limits bilaterally.  Nails Thick disfigured discolored nails with subungual debris  from hallux to fifth toes bilaterally. No evidence of bacterial infection or drainage bilaterally.  Orthopedic  No limitations of motion  feet .  No crepitus or effusions noted.  Pes planus  B/L.  Tailors bunion.  Skin  normotropic skin with no porokeratosis noted bilaterally.  No signs of infections or ulcers noted.     Onychomycosis  Pain in right toes  Pain in left toes  Consent was obtained for treatment procedures.   Mechanical debridement of nails 1-5  bilaterally performed with a nail nipper.  Filed with dremel without incident.    Return office visit      3 months                Told patient to return for periodic foot care and evaluation due to potential at risk complications.   Ruffin Cotton DPM

## 2023-12-16 DIAGNOSIS — H2513 Age-related nuclear cataract, bilateral: Secondary | ICD-10-CM | POA: Diagnosis not present

## 2023-12-22 ENCOUNTER — Telehealth: Payer: Self-pay | Admitting: *Deleted

## 2023-12-22 NOTE — Telephone Encounter (Signed)
 Copied from CRM [id number]. Topic: Referral - Request for Referral >> Dec 21, 2023  1:38 PM Loreda Rodriguez T wrote: Did the patient discuss referral with their provider in the last year? Yes Appointment offered? No  Type of order/referral and detailed reason for visit: Physical Therapy, pain in left arm  Preference of office, provider, location: unlnown  If referral order, have you been seen by this specialty before? No (If Yes, this issue or another issue? When? Where?  Can we respond through MyChart? No

## 2023-12-23 ENCOUNTER — Ambulatory Visit: Payer: Self-pay

## 2023-12-23 ENCOUNTER — Telehealth: Payer: Self-pay | Admitting: Family Medicine

## 2023-12-23 NOTE — Telephone Encounter (Signed)
 error

## 2023-12-23 NOTE — Telephone Encounter (Signed)
 Copied from CRM [id number]. Topic: Clinical - Red Word Triage >> Dec 23, 2023 11:05 AM Hassie Lint wrote: [facility] that prompted transfer to Nurse Triage: Patient is experiencing severe pain in his left arm for the last two months. Difficulty sleeping at night and has trouble dressing himself.   Chief Complaint: Left arm pain Symptoms: pain with movement Frequency: constant Pertinent Negatives: Patient denies fall or new injury Disposition: [] ED /[] Urgent Care (no appt availability in office) / [] Appointment(In office/virtual)/ []  [place] [facility]/ [] Home Care/ [] Refused Recommended Disposition /[] [place] [facility]/ []  Follow-up with PCP Additional Notes: Per wife, pt has left arm pain x 2 months. He was seen for this by his [place] doctor and an xray was order. Per  wife the xray was negative. She also reports that they have been waiting on a PT referral. No referrals seen pending in chart review.  No appt avail within next 3 days as needed per protocol, RN advising UC for pain control.  Patients wife said okay and disconnected the call.    Reason for Disposition  [1] MODERATE pain (e.g., interferes with normal activities) AND [2] present > 3 days  Answer Assessment - Initial Assessment Questions 1. ONSET: "When did the pain start?"     2 months  2. LOCATION: "Where is the pain located?"     Left arm, near top shoulder down to elbow  3. PAIN: "How bad is the pain?" (Scale 1-10; or mild, moderate, severe)   - MILD (1-3): Doesn't interfere with normal activities.   - MODERATE (4-7): Interferes with normal activities (e.g., work or school) or awakens from sleep.   - SEVERE (8-10): Excruciating pain, unable to do any normal activities, unable to hold a cup of water.     Moderate to severe pain, pain wakes him up out of sleep and he needs help getting dress  4. WORK OR EXERCISE: "Has there been any recent work or exercise that involved this part of the body?"     No  5. CAUSE: "What do  you think is causing the arm pain?"     Unsure of cause  6. OTHER SYMPTOMS: "Do you have any other symptoms?" (e.g., neck pain, swelling, rash, fever, numbness, weakness)     Muscle loss, weakness  Protocols used: Arm Pain-A-AH

## 2023-12-24 ENCOUNTER — Other Ambulatory Visit (HOSPITAL_COMMUNITY): Payer: Self-pay

## 2023-12-24 ENCOUNTER — Other Ambulatory Visit: Payer: Self-pay | Admitting: Family Medicine

## 2023-12-24 DIAGNOSIS — M25512 Pain in left shoulder: Secondary | ICD-10-CM | POA: Diagnosis not present

## 2023-12-24 MED ORDER — MELOXICAM 7.5 MG PO TABS
7.5000 mg | ORAL_TABLET | Freq: Every day | ORAL | 0 refills | Status: DC
  Filled 2023-12-24: qty 10, 10d supply, fill #0

## 2023-12-24 NOTE — Telephone Encounter (Signed)
 Noted

## 2023-12-24 NOTE — Telephone Encounter (Signed)
Pt scheduled for OV.  

## 2023-12-24 NOTE — Telephone Encounter (Signed)
 Patient schedule for 01/19/2024

## 2023-12-25 ENCOUNTER — Other Ambulatory Visit (HOSPITAL_COMMUNITY): Payer: Self-pay

## 2023-12-25 ENCOUNTER — Other Ambulatory Visit: Payer: Self-pay | Admitting: Family Medicine

## 2023-12-28 ENCOUNTER — Other Ambulatory Visit (HOSPITAL_COMMUNITY): Payer: Self-pay

## 2023-12-28 MED ORDER — VALSARTAN-HYDROCHLOROTHIAZIDE 160-12.5 MG PO TABS
1.0000 | ORAL_TABLET | Freq: Every day | ORAL | 1 refills | Status: DC
  Filled 2023-12-28: qty 90, 90d supply, fill #0
  Filled 2024-03-27: qty 90, 90d supply, fill #1

## 2024-01-17 DIAGNOSIS — M25512 Pain in left shoulder: Secondary | ICD-10-CM | POA: Diagnosis not present

## 2024-01-19 ENCOUNTER — Ambulatory Visit: Admitting: Family Medicine

## 2024-01-19 VITALS — BP 148/70 | HR 97

## 2024-01-19 DIAGNOSIS — E1142 Type 2 diabetes mellitus with diabetic polyneuropathy: Secondary | ICD-10-CM | POA: Diagnosis not present

## 2024-01-19 DIAGNOSIS — E785 Hyperlipidemia, unspecified: Secondary | ICD-10-CM

## 2024-01-19 DIAGNOSIS — I10 Essential (primary) hypertension: Secondary | ICD-10-CM | POA: Diagnosis not present

## 2024-01-19 NOTE — Progress Notes (Unsigned)
 Established Patient Office Visit  Subjective    Patient ID: Ethan Pierce, male    DOB: Aug 12, 1945  Age: 78 y.o. MRN: [id number]  CC:  Chief Complaint  Patient presents with   Medical Management of Chronic Issues    HPI Ethan Pierce presents for routine follow up of blood glucose as he had a recent steroid injection.   Outpatient Encounter Medications as of 01/19/2024  Medication Sig   albuterol  (VENTOLIN  HFA) 108 (90 Base) MCG/ACT inhaler Inhale 2 puffs into the lungs every 6 (six) hours as needed for wheezing or shortness of breath.   amLODipine  (NORVASC ) 10 MG tablet Take 1 tablet (10 mg total) by mouth every morning, for blood pressure.   azithromycin  (ZITHROMAX ) 200 MG/5ML suspension Take 12 ml by mouth on day 1, then 6ml by mouth on day 2-5. Discard remainder.   blood glucose meter kit and supplies KIT Check blood sugar three times daily   calcipotriene  (DOVONOX) 0.005 % cream APPLY SMALL AMOUNT TOPICALLY 3 TIMES A DAY AS NEEDED FOR FEET   Calcipotriene  0.005 % solution Apply topically.   diclofenac Sodium (VOLTAREN) 1 % GEL APPLY 4 GRAMS TOPICALLY FOUR TIMES A DAY AS NEEDED FOR HIP PAIN   fluticasone (FLONASE) 50 MCG/ACT nasal spray 1 spray in each nostril   glucose blood (ONETOUCH VERIO) test strip Check blood sugar three times daily   lansoprazole (PREVACID SOLUTAB) 15 MG disintegrating tablet Take by mouth.   LIFESCAN FINEPOINT LANCETS MISC Check blood sugar three times daily   meclizine (ANTIVERT) 25 MG tablet 1 tablet as needed   meloxicam  (MOBIC ) 7.5 MG tablet Take 1 tablet (7.5 mg total) by mouth daily.   metoprolol  tartrate (LOPRESSOR ) 25 MG tablet Take 1 tablet (25 mg total) by mouth 2 (two) times daily.   promethazine -dextromethorphan (PROMETHAZINE -DM) 6.25-15 MG/5ML syrup Take 5 mLs by mouth 4 (four) times daily as needed for cough.   valsartan -hydrochlorothiazide  (DIOVAN -HCT) 160-12.5 MG tablet Take 1 tablet by mouth daily, for blood pressure.   No  facility-administered encounter medications on file as of 01/19/2024.    Past Medical History:  Diagnosis Date   Bronchitis    Diabetes mellitus without complication (HCC)    GERD (gastroesophageal reflux disease)    History of 2019 novel coronavirus disease (COVID-19) 05/2019   Hypertension     Past Surgical History:  Procedure Laterality Date   CATARACT EXTRACTION, BILATERAL  2018, 2019   WISDOM TOOTH EXTRACTION  05/2019    Family History  Problem Relation Age of Onset   Diabetes Mother    GER disease Mother    Cancer Father        pancreas   Diabetes Sister    Diabetes Brother     Social History   Socioeconomic History   Marital status: Married    Spouse name: Not on file   Number of children: 2   Years of education: Not on file   Highest education level: Doctorate  Occupational History   Not on file  Tobacco Use   Smoking status: Never   Smokeless tobacco: Never  Vaping Use   Vaping status: Never Used  Substance and Sexual Activity   Alcohol use: No   Drug use: No   Sexual activity: Yes  Other Topics Concern   Not on file  Social History Narrative   Not on file   Social Drivers of Health   Financial Resource Strain: Low Risk  (10/26/2023)   Overall Financial Resource Strain (CARDIA)  Difficulty of Paying Living Expenses: Not hard at all  Food Insecurity: No Food Insecurity (10/26/2023)   Hunger Vital Sign    Worried About Running Out of Food in the Last Year: Never true    Ran Out of Food in the Last Year: Never true  Transportation Needs: No Transportation Needs (10/26/2023)   PRAPARE - Administrator, Civil Service (Medical): No    Lack of Transportation (Non-Medical): No  Physical Activity: Inactive (10/26/2023)   Exercise Vital Sign    Days of Exercise per Week: 0 days    Minutes of Exercise per Session: 0 min  Stress: No Stress Concern Present (10/26/2023)   [name] of Occupational Health - Occupational Stress  Questionnaire    Feeling of Stress : Not at all  Social Connections: Socially Integrated (10/26/2023)   Social Connection and Isolation Panel    Frequency of Communication with Friends and Family: More than three times a week    Frequency of Social Gatherings with Friends and Family: More than three times a week    Attends Religious Services: More than 4 times per year    Active Member of [employer] or Organizations: Yes    Attends Banker Meetings: 1 to 4 times per year    Marital Status: Married  Catering manager Violence: Not At Risk (10/26/2023)   Humiliation, Afraid, Rape, and Kick questionnaire    Fear of Current or Ex-Partner: No    Emotionally Abused: No    Physically Abused: No    Sexually Abused: No    Review of Systems  All other systems reviewed and are negative.       Objective    BP (!) 148/70   Pulse 97   SpO2 97%   Physical Exam Vitals and nursing note reviewed.  Constitutional:      General: He is not in acute distress.  Cardiovascular:     Rate and Rhythm: Normal rate and regular rhythm.  Pulmonary:     Effort: Pulmonary effort is normal.     Breath sounds: Normal breath sounds.  Abdominal:     Palpations: Abdomen is soft.     Tenderness: There is no abdominal tenderness.   Skin:    Findings: No bruising.   Neurological:     General: No focal deficit present.     Mental Status: He is alert and oriented to person, place, and time.     {Labs (Optional):23779}    Assessment & Plan:   There are no diagnoses linked to this encounter.   No follow-ups on file.   Ethan Lama, MD

## 2024-01-21 ENCOUNTER — Encounter: Payer: Self-pay | Admitting: Family Medicine

## 2024-01-24 ENCOUNTER — Inpatient Hospital Stay: Admitting: Family Medicine

## 2024-03-15 ENCOUNTER — Encounter: Payer: Self-pay | Admitting: Podiatry

## 2024-03-15 ENCOUNTER — Ambulatory Visit: Admitting: Podiatry

## 2024-03-15 DIAGNOSIS — M79674 Pain in right toe(s): Secondary | ICD-10-CM

## 2024-03-15 DIAGNOSIS — B351 Tinea unguium: Secondary | ICD-10-CM | POA: Diagnosis not present

## 2024-03-15 DIAGNOSIS — M79675 Pain in left toe(s): Secondary | ICD-10-CM | POA: Diagnosis not present

## 2024-03-15 DIAGNOSIS — E1142 Type 2 diabetes mellitus with diabetic polyneuropathy: Secondary | ICD-10-CM

## 2024-03-15 NOTE — Progress Notes (Addendum)
 This patient returns to my office for at risk foot care.  This patient requires this care by a professional since this patient will be at risk due to having diabetes with neuropathy. This patient is unable to cut nails himself since the patient cannot reach his nails.These nails are painful walking and wearing shoes.  This patient presents for at risk foot care today.  General Appearance  Alert, conversant and in no acute stress.  Vascular  Dorsalis pedis and posterior tibial  pulses are palpable  bilaterally.  Capillary return is within normal limits  bilaterally. Temperature is within normal limits  bilaterally.  Neurologic  Senn-Weinstein monofilament wire test diminished  bilaterally. Muscle power within normal limits bilaterally.  Nails Thick disfigured discolored nails with subungual debris  from hallux to fifth toes bilaterally. No evidence of bacterial infection or drainage bilaterally.  Orthopedic  No limitations of motion  feet .  No crepitus or effusions noted.  Pes planus  B/L.  Tailors bunion.  Skin  normotropic skin with no porokeratosis noted bilaterally.  No signs of infections or ulcers noted.     Onychomycosis  Pain in right toes  Pain in left toes  Consent was obtained for treatment procedures.   Mechanical debridement of nails 1-5  bilaterally performed with a nail nipper.  Filed with dremel without incident.    Return office visit      3 months                Told patient to return for periodic foot care and evaluation due to potential at risk complications.   Cordella Bold DPM tomma

## 2024-03-27 ENCOUNTER — Other Ambulatory Visit (HOSPITAL_COMMUNITY): Payer: Self-pay

## 2024-04-20 ENCOUNTER — Encounter: Payer: Self-pay | Admitting: Family Medicine

## 2024-04-20 ENCOUNTER — Ambulatory Visit (INDEPENDENT_AMBULATORY_CARE_PROVIDER_SITE_OTHER): Admitting: Family Medicine

## 2024-04-20 VITALS — BP 143/71 | HR 75 | Ht 68.0 in | Wt 157.0 lb

## 2024-04-20 DIAGNOSIS — Z0000 Encounter for general adult medical examination without abnormal findings: Secondary | ICD-10-CM | POA: Diagnosis not present

## 2024-04-20 DIAGNOSIS — Z1329 Encounter for screening for other suspected endocrine disorder: Secondary | ICD-10-CM | POA: Diagnosis not present

## 2024-04-20 DIAGNOSIS — Z13228 Encounter for screening for other metabolic disorders: Secondary | ICD-10-CM | POA: Diagnosis not present

## 2024-04-20 DIAGNOSIS — Z136 Encounter for screening for cardiovascular disorders: Secondary | ICD-10-CM

## 2024-04-20 DIAGNOSIS — Z130 Encounter for screening for diseases of the blood and blood-forming organs and certain disorders involving the immune mechanism: Secondary | ICD-10-CM

## 2024-04-20 NOTE — Progress Notes (Signed)
 Established Patient Office Visit  Subjective    Patient ID: Ethan Pierce, male    DOB: [birth year]  Age: 78 y.o. MRN: [id number]  CC:  Chief Complaint  Patient presents with   Medical Management of Chronic Issues    Pt also wants flu vaccine     HPI Ethan Pierce presents for routine annual exam. Patient denies acute complaints.   Outpatient Encounter Medications as of 04/20/2024  Medication Sig   albuterol  (VENTOLIN  HFA) 108 (90 Base) MCG/ACT inhaler Inhale 2 puffs into the lungs every 6 (six) hours as needed for wheezing or shortness of breath.   amLODipine  (NORVASC ) 10 MG tablet Take 1 tablet (10 mg total) by mouth every morning, for blood pressure.   blood glucose meter kit and supplies KIT Check blood sugar three times daily   calcipotriene  (DOVONOX) 0.005 % cream APPLY SMALL AMOUNT TOPICALLY 3 TIMES A DAY AS NEEDED FOR FEET   diclofenac Sodium (VOLTAREN) 1 % GEL APPLY 4 GRAMS TOPICALLY FOUR TIMES A DAY AS NEEDED FOR HIP PAIN   fluticasone (FLONASE) 50 MCG/ACT nasal spray 1 spray in each nostril   glucose blood (ONETOUCH VERIO) test strip Check blood sugar three times daily   lansoprazole (PREVACID SOLUTAB) 15 MG disintegrating tablet Take by mouth.   LIFESCAN FINEPOINT LANCETS MISC Check blood sugar three times daily   meclizine (ANTIVERT) 25 MG tablet 1 tablet as needed   metoprolol  tartrate (LOPRESSOR ) 25 MG tablet Take 1 tablet (25 mg total) by mouth 2 (two) times daily.   valsartan -hydrochlorothiazide  (DIOVAN -HCT) 160-12.5 MG tablet Take 1 tablet by mouth daily, for blood pressure.   azithromycin  (ZITHROMAX ) 200 MG/5ML suspension Take 12 ml by mouth on day 1, then 6ml by mouth on day 2-5. Discard remainder. (Patient not taking: Reported on 04/20/2024)   Calcipotriene  0.005 % solution Apply topically.   meloxicam  (MOBIC ) 7.5 MG tablet Take 1 tablet (7.5 mg total) by mouth daily.   promethazine -dextromethorphan (PROMETHAZINE -DM) 6.25-15 MG/5ML syrup Take 5 mLs by mouth 4  (four) times daily as needed for cough. (Patient not taking: Reported on 04/20/2024)   No facility-administered encounter medications on file as of 04/20/2024.    Past Medical History:  Diagnosis Date   Bronchitis    Diabetes mellitus without complication (HCC)    GERD (gastroesophageal reflux disease)    History of 2019 novel coronavirus disease (COVID-19) 05/2019   Hypertension     Past Surgical History:  Procedure Laterality Date   CATARACT EXTRACTION, BILATERAL  2018, 2019   WISDOM TOOTH EXTRACTION  05/2019    Family History  Problem Relation Age of Onset   Diabetes Mother    GER disease Mother    Cancer Father        pancreas   Diabetes Sister    Diabetes Brother     Social History   Socioeconomic History   Marital status: Married    Spouse name: Not on file   Number of children: 2   Years of education: Not on file   Highest education level: Doctorate  Occupational History   Not on file  Tobacco Use   Smoking status: Never   Smokeless tobacco: Never  Vaping Use   Vaping status: Never Used  Substance and Sexual Activity   Alcohol use: No   Drug use: No   Sexual activity: Yes  Other Topics Concern   Not on file  Social History Narrative   Not on file   Social Drivers of Health  Financial Resource Strain: Low Risk  (10/26/2023)   Overall Financial Resource Strain (CARDIA)    Difficulty of Paying Living Expenses: Not hard at all  Food Insecurity: No Food Insecurity (10/26/2023)   Hunger Vital Sign    Worried About Running Out of Food in the Last Year: Never true    Ran Out of Food in the Last Year: Never true  Transportation Needs: No Transportation Needs (10/26/2023)   PRAPARE - Administrator, Civil Service (Medical): No    Lack of Transportation (Non-Medical): No  Physical Activity: Inactive (10/26/2023)   Exercise Vital Sign    Days of Exercise per Week: 0 days    Minutes of Exercise per Session: 0 min  Stress: No Stress Concern  Present (10/26/2023)   [name] of Occupational Health - Occupational Stress Questionnaire    Feeling of Stress : Not at all  Social Connections: Socially Integrated (10/26/2023)   Social Connection and Isolation Panel    Frequency of Communication with Friends and Family: More than three times a week    Frequency of Social Gatherings with Friends and Family: More than three times a week    Attends Religious Services: More than 4 times per year    Active Member of [employer] or Organizations: Yes    Attends Banker Meetings: 1 to 4 times per year    Marital Status: Married  Catering manager Violence: Not At Risk (10/26/2023)   Humiliation, Afraid, Rape, and Kick questionnaire    Fear of Current or Ex-Partner: No    Emotionally Abused: No    Physically Abused: No    Sexually Abused: No    Review of Systems  All other systems reviewed and are negative.       Objective    BP (!) 143/71   Pulse 75   Ht 5' 8 (1.727 m)   Wt 157 lb (71.2 kg)   SpO2 97%   BMI 23.87 kg/m   Physical Exam Vitals and nursing note reviewed.  Constitutional:      General: He is not in acute distress. HENT:     Head: Normocephalic and atraumatic.     Right Ear: Tympanic membrane, ear canal and external ear normal.     Left Ear: Tympanic membrane, ear canal and external ear normal.     Nose: Nose normal.     Mouth/Throat:     Mouth: Mucous membranes are moist.     Pharynx: Oropharynx is clear.  Eyes:     Conjunctiva/sclera: Conjunctivae normal.     Pupils: Pupils are equal, round, and reactive to light.  Neck:     Thyroid: No thyromegaly.  Cardiovascular:     Rate and Rhythm: Normal rate and regular rhythm.     Heart sounds: Normal heart sounds. No murmur heard. Pulmonary:     Effort: Pulmonary effort is normal.     Breath sounds: Normal breath sounds.  Abdominal:     General: There is no distension.     Palpations: Abdomen is soft. There is no mass.     Tenderness: There  is no abdominal tenderness.     Hernia: There is no hernia in the left inguinal area or right inguinal area.  Musculoskeletal:        General: Normal range of motion.     Cervical back: Normal range of motion and neck supple.     Right lower leg: No edema.     Left lower leg: No edema.  Skin:    General: Skin is warm and dry.  Neurological:     General: No focal deficit present.     Mental Status: He is alert and oriented to person, place, and time. Mental status is at baseline.  Psychiatric:        Mood and Affect: Mood normal.        Behavior: Behavior normal.         Assessment & Plan:   Annual physical exam -     Comprehensive metabolic panel with GFR  Screening for deficiency anemia -     CBC with Differential/Platelet  Encounter for screening for cardiovascular disorders -     Lipid panel  Screening for endocrine/metabolic/immunity disorders -     Hemoglobin A1c -     VITAMIN D 25 Hydroxy (Vit-D Deficiency, Fractures)     Return if symptoms worsen or fail to improve.   Tanda Raguel SQUIBB, MD

## 2024-04-24 ENCOUNTER — Encounter: Payer: Self-pay | Admitting: Family Medicine

## 2024-04-24 NOTE — Progress Notes (Signed)
 This encounter was created in error - please disregard.

## 2024-05-08 ENCOUNTER — Ambulatory Visit (INDEPENDENT_AMBULATORY_CARE_PROVIDER_SITE_OTHER)

## 2024-05-08 VITALS — Ht 68.0 in | Wt 157.0 lb

## 2024-05-08 DIAGNOSIS — Z0000 Encounter for general adult medical examination without abnormal findings: Secondary | ICD-10-CM

## 2024-05-08 NOTE — Patient Instructions (Addendum)
 Ethan Pierce,  Thank you for taking the time for your Medicare Wellness Visit. I appreciate your continued commitment to your health goals. Please review the care plan we discussed, and feel free to reach out if I can assist you further.  Medicare recommends these wellness visits once per year to help you and your care team stay ahead of potential health issues. These visits are designed to focus on prevention, allowing your provider to concentrate on managing your acute and chronic conditions during your regular appointments.  Please note that Annual Wellness Visits do not include a physical exam. Some assessments may be limited, especially if the visit was conducted virtually. If needed, we may recommend a separate in-person follow-up with your provider.  Ongoing Care Seeing your primary care provider every 3 to 6 months helps us  monitor your health and provide consistent, personalized care. Next office visit on 3/25/206.  Last office visit on 04/20/2024.  You are due for a Flu vaccine and can get that done at your local pharmacy.  You are also due for a A1c check, a foot exam and you can get these done during your next office visit with Dr. Tanda.    Referrals If a referral was made during today's visit and you haven't received any updates within two weeks, please contact the referred provider directly to check on the status.  Recommended Screenings:  Health Maintenance  Topic Date Due   Medicare Annual Wellness Visit  Never done   Complete foot exam   09/19/2022   Eye exam for diabetics  10/12/2023   COVID-19 Vaccine (2 - 2025-26 season) 04/03/2024   Hemoglobin A1C  04/27/2024   Hepatitis C Screening  10/25/2024*   Yearly kidney function blood test for diabetes  10/25/2024   Yearly kidney health urinalysis for diabetes  10/25/2024   Meningitis B Vaccine  Aged Out   DTaP/Tdap/Td vaccine  Discontinued   Pneumococcal Vaccine for age over 89  Discontinued   Flu Shot  Discontinued   Zoster  (Shingles) Vaccine  Discontinued  *Topic was postponed. The date shown is not the original due date.       05/08/2024    9:56 AM  Advanced Directives  Does Patient Have a Medical Advance Directive? Yes  Type of Advance Directive Living will  Copy of Healthcare Power of Attorney in Chart? No - copy requested   Advance Care Planning is important because it: Ensures you receive medical care that aligns with your values, goals, and preferences. Provides guidance to your family and loved ones, reducing the emotional burden of decision-making during critical moments.  Vision: Annual vision screenings are recommended for early detection of glaucoma, cataracts, and diabetic retinopathy. These exams can also reveal signs of chronic conditions such as diabetes and high blood pressure.  Dental: Annual dental screenings help detect early signs of oral cancer, gum disease, and other conditions linked to overall health, including heart disease and diabetes.  Please see the attached documents for additional preventive care recommendations.

## 2024-05-08 NOTE — Progress Notes (Signed)
 Subjective:   Ethan Pierce is a 78 y.o. who presents for a Medicare Wellness preventive visit.  As a reminder, Annual Wellness Visits don't include a physical exam, and some assessments may be limited, especially if this visit is performed virtually. We may recommend an in-person follow-up visit with your provider if needed.  Visit Complete: Virtual I connected with  Ethan Pierce on 05/08/24 by a video and audio enabled telemedicine application and verified that I am speaking with the correct person using two identifiers.  Patient Location: Home  Provider Location: Home Office  I discussed the limitations of evaluation and management by telemedicine. The patient expressed understanding and agreed to proceed.  Vital Signs: Because this visit was a virtual/telehealth visit, some criteria may be missing or patient reported. Any vitals not documented were not able to be obtained and vitals that have been documented are patient reported.  Persons Participating in Visit: Patient.  AWV Questionnaire: No: Patient Medicare AWV questionnaire was not completed prior to this visit.  Cardiac Risk Factors include: advanced age (>6men, >47 women);hypertension;male gender;diabetes mellitus;dyslipidemia     Objective:    Today's Vitals   05/08/24 0935 05/08/24 0941  Weight: 157 lb (71.2 kg)   Height: 5' 8 (1.727 m)   PainSc:  7    Body mass index is 23.87 kg/m.     05/08/2024    9:56 AM 02/15/2023    7:41 AM 06/25/2019    5:19 PM 06/03/2019    8:36 PM 06/03/2019   12:23 PM 06/01/2019    9:18 PM 05/31/2019    6:38 PM  Advanced Directives  Does Patient Have a Medical Advance Directive? Yes Yes No No No No No  Type of Advance Directive Living will        Does patient want to make changes to medical advance directive?  No - Patient declined       Copy of Healthcare Power of Attorney in Chart? No - copy requested        Would patient like information on creating a medical advance  directive?   No - Patient declined No - Patient declined No - Patient declined      Current Medications (verified) Outpatient Encounter Medications as of 05/08/2024  Medication Sig   albuterol  (VENTOLIN  HFA) 108 (90 Base) MCG/ACT inhaler Inhale 2 puffs into the lungs every 6 (six) hours as needed for wheezing or shortness of breath.   amLODipine  (NORVASC ) 10 MG tablet Take 1 tablet (10 mg total) by mouth every morning, for blood pressure.   azithromycin  (ZITHROMAX ) 200 MG/5ML suspension Take 12 ml by mouth on day 1, then 6ml by mouth on day 2-5. Discard remainder.   blood glucose meter kit and supplies KIT Check blood sugar three times daily   calcipotriene  (DOVONOX) 0.005 % cream APPLY SMALL AMOUNT TOPICALLY 3 TIMES A DAY AS NEEDED FOR FEET   Calcipotriene  0.005 % solution Apply topically.   diclofenac Sodium (VOLTAREN) 1 % GEL APPLY 4 GRAMS TOPICALLY FOUR TIMES A DAY AS NEEDED FOR HIP PAIN   fluticasone (FLONASE) 50 MCG/ACT nasal spray 1 spray in each nostril   glucose blood (ONETOUCH VERIO) test strip Check blood sugar three times daily   lansoprazole (PREVACID SOLUTAB) 15 MG disintegrating tablet Take by mouth.   lidocaine (XYLOCAINE) 5 % ointment Apply topically.   LIFESCAN FINEPOINT LANCETS MISC Check blood sugar three times daily   loratadine  (CLARITIN ) 10 MG tablet Take 10 mg by mouth.   losartan (  COZAAR) 25 MG tablet Take 25 mg by mouth.   meclizine (ANTIVERT) 25 MG tablet 1 tablet as needed   melatonin 3 MG TABS tablet Take 3 mg by mouth.   metoprolol  tartrate (LOPRESSOR ) 25 MG tablet Take 1 tablet (25 mg total) by mouth 2 (two) times daily.   Polyvinyl Alcohol-Povidone PF 1.4-0.6 % SOLN Apply to eye.   promethazine -dextromethorphan (PROMETHAZINE -DM) 6.25-15 MG/5ML syrup Take 5 mLs by mouth 4 (four) times daily as needed for cough.   valsartan -hydrochlorothiazide  (DIOVAN -HCT) 160-12.5 MG tablet Take 1 tablet by mouth daily, for blood pressure.   valsartan  (DIOVAN ) 160 MG tablet  Take 160 mg by mouth daily. for high blood pressure (Patient not taking: Reported on 05/08/2024)   No facility-administered encounter medications on file as of 05/08/2024.    Allergies (verified) Penicillin g, Penicillins, Trazodone  and nefazodone, Ativan  [lorazepam ], Atorvastatin, Decongestant [pseudoephedrine hcl er], Flunisolide, Lactose, Lisinopril , and Talwin [pentazocine]   History: Past Medical History:  Diagnosis Date   Bronchitis    Diabetes mellitus without complication (HCC)    GERD (gastroesophageal reflux disease)    History of 2019 novel coronavirus disease (COVID-19) 05/2019   Hypertension    Past Surgical History:  Procedure Laterality Date   CATARACT EXTRACTION, BILATERAL  2018, 2019   WISDOM TOOTH EXTRACTION  05/2019   Family History  Problem Relation Age of Onset   Diabetes Mother    GER disease Mother    Cancer Father        pancreas   Diabetes Sister    Diabetes Brother    Social History   Socioeconomic History   Marital status: Married    Spouse name: Sherri   Number of children: 2   Years of education: Not on file   Highest education level: Doctorate  Occupational History   Occupation: RETIRED  Tobacco Use   Smoking status: Never   Smokeless tobacco: Never  Vaping Use   Vaping status: Never Used  Substance and Sexual Activity   Alcohol use: No   Drug use: No   Sexual activity: Yes  Other Topics Concern   Not on file  Social History Narrative   Lives with wife   Social Drivers of Health   Financial Resource Strain: Low Risk  (05/08/2024)   Overall Financial Resource Strain (CARDIA)    Difficulty of Paying Living Expenses: Not hard at all  Food Insecurity: No Food Insecurity (05/08/2024)   Hunger Vital Sign    Worried About Running Out of Food in the Last Year: Never true    Ran Out of Food in the Last Year: Never true  Transportation Needs: No Transportation Needs (05/08/2024)   PRAPARE - Administrator, Civil Service  (Medical): No    Lack of Transportation (Non-Medical): No  Physical Activity: Inactive (05/08/2024)   Exercise Vital Sign    Days of Exercise per Week: 0 days    Minutes of Exercise per Session: 0 min  Stress: No Stress Concern Present (05/08/2024)   [name] of Occupational Health - Occupational Stress Questionnaire    Feeling of Stress: Not at all  Social Connections: Socially Integrated (05/08/2024)   Social Connection and Isolation Panel    Frequency of Communication with Friends and Family: More than three times a week    Frequency of Social Gatherings with Friends and Family: More than three times a week    Attends Religious Services: More than 4 times per year    Active Member of [employer] or Organizations:  Yes    Attends Banker Meetings: More than 4 times per year    Marital Status: Married    Tobacco Counseling Counseling given: Not Answered    Clinical Intake:  Pre-visit preparation completed: Yes  Pain : 0-10 Pain Score: 7  Pain Type: Chronic pain Pain Location: Hip (both feet/Peripheral neuropathy) Pain Orientation: Left Pain Descriptors / Indicators: Discomfort, Aching, Numbness, Tingling Pain Onset: More than a month ago Pain Frequency: Constant Pain Relieving Factors: Foot cream and pain pill-per pt  Pain Relieving Factors: Foot cream and pain pill-per pt  BMI - recorded: 23.87 Nutritional Status: BMI of 19-24  Normal Nutritional Risks: None Diabetes: Yes CBG done?: No Did pt. bring in CBG monitor from home?: No  Lab Results  Component Value Date   HGBA1C 7.1 (A) 10/26/2023   HGBA1C 8.1 (H) 06/04/2019     How often do you need to have someone help you when you read instructions, pamphlets, or other written materials from your doctor or pharmacy?: 1 - Never  Interpreter Needed?: No  Information entered by :: Perian Tedder, RMA   Activities of Daily Living 2    05/08/2024    9:46 AM  In your present state of health, do you  have any difficulty performing the following activities:  Hearing? 1  Comment Wears hearing aides  Vision? 0  Difficulty concentrating or making decisions? 0  Walking or climbing stairs? 0  Dressing or bathing? 0  Doing errands, shopping? 0  Preparing Food and eating ? N  Using the Toilet? N  In the past six months, have you accidently leaked urine? Y  Do you have problems with loss of bowel control? N  Managing your Medications? N  Managing your Finances? N  Housekeeping or managing your Housekeeping? N    Patient Care Team: Tanda Bleacher, MD as PCP - General (Family Medicine)  I have updated your Care Teams any recent Medical Services you may have received from other providers in the past year.     Assessment:   This is a routine wellness examination for [facility].  Hearing/Vision screen Hearing Screening - Comments:: Wears hearing aides Vision Screening - [facility]:: [facility]   Goals Addressed   None    Depression Screen     05/08/2024    9:59 AM 10/26/2023    1:46 PM 01/26/2023    1:16 PM 08/20/2022    1:27 PM 08/20/2022    1:14 PM  PHQ 2/9 Scores  PHQ - 2 Score 0 0 0 0 0  PHQ- 9 Score 0  0 0 0    Fall Risk     05/08/2024    9:56 AM 01/19/2024    2:30 PM 10/26/2023    1:47 PM  Fall Risk   Falls in the past year? 1 1 1   Number falls in past yr: 1 1 1   Injury with Fall? 1 0 1  Risk for fall due to :  Other (Comment) History of fall(s);Impaired balance/gait  Risk for fall due to: Comment  DM patient gets dizzy sometimes  Follow up Falls evaluation completed;Falls prevention discussed  Falls evaluation completed;Falls prevention discussed    MEDICARE RISK AT HOME:  Medicare Risk at Home Any stairs in or around the home?: Yes (lift) If so, are there any without handrails?: No Home free of loose throw rugs in walkways, pet beds, electrical cords, etc?: Yes Adequate lighting in your home to reduce risk of falls?: Yes Life alert?: Yes (  doesn't  wear it-per pt) Use of a cane, walker or w/c?: Yes Grab bars in the bathroom?: Yes Shower chair or bench in shower?: Yes Elevated toilet seat or a handicapped toilet?: Yes  TIMED UP AND GO:  Was the test performed?  No  Cognitive Function: Declined/Normal: No cognitive concerns noted by patient or family. Patient alert, oriented, able to answer questions appropriately and recall recent events. No signs of memory loss or confusion.        Immunizations Immunization History  Administered Date(s) Administered   Fluad Quad(high Dose 65+) 07/13/2019, 05/07/2021   Fluad Trivalent(High Dose 65+) 04/28/2023   H1N1 06/12/2008   Influenza-Unspecified 05/03/2005, 06/16/2006, 07/12/2007, 04/16/2008, 05/03/2008   Janssen (J&J) SARS-COV-2 Vaccination 11/01/2019   Pneumococcal Polysaccharide-23 08/04/2003   Pneumococcal-Unspecified 05/03/2004   Tdap 08/03/2010    Screening Tests Health Maintenance  Topic Date Due   Medicare Annual Wellness (AWV)  Never done   FOOT EXAM  09/19/2022   OPHTHALMOLOGY EXAM  10/12/2023   COVID-19 Vaccine (2 - 2025-26 season) 04/03/2024   HEMOGLOBIN A1C  04/27/2024   Hepatitis C Screening  10/25/2024 (Originally 02/11/1964)   Diabetic kidney evaluation - eGFR measurement  10/25/2024   Diabetic kidney evaluation - Urine ACR  10/25/2024   Meningococcal B Vaccine  Aged Out   DTaP/Tdap/Td  Discontinued   Pneumococcal Vaccine: 50+ Years  Discontinued   Influenza Vaccine  Discontinued   Zoster Vaccines- Shingrix  Discontinued    Health Maintenance Items Addressed: Diabetic Foot Exam recommended, Labs Due A1c check, See Nurse Notes at the end of this note  Additional Screening:  Vision Screening: Recommended annual ophthalmology exams for early detection of glaucoma and other disorders of the eye. Is the patient up to date with their annual eye exam?  No  Who is the provider or what is the [facility] of the [facility] which the patient [facility] [place]  Dental Screening: Recommended annual dental exams for proper oral hygiene  Community Resource Referral / Chronic Care Management: CRR required this visit?  No   CCM required this visit?  No   Plan:    I have personally reviewed and noted the following in the patient's chart:   Medical and social history Use of alcohol, tobacco or illicit drugs  Current medications and supplements including opioid prescriptions. Patient is not currently taking opioid prescriptions. Functional ability and status Nutritional status Physical activity Advanced directives List of other physicians Hospitalizations, surgeries, and ER visits in previous 12 months Vitals Screenings to include cognitive, depression, and falls Referrals and appointments  In addition, I have reviewed and discussed with patient certain preventive protocols, quality metrics, and best practice recommendations. A written personalized care plan for preventive services as well as general preventive health recommendations were provided to patient.   Ethan Pierce, CMA   05/08/2024   After Visit Summary: (MyChart) Due to this being a telephonic visit, the after visit summary with patients personalized plan was offered to patient via MyChart   Notes: Patient is due for a foot exam and a A1c check, which can be done during his next office visit with provider.  Patient stated that he has had a recent eye exam at the [facility] in [place]. I have sent a request for eye exam record out today.  He had no other concerns to address today.

## 2024-05-13 ENCOUNTER — Ambulatory Visit: Admission: EM | Admit: 2024-05-13 | Discharge: 2024-05-13 | Disposition: A | Discharge status: Home / Self Care

## 2024-05-13 DIAGNOSIS — H8109 Meniere's disease, unspecified ear: Secondary | ICD-10-CM | POA: Insufficient documentation

## 2024-05-13 DIAGNOSIS — J069 Acute upper respiratory infection, unspecified: Secondary | ICD-10-CM

## 2024-05-13 DIAGNOSIS — F4310 Post-traumatic stress disorder, unspecified: Secondary | ICD-10-CM | POA: Insufficient documentation

## 2024-05-13 DIAGNOSIS — J329 Chronic sinusitis, unspecified: Secondary | ICD-10-CM | POA: Insufficient documentation

## 2024-05-13 DIAGNOSIS — I10 Essential (primary) hypertension: Secondary | ICD-10-CM | POA: Insufficient documentation

## 2024-05-13 DIAGNOSIS — Z713 Dietary counseling and surveillance: Secondary | ICD-10-CM | POA: Insufficient documentation

## 2024-05-13 DIAGNOSIS — G629 Polyneuropathy, unspecified: Secondary | ICD-10-CM | POA: Insufficient documentation

## 2024-05-13 DIAGNOSIS — H903 Sensorineural hearing loss, bilateral: Secondary | ICD-10-CM | POA: Insufficient documentation

## 2024-05-13 DIAGNOSIS — K0253 Dental caries on pit and fissure surface penetrating into pulp: Secondary | ICD-10-CM | POA: Insufficient documentation

## 2024-05-13 DIAGNOSIS — H40003 Preglaucoma, unspecified, bilateral: Secondary | ICD-10-CM | POA: Insufficient documentation

## 2024-05-13 DIAGNOSIS — E1169 Type 2 diabetes mellitus with other specified complication: Secondary | ICD-10-CM | POA: Insufficient documentation

## 2024-05-13 DIAGNOSIS — H547 Unspecified visual loss: Secondary | ICD-10-CM | POA: Insufficient documentation

## 2024-05-13 DIAGNOSIS — Z8616 Personal history of COVID-19: Secondary | ICD-10-CM | POA: Insufficient documentation

## 2024-05-13 DIAGNOSIS — E559 Vitamin D deficiency, unspecified: Secondary | ICD-10-CM | POA: Insufficient documentation

## 2024-05-13 DIAGNOSIS — F339 Major depressive disorder, recurrent, unspecified: Secondary | ICD-10-CM | POA: Insufficient documentation

## 2024-05-13 DIAGNOSIS — R3129 Other microscopic hematuria: Secondary | ICD-10-CM | POA: Insufficient documentation

## 2024-05-13 DIAGNOSIS — G43919 Migraine, unspecified, intractable, without status migrainosus: Secondary | ICD-10-CM | POA: Insufficient documentation

## 2024-05-13 DIAGNOSIS — Z461 Encounter for fitting and adjustment of hearing aid: Secondary | ICD-10-CM | POA: Insufficient documentation

## 2024-05-13 DIAGNOSIS — K08131 Complete loss of teeth due to caries, class I: Secondary | ICD-10-CM | POA: Insufficient documentation

## 2024-05-13 DIAGNOSIS — E1140 Type 2 diabetes mellitus with diabetic neuropathy, unspecified: Secondary | ICD-10-CM | POA: Insufficient documentation

## 2024-05-13 MED ORDER — ALBUTEROL SULFATE (2.5 MG/3ML) 0.083% IN NEBU
2.5000 mg | INHALATION_SOLUTION | Freq: Once | RESPIRATORY_TRACT | Status: AC
  Administered 2024-05-13: 2.5 mg via RESPIRATORY_TRACT

## 2024-05-13 NOTE — ED Triage Notes (Signed)
 Patient reports typically requiring a breathing treatment around this time of year. They use an inhaler, nasal spray, and cough syrup (as needed), but note that the cough has been worsening since Monday. There is no fever, and they have mild runny nose and congestion.

## 2024-05-13 NOTE — ED Provider Notes (Signed)
 [facility]    CSN: [id number] Arrival date & time: 05/13/24  0900      History   Chief Complaint Chief Complaint  Patient presents with   Cough    HPI Ethan Pierce is a 78 y.o. male.   Patient here concerned with cough times several days.  He is requesting a breathing treatment.  States about this time of year he requires breathing treatment every since having COVID-pneumonia approximately 4 to 5 years ago.  He denies fevers chills nasal congestion and rhinorrhea wheezing shortness of breath nausea vomiting diarrhea.  His wife sick with upper respiratory like illness.    Past Medical History:  Diagnosis Date   Bronchitis    Bunionette of left foot 09/19/2021   Cataract 09/19/2021   Chronic post-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 09/19/2021   COVID-19 12/19/2021   Dental caries on pit and fissure surface penetrating into pulp 01/26/2023   Dental caries on smooth surface penetrating into dentin 01/26/2023   Diabetes mellitus without complication (HCC)    Disorder of refraction and accommodation 12/19/2021   Diverticulosis of colon 09/09/2012   Encounter for fitting and adjustment of spectacles and contact lenses 09/19/2021   Essential hypertension 06/05/2019   GERD (gastroesophageal reflux disease)    History of 2019 novel coronavirus disease (COVID-19) 05/2019   History of 2019 novel coronavirus disease (COVID-19) 09/19/2021   Hypertension    Legal blindness, as defined in [place] 12/19/2021   Oct 01, 2008 Entered By: AUSTON PAC S Comment: OU b/o glaucoma     Onycholysis 09/19/2021   Pneumonia due to COVID-19 virus 06/03/2019   Sleep apnea 09/19/2021    Patient Active Problem List   Diagnosis Date Noted   Complete loss of teeth due to caries, class I 05/13/2024   Microscopic hematuria 05/13/2024   Encounter for fitting and adjustment of hearing aid 05/13/2024   Neuropathy due to type 2 diabetes mellitus (HCC) 05/13/2024   Post-traumatic  stress disorder, unspecified 05/13/2024   Preglaucoma, unspecified, bilateral 05/13/2024   Posttraumatic stress disorder 05/13/2024   Recurrent sinusitis 05/13/2024   Vitamin D deficiency 05/13/2024   Dental caries on pit and fissure surface penetrating into pulp 05/13/2024   Type 2 diabetes mellitus with other specified complication (HCC) 05/13/2024   Visual impairment 05/13/2024   Dietary counseling and surveillance 05/13/2024   HTN (hypertension) 05/13/2024   History of severe acute respiratory syndrome coronavirus 2 (SARS-CoV-2) disease 05/13/2024   Recurrent major depression 05/13/2024   Sensorineural hearing loss, bilateral 05/13/2024   Meniere's disease 05/13/2024   Refractory migraine 05/13/2024   Peripheral nerve disease 05/13/2024   Pain in joint of left shoulder 12/24/2023   Muscle weakness (generalized) 01/26/2023   Diabetes mellitus type 2, controlled, without complications (HCC) 08/20/2022   Exposure to potentially hazardous substance 08/20/2022   Major depressive disorder 08/20/2022   Migraine 08/20/2022   Nightmare 08/20/2022   Onychomycosis due to dermatophyte 08/20/2022   Open angle with borderline findings, high risk, right eye 08/20/2022   Pain of left hip joint 08/20/2022   Problem related to unspecified psychosocial circumstances 08/20/2022   Vertigo 08/20/2022   Allergic rhinitis 12/19/2021   Anemia 12/19/2021   Anxiety 12/19/2021   Enthesopathy of ankle and tarsus 12/19/2021   Erectile dysfunction 12/19/2021   Hyperlipidemia 12/19/2021   Low back pain 12/19/2021   Major depressive disorder, recurrent, moderate (HCC) 12/19/2021   Meniere's disease, bilateral 12/19/2021   Hearing loss in right ear 11/27/2021   Meniere's disease of  right ear 11/27/2021   Peripheral neuropathy 09/19/2021   Plantar fascial fibromatosis 09/19/2021   Abnormal gait 09/19/2021   Chronic fatigue syndrome 09/19/2021   Sinusitis, acute maxillary 01/08/2021   Diabetes mellitus  type 2, uncontrolled, with complications 06/05/2019   Type 2 diabetes mellitus (HCC) 06/03/2019   Fall 06/03/2019   Lumbar radiculopathy 03/03/2018   Hypertension associated with diabetes (HCC) 08/08/2012   GERD (gastroesophageal reflux disease) 08/08/2012    Past Surgical History:  Procedure Laterality Date   CATARACT EXTRACTION, BILATERAL  2018, 2019   WISDOM TOOTH EXTRACTION  05/2019       Home Medications    Prior to Admission medications   Medication Sig Start Date End Date Taking? Authorizing Provider  albuterol  (VENTOLIN  HFA) 108 (90 Base) MCG/ACT inhaler Inhale 2 puffs into the lungs every 6 (six) hours as needed for wheezing or shortness of breath. 06/05/22  Yes Raspet, Erin K, PA-C  amLODipine  (NORVASC ) 10 MG tablet Take 1 tablet (10 mg total) by mouth every morning, for blood pressure. 04/28/23  Yes Tanda Bleacher, MD  cyanocobalamin (VITAMIN B12) 1000 MCG/ML injection Inject 1,000 mcg into the muscle every 30 (thirty) days. 09/05/18  Yes [provider]  dapagliflozin propanediol (FARXIGA) 5 MG TABS tablet Take 5 mg by mouth daily. 05/25/19  Yes [provider]  fluticasone (FLONASE) 50 MCG/ACT nasal spray 1 spray in each nostril 09/03/20  Yes [provider]  glimepiride (AMARYL) 4 MG tablet Take 4 mg by mouth daily with breakfast. 07/08/15  Yes [provider]  L-Methylfolate-Algae-B12-B6 LEDON) 3-90.314-2-35 MG CAPS 1 capsule. 05/25/19  Yes [provider]  losartan (COZAAR) 25 MG tablet Take 25 mg by mouth. 04/07/21  Yes [provider]  metoprolol  tartrate (LOPRESSOR ) 25 MG tablet Take 1 tablet (25 mg total) by mouth 2 (two) times daily. 04/28/23  Yes Tanda Bleacher, MD  molnupiravir  EUA (LAGEVRIO ) 200 MG CAPS capsule Take 4 capsules by mouth 2 (two) times daily. 01/22/22  Yes [provider]  pioglitazone (ACTOS) 15 MG tablet Take 15 mg by mouth daily. 03/23/16  Yes [provider]  Pitavastatin Calcium   (LIVALO) 4 MG TABS daily. 06/30/16  Yes [provider]  pravastatin (PRAVACHOL) 10 MG tablet Take 10 mg by mouth daily. 02/27/19  Yes [provider]  promethazine  (PHENERGAN ) 25 MG tablet Take 25 mg by mouth every 6 (six) hours as needed. 05/25/19  Yes [provider]  promethazine -dextromethorphan (PROMETHAZINE -DM) 6.25-15 MG/5ML syrup Take 5 mLs by mouth 4 (four) times daily as needed for cough. 09/22/23  Yes Piontek, Erin, MD  sitaGLIPtin (JANUVIA) 50 MG tablet Take 50 mg by mouth daily. 02/27/19  Yes [provider]  venlafaxine XR (EFFEXOR-XR) 75 MG 24 hr capsule Take 75 mg by mouth daily with breakfast. 10/31/18  Yes [provider]  azithromycin  (ZITHROMAX ) 200 MG/5ML suspension Take 12 ml by mouth on day 1, then 6ml by mouth on day 2-5. Discard remainder. 09/22/23   Piontek, Rocky, MD  bimatoprost (LUMIGAN) 0.01 % SOLN 1 drop at bedtime.    [provider]  blood glucose meter kit and supplies KIT Check blood sugar three times daily 04/23/23     Butalbital-APAP-Caffeine 50-325-40 MG capsule Take 1 capsule by mouth every 4 (four) hours as needed.    [provider]  calcipotriene  (DOVONOX) 0.005 % cream APPLY SMALL AMOUNT TOPICALLY 3 TIMES A DAY AS NEEDED FOR FEET 07/07/21   [provider]  Calcipotriene  0.005 % solution Apply topically.  10/07/18   [provider]  citalopram (CELEXA) 40 MG tablet Take 40 mg by mouth daily.    [provider]  diclofenac Sodium (VOLTAREN) 1 % GEL APPLY 4 GRAMS TOPICALLY FOUR TIMES A DAY AS NEEDED FOR HIP PAIN 02/13/21   [provider]  glucose blood (ONETOUCH VERIO) test strip Check blood sugar three times daily 04/23/23     lactulose (CEPHULAC) 10 g packet Take 1 packet by mouth daily at 2 PM.    [provider]  lansoprazole (PREVACID SOLUTAB) 15 MG disintegrating tablet Take by mouth. 02/13/21   [provider]  lidocaine (XYLOCAINE) 5 % ointment  Apply topically. 11/17/23   [provider]  [facility] FINEPOINT LANCETS MISC Check blood sugar three times daily 04/23/23     loratadine  (CLARITIN ) 10 MG tablet Take 10 mg by mouth. 11/17/23   [provider]  meclizine (ANTIVERT) 25 MG tablet 1 tablet as needed 11/27/21   [provider]  melatonin 3 MG TABS tablet Take 3 mg by mouth. 11/27/21   [provider]  Nutritional Supplements (GLUCERNA PO) Orally    [provider]  oxybutynin (DITROPAN) 5 MG tablet Take 5 mg by mouth 2 (two) times daily.    [provider]  Polyethyl Glycol-Propyl Glycol (SYSTANE ULTRA) 0.4-0.3 % SOLN 1 drop into affected eye as needed Ophthalmic 24 time(s) a day    [provider]  Polyvinyl Alcohol-Povidone PF 1.4-0.6 % SOLN Apply to eye. 12/22/23   [provider]  prazosin (MINIPRESS) 1 MG capsule Take 1 mg by mouth at bedtime.    [provider]  sildenafil (VIAGRA) 100 MG tablet Take 100 mg by mouth as needed.    [provider]  terbinafine (LAMISIL) 1 % cream Apply 1 Application topically 2 (two) times daily.    [provider]  traZODone  (DESYREL ) 100 MG tablet Take 100 mg by mouth daily at 2 PM.    [provider]  triamterene-hydrochlorothiazide  (DYAZIDE) 37.5-25 MG capsule 1 capsule in the morning Orally Once a day    [provider]  urea  (CARMOL) 20 % cream 1 application to affected area as needed Externally Twice a day    [provider]  valsartan  (DIOVAN ) 160 MG tablet Take 160 mg by mouth daily. for high blood pressure Patient not taking: Reported on 05/08/2024 11/17/23   [provider]  valsartan -hydrochlorothiazide  (DIOVAN -HCT) 160-12.5 MG tablet Take 1 tablet by mouth daily, for blood pressure. 12/28/23   Tanda Bleacher, MD    Family History Family History  Problem Relation Age of Onset   Diabetes Mother    GER disease Mother    Cancer Father        pancreas    Diabetes Sister    Diabetes Brother     Social History Social History   Tobacco Use   Smoking status: Never    Passive exposure: Never   Smokeless tobacco: Never  Vaping Use   Vaping status: Never Used  Substance Use Topics   Alcohol use: No   Drug use: No     Allergies   Penicillins, Atorvastatin calcium , Lactose, Lorazepam , and Trazodone    Review of Systems Review of Systems  Constitutional:  Negative for chills, fatigue and fever.  HENT:  Negative for congestion, ear pain, nosebleeds, postnasal drip, rhinorrhea, sinus pressure, sinus pain and sore throat.   Eyes:  Negative for pain and redness.  Respiratory:  Positive for cough. Negative for shortness of breath  and wheezing.   Gastrointestinal:  Negative for abdominal pain, diarrhea, nausea and vomiting.  Musculoskeletal:  Negative for arthralgias and myalgias.  Skin:  Negative for rash.  Neurological:  Negative for light-headedness and headaches.  Hematological:  Negative for adenopathy. Does not bruise/bleed easily.  Psychiatric/Behavioral:  Negative for confusion and sleep disturbance.      Physical Exam Triage Vital Signs ED Triage Vitals  Encounter Vitals Group     BP 05/13/24 0919 (!) 171/80     Girls Systolic BP Percentile --      Girls Diastolic BP Percentile --      Boys Systolic BP Percentile --      Boys Diastolic BP Percentile --      Pulse Rate 05/13/24 0919 83     Resp 05/13/24 0919 18     Temp 05/13/24 0919 98.1 F (36.7 C)     Temp Source 05/13/24 0919 Oral     SpO2 05/13/24 0919 98 %     Weight 05/13/24 0916 162 lb (73.5 kg)     Height 05/13/24 0916 5' 8 (1.727 m)     Head Circumference --      Peak Flow --      Pain Score 05/13/24 0916 0     Pain Loc --      Pain Education --      Exclude from Growth Chart --    No data found.  Updated Vital Signs BP (!) 136/53 (BP Location: Other (Comment)) Comment (BP Location): Right forearm over single sleeve, per patient.  Pulse 83   Temp  98.1 F (36.7 C) (Oral)   Resp 18   Ht 5' 8 (1.727 m)   Wt 162 lb (73.5 kg)   SpO2 98%   BMI 24.63 kg/m   Visual Acuity Right Eye Distance:   Left Eye Distance:   Bilateral Distance:    Right Eye Near:   Left Eye Near:    Bilateral Near:     Physical Exam Vitals and nursing note reviewed.  Constitutional:      General: He is not in acute distress.    Appearance: Normal appearance. He is not ill-appearing.  HENT:     Head: Normocephalic and atraumatic.     Right Ear: Tympanic membrane and ear canal normal.     Left Ear: Tympanic membrane and ear canal normal.     Nose: No congestion or rhinorrhea.     Mouth/Throat:     Pharynx: No oropharyngeal exudate or posterior oropharyngeal erythema.  Eyes:     General: No scleral icterus.    Extraocular Movements: Extraocular movements intact.     Conjunctiva/sclera: Conjunctivae normal.  Cardiovascular:     Rate and Rhythm: Normal rate and regular rhythm.     Heart sounds: No murmur heard. Pulmonary:     Effort: Pulmonary effort is normal. No respiratory distress.     Breath sounds: Normal breath sounds. No wheezing or rales.  Musculoskeletal:        General: Normal range of motion.     Cervical back: Normal range of motion. No rigidity.  Lymphadenopathy:     Cervical: No cervical adenopathy.  Skin:    General: Skin is warm.     Capillary Refill: Capillary refill takes less than 2 seconds.     Coloration: Skin is not jaundiced.     Findings: No rash.  Neurological:     General: No focal deficit present.     Mental Status: He is alert  and oriented to person, place, and time.     Motor: No weakness.     Gait: Gait normal.  Psychiatric:        Mood and Affect: Mood normal.        Behavior: Behavior normal.      UC Treatments / Results  Labs (all labs ordered are listed, but only abnormal results are displayed) Labs Reviewed - No data to display  EKG   Radiology No results found.  Procedures Procedures  (including critical care time)  Medications Ordered in UC Medications  albuterol  (PROVENTIL ) (2.5 MG/3ML) 0.083% nebulizer solution 2.5 mg (2.5 mg Nebulization Given 05/13/24 0941)    Initial Impression / Assessment and Plan / UC Course  I have reviewed the triage vital signs and the nursing notes.  Pertinent labs & imaging results that were available during my care of the patient were reviewed by me and considered in my medical decision making (see chart for details).     Breathing treatment provided at patient request evaluation after breathing treatment unchanged lungs still remain clear to auscultation bilaterally and still with good air movement. Patient reports subjective improvement in symptoms after breathing treatment. Patient does not need refill of albuterol  inhaler. Follow-up with primary care return if symptoms worsen or fail to improve. Final Clinical Impressions(s) / UC Diagnoses   Final diagnoses:  Viral upper respiratory illness   Discharge Instructions   None    ED Prescriptions   None    PDMP not reviewed this encounter.   Juleen Rush, PA-C 05/13/24 1004

## 2024-05-13 NOTE — ED Notes (Signed)
 Patient declined COVID19 test.

## 2024-05-13 NOTE — ED Notes (Signed)
Treatment complete.

## 2024-06-14 DIAGNOSIS — G629 Polyneuropathy, unspecified: Secondary | ICD-10-CM | POA: Diagnosis not present

## 2024-06-14 DIAGNOSIS — R32 Unspecified urinary incontinence: Secondary | ICD-10-CM | POA: Diagnosis not present

## 2024-06-14 DIAGNOSIS — I10 Essential (primary) hypertension: Secondary | ICD-10-CM | POA: Diagnosis not present

## 2024-06-14 DIAGNOSIS — Z9989 Dependence on other enabling machines and devices: Secondary | ICD-10-CM | POA: Diagnosis not present

## 2024-06-14 DIAGNOSIS — E785 Hyperlipidemia, unspecified: Secondary | ICD-10-CM | POA: Diagnosis not present

## 2024-06-14 DIAGNOSIS — I700 Atherosclerosis of aorta: Secondary | ICD-10-CM | POA: Diagnosis not present

## 2024-06-14 DIAGNOSIS — R2689 Other abnormalities of gait and mobility: Secondary | ICD-10-CM | POA: Diagnosis not present

## 2024-06-14 DIAGNOSIS — Z880 Allergy status to penicillin: Secondary | ICD-10-CM | POA: Diagnosis not present

## 2024-06-14 DIAGNOSIS — K219 Gastro-esophageal reflux disease without esophagitis: Secondary | ICD-10-CM | POA: Diagnosis not present

## 2024-06-15 ENCOUNTER — Ambulatory Visit: Admitting: Podiatry

## 2024-06-15 ENCOUNTER — Encounter: Payer: Self-pay | Admitting: Podiatry

## 2024-06-15 DIAGNOSIS — B351 Tinea unguium: Secondary | ICD-10-CM

## 2024-06-15 DIAGNOSIS — M79674 Pain in right toe(s): Secondary | ICD-10-CM

## 2024-06-15 DIAGNOSIS — M79675 Pain in left toe(s): Secondary | ICD-10-CM | POA: Diagnosis not present

## 2024-06-15 DIAGNOSIS — E1142 Type 2 diabetes mellitus with diabetic polyneuropathy: Secondary | ICD-10-CM | POA: Diagnosis not present

## 2024-06-15 NOTE — Progress Notes (Signed)
 This patient returns to my office for at risk foot care.  This patient requires this care by a professional since this patient will be at risk due to having diabetes with neuropathy. This patient is unable to cut nails himself since the patient cannot reach his nails.These nails are painful walking and wearing shoes.  This patient presents for at risk foot care today.  General Appearance  Alert, conversant and in no acute stress.  Vascular  Dorsalis pedis and posterior tibial  pulses are palpable  bilaterally.  Capillary return is within normal limits  bilaterally. Temperature is within normal limits  bilaterally.  Neurologic  Senn-Weinstein monofilament wire test diminished  bilaterally. Muscle power within normal limits bilaterally.  Nails Thick disfigured discolored nails with subungual debris  from hallux to fifth toes bilaterally. No evidence of bacterial infection or drainage bilaterally.  Orthopedic  No limitations of motion  feet .  No crepitus or effusions noted.  Pes planus  B/L.  Tailors bunion.  Skin  normotropic skin with no porokeratosis noted bilaterally.  No signs of infections or ulcers noted.     Onychomycosis  Pain in right toes  Pain in left toes  Consent was obtained for treatment procedures.   Mechanical debridement of nails 1-5  bilaterally performed with a nail nipper.  Filed with dremel without incident.    Return office visit      3 months                Told patient to return for periodic foot care and evaluation due to potential at risk complications.   Cordella Bold DPM tomma

## 2024-07-06 ENCOUNTER — Other Ambulatory Visit: Payer: Self-pay | Admitting: Family Medicine

## 2024-07-06 NOTE — Telephone Encounter (Unsigned)
 Copied from CRM #[id number]. Topic: Clinical - Medication Refill >> Jul 06, 2024  2:09 PM Nathanel C wrote: Medication: valsartan -hydrochlorothiazide  (DIOVAN -HCT) 160-12.5 MG tablet   Has the patient contacted their pharmacy? No  This is the patient's preferred pharmacy:  [facility] [address] [place] [address] Phone: [phone] Fax: [phone]  Is this the correct pharmacy for this prescription? Yes If no, delete pharmacy and type the correct one.   Has the prescription been filled recently? Yes  Is the patient out of the medication? Yes  Has the patient been seen for an appointment in the last year OR does the patient have an upcoming appointment? Yes  Can we respond through MyChart? Yes  Agent: Please be advised that Rx refills may take up to 3 business days. We ask that you follow-up with your pharmacy.

## 2024-07-07 ENCOUNTER — Other Ambulatory Visit: Payer: Self-pay | Admitting: Family Medicine

## 2024-07-07 ENCOUNTER — Other Ambulatory Visit (HOSPITAL_COMMUNITY): Payer: Self-pay

## 2024-07-08 ENCOUNTER — Other Ambulatory Visit (HOSPITAL_COMMUNITY): Payer: Self-pay

## 2024-07-10 ENCOUNTER — Other Ambulatory Visit (HOSPITAL_COMMUNITY): Payer: Self-pay

## 2024-07-10 MED ORDER — VALSARTAN-HYDROCHLOROTHIAZIDE 160-12.5 MG PO TABS
1.0000 | ORAL_TABLET | Freq: Every day | ORAL | 1 refills | Status: AC
  Filled 2024-07-10 (×2): qty 90, 90d supply, fill #0

## 2024-09-04 ENCOUNTER — Ambulatory Visit: Admitting: Family Medicine

## 2024-09-11 ENCOUNTER — Ambulatory Visit: Admitting: Family Medicine

## 2024-09-14 ENCOUNTER — Ambulatory Visit: Admitting: Podiatry

## 2024-10-25 ENCOUNTER — Encounter: Admitting: Family Medicine
